# Patient Record
Sex: Female | Born: 1950
Health system: Southern US, Community
[De-identification: ages and names within clinical notes are randomized; demographics above are authoritative.]

## PROBLEM LIST (undated history)

## (undated) DIAGNOSIS — Z9989 Dependence on other enabling machines and devices: Secondary | ICD-10-CM

## (undated) DIAGNOSIS — Z950 Presence of cardiac pacemaker: Secondary | ICD-10-CM

## (undated) DIAGNOSIS — Z8489 Family history of other specified conditions: Secondary | ICD-10-CM

## (undated) DIAGNOSIS — G510 Bell's palsy: Secondary | ICD-10-CM

## (undated) DIAGNOSIS — G43909 Migraine, unspecified, not intractable, without status migrainosus: Secondary | ICD-10-CM

## (undated) DIAGNOSIS — M1990 Unspecified osteoarthritis, unspecified site: Secondary | ICD-10-CM

## (undated) DIAGNOSIS — R55 Syncope and collapse: Secondary | ICD-10-CM

## (undated) DIAGNOSIS — G4733 Obstructive sleep apnea (adult) (pediatric): Secondary | ICD-10-CM

## (undated) DIAGNOSIS — K219 Gastro-esophageal reflux disease without esophagitis: Secondary | ICD-10-CM

## (undated) DIAGNOSIS — I5032 Chronic diastolic (congestive) heart failure: Secondary | ICD-10-CM

## (undated) DIAGNOSIS — Z8719 Personal history of other diseases of the digestive system: Secondary | ICD-10-CM

## (undated) DIAGNOSIS — I10 Essential (primary) hypertension: Secondary | ICD-10-CM

## (undated) DIAGNOSIS — I442 Atrioventricular block, complete: Secondary | ICD-10-CM

## (undated) DIAGNOSIS — B029 Zoster without complications: Secondary | ICD-10-CM

## (undated) DIAGNOSIS — Z87442 Personal history of urinary calculi: Secondary | ICD-10-CM

## (undated) DIAGNOSIS — E039 Hypothyroidism, unspecified: Secondary | ICD-10-CM

## (undated) HISTORY — PX: DILATION AND CURETTAGE OF UTERUS: SHX78

## (undated) HISTORY — PX: BACK SURGERY: SHX140

## (undated) HISTORY — PX: LITHOTRIPSY: SUR834

## (undated) HISTORY — PX: PARATHYROIDECTOMY: SHX19

## (undated) HISTORY — PX: JOINT REPLACEMENT: SHX530

## (undated) HISTORY — PX: TUBAL LIGATION: SHX77

## (undated) HISTORY — PX: LAPAROSCOPIC CHOLECYSTECTOMY: SUR755

## (undated) HISTORY — PX: CARDIAC CATHETERIZATION: SHX172

## (undated) HISTORY — PX: LUMBAR DISC SURGERY: SHX700

## (undated) HISTORY — PX: BREAST BIOPSY: SHX20

## (undated) HISTORY — PX: INSERT / REPLACE / REMOVE PACEMAKER: SUR710

---

## 1994-08-03 HISTORY — PX: VAGINAL HYSTERECTOMY: SUR661

## 1998-09-27 ENCOUNTER — Encounter: Payer: Self-pay | Admitting: Emergency Medicine

## 1998-09-27 ENCOUNTER — Emergency Department (HOSPITAL_COMMUNITY): Admission: EM | Admit: 1998-09-27 | Discharge: 1998-09-27 | Payer: Self-pay | Admitting: Emergency Medicine

## 1999-03-11 ENCOUNTER — Encounter: Payer: Self-pay | Admitting: Internal Medicine

## 1999-03-11 ENCOUNTER — Ambulatory Visit (HOSPITAL_COMMUNITY): Admission: RE | Admit: 1999-03-11 | Discharge: 1999-03-11 | Payer: Self-pay | Admitting: Internal Medicine

## 2000-03-05 ENCOUNTER — Emergency Department (HOSPITAL_COMMUNITY): Admission: EM | Admit: 2000-03-05 | Discharge: 2000-03-05 | Payer: Self-pay | Admitting: Emergency Medicine

## 2001-10-27 ENCOUNTER — Emergency Department (HOSPITAL_COMMUNITY): Admission: EM | Admit: 2001-10-27 | Discharge: 2001-10-27 | Payer: Self-pay | Admitting: Emergency Medicine

## 2001-10-27 ENCOUNTER — Encounter: Payer: Self-pay | Admitting: Emergency Medicine

## 2002-08-04 ENCOUNTER — Other Ambulatory Visit (HOSPITAL_COMMUNITY): Admission: RE | Admit: 2002-08-04 | Discharge: 2002-08-25 | Payer: Self-pay | Admitting: *Deleted

## 2002-10-10 ENCOUNTER — Emergency Department (HOSPITAL_COMMUNITY): Admission: EM | Admit: 2002-10-10 | Discharge: 2002-10-10 | Payer: Self-pay | Admitting: Emergency Medicine

## 2002-10-10 ENCOUNTER — Encounter: Payer: Self-pay | Admitting: Emergency Medicine

## 2004-01-17 ENCOUNTER — Encounter: Admission: RE | Admit: 2004-01-17 | Discharge: 2004-01-17 | Payer: Self-pay | Admitting: Internal Medicine

## 2004-02-14 ENCOUNTER — Encounter (INDEPENDENT_AMBULATORY_CARE_PROVIDER_SITE_OTHER): Payer: Self-pay | Admitting: *Deleted

## 2004-02-14 ENCOUNTER — Encounter: Admission: RE | Admit: 2004-02-14 | Discharge: 2004-02-14 | Payer: Self-pay | Admitting: Internal Medicine

## 2004-03-07 ENCOUNTER — Encounter: Admission: RE | Admit: 2004-03-07 | Discharge: 2004-03-07 | Payer: Self-pay | Admitting: Internal Medicine

## 2004-03-07 ENCOUNTER — Encounter (INDEPENDENT_AMBULATORY_CARE_PROVIDER_SITE_OTHER): Payer: Self-pay | Admitting: *Deleted

## 2004-11-23 ENCOUNTER — Ambulatory Visit (HOSPITAL_COMMUNITY): Admission: RE | Admit: 2004-11-23 | Discharge: 2004-11-23 | Payer: Self-pay | Admitting: Family Medicine

## 2004-11-23 ENCOUNTER — Emergency Department (HOSPITAL_COMMUNITY): Admission: EM | Admit: 2004-11-23 | Discharge: 2004-11-23 | Payer: Self-pay | Admitting: Family Medicine

## 2004-11-27 ENCOUNTER — Ambulatory Visit (HOSPITAL_COMMUNITY): Admission: RE | Admit: 2004-11-27 | Discharge: 2004-11-27 | Payer: Self-pay | Admitting: Urology

## 2005-03-20 ENCOUNTER — Encounter: Admission: RE | Admit: 2005-03-20 | Discharge: 2005-03-20 | Payer: Self-pay | Admitting: Internal Medicine

## 2006-01-11 ENCOUNTER — Encounter: Admission: RE | Admit: 2006-01-11 | Discharge: 2006-01-11 | Payer: Self-pay | Admitting: Internal Medicine

## 2006-03-25 ENCOUNTER — Encounter: Admission: RE | Admit: 2006-03-25 | Discharge: 2006-03-25 | Payer: Self-pay | Admitting: Internal Medicine

## 2006-03-31 ENCOUNTER — Encounter: Admission: RE | Admit: 2006-03-31 | Discharge: 2006-03-31 | Payer: Self-pay | Admitting: Internal Medicine

## 2007-04-11 ENCOUNTER — Encounter: Admission: RE | Admit: 2007-04-11 | Discharge: 2007-04-11 | Payer: Self-pay | Admitting: Sports Medicine

## 2007-04-14 ENCOUNTER — Encounter: Admission: RE | Admit: 2007-04-14 | Discharge: 2007-04-14 | Payer: Self-pay | Admitting: Sports Medicine

## 2007-04-19 ENCOUNTER — Encounter: Admission: RE | Admit: 2007-04-19 | Discharge: 2007-04-19 | Payer: Self-pay | Admitting: Internal Medicine

## 2007-04-28 ENCOUNTER — Encounter: Admission: RE | Admit: 2007-04-28 | Discharge: 2007-04-28 | Payer: Self-pay | Admitting: Sports Medicine

## 2007-05-20 ENCOUNTER — Encounter: Admission: RE | Admit: 2007-05-20 | Discharge: 2007-05-20 | Payer: Self-pay | Admitting: Sports Medicine

## 2007-09-14 ENCOUNTER — Ambulatory Visit (HOSPITAL_COMMUNITY): Admission: RE | Admit: 2007-09-14 | Discharge: 2007-09-15 | Payer: Self-pay | Admitting: Neurosurgery

## 2008-08-15 ENCOUNTER — Inpatient Hospital Stay (HOSPITAL_BASED_OUTPATIENT_CLINIC_OR_DEPARTMENT_OTHER): Admission: RE | Admit: 2008-08-15 | Discharge: 2008-08-15 | Payer: Self-pay | Admitting: Cardiovascular Disease

## 2009-01-31 ENCOUNTER — Encounter: Admission: RE | Admit: 2009-01-31 | Discharge: 2009-01-31 | Payer: Self-pay | Admitting: Internal Medicine

## 2009-10-20 ENCOUNTER — Emergency Department (HOSPITAL_COMMUNITY): Admission: EM | Admit: 2009-10-20 | Discharge: 2009-10-20 | Payer: Self-pay | Admitting: Family Medicine

## 2010-02-27 ENCOUNTER — Encounter: Admission: RE | Admit: 2010-02-27 | Discharge: 2010-02-27 | Payer: Self-pay | Admitting: Internal Medicine

## 2010-08-24 ENCOUNTER — Encounter: Payer: Self-pay | Admitting: Internal Medicine

## 2010-08-25 ENCOUNTER — Encounter: Payer: Self-pay | Admitting: Internal Medicine

## 2010-12-16 NOTE — Op Note (Signed)
[name], Kelly Castaneda NO.:  [id number]   MEDICAL RECORD NO.:  [id number]          PATIENT TYPE:  OIB   LOCATION:  3599                         FACILITY:  [facility]   PHYSICIAN:  Clydene Fake, M.D.  DATE OF BIRTH:  [birth year]   DATE OF PROCEDURE:  09/14/2007  DATE OF DISCHARGE:                               OPERATIVE REPORT   PREOPERATIVE DIAGNOSIS:  Lumbar stenosis, spondylosis, herniated nucleus  pulposus, L4-L5 with left sided radiculopathy.   POSTOPERATIVE DIAGNOSIS:  Lumbar stenosis, spondylosis, herniated  nucleus pulposus, L4-L5 with left sided radiculopathy.   PROCEDURE:  Left L4-L5 decompressive laminectomy, discectomy,  microdissection with microscope.   SURGEON:  Clydene Fake, M.D.   ASSISTANT:  Hewitt Shorts, M.D.   ANESTHESIA:  General endotracheal tube anesthesia.   ESTIMATED BLOOD LOSS:  Minimal.   BLOOD GIVEN:  None.   DRAINS:  None.   COMPLICATIONS:  None.   INDICATIONS FOR PROCEDURE:  The patient is a 60 year old woman who has  had left sided back and leg pain that has been going on since August.  She had epidural injections which helped briefly but for the last two  months, she has had severe pain radiating down the leg with some  tingling, denies weakness, straight leg raise positive, sensation was  intact.  An MRI was done showing lumbar spinal change with severe  stenosis with a large central to left sided disc herniation.  The  patient was brought for decompression.   PROCEDURE IN DETAIL:  The patient was brought to the operating room,  general anesthesia induced, the patient was placed in the prone position  on a Wilson frame with all pressure points padded.  The patient was  prepped and draped in a sterile fashion.  The site of incision was  injected with 20 mL of 1% lidocaine with epinephrine.  An incision was  then made in the midline of the lower lumbar spine.  The incision was  taken to the fascia.  Hemostasis was  obtained with Bovie cauterization.  On the left side, subperiosteal dissection was done over the L4 and L5  spinous process. A marker was placed in the interspace.  An x-ray was  obtained showing this was the L4-L5 space.  A self-retaining retractor  was placed and a high speed drill was used to start a decompressive  laminectomy on the left side at L4-L5.  This was completed with Kerrison  punches.  The ligamentum flavum was removed decompressing the central  canal and medial facetectomy was performed and foraminotomy done at the  L5 roots.  I then explored the disc space and found large pieces of  fragments medially.  The disc space was incised and discectomy was done  with pituitary rongeurs and curettes.  When we were finished, we freed  up the free fragments and removed those.  We had a good discectomy,  decompressing the central canal, relieving the stenosis.  Hemostasis was  obtained with bipolar cauterization.  We irrigated with antibiotic  solution, had good hemostasis, and the retractors were removed.  The  fascia was  closed with 0 Vicryl interrupted sutures, the subcutaneous  tissue closed with 0, 2-0, and 3-0 Vicryl interrupted sutures, and the  wound covered with Benzoin and Steri-Strips.  The discectomy and  decompression was performed with the assistance of the microscope.           ______________________________  Clydene Fake, M.D.     JRH/MEDQ  D:  09/14/2007  T:  09/15/2007  Job:  [id number]

## 2010-12-16 NOTE — Cardiovascular Report (Signed)
NAMEHALAH, Kelly Castaneda NO.:  [id number]   MEDICAL RECORD NO.:  [id number]          PATIENT TYPE:  OIB   LOCATION:  1962                         FACILITY:  [facility]   PHYSICIAN:  [name], MDDATE OF BIRTH:  03-Sep-1950   DATE OF PROCEDURE:  08/15/2008  DATE OF DISCHARGE:  08/15/2008                            CARDIAC CATHETERIZATION   REFERRING PHYSICIAN:  Candyce Churn, MD   PROCEDURES PERFORMED:  Left heart catheterization, left ventriculogram,  coronary angiogram, and abdominal aortogram.   OPERATOR:  [name], MD   INDICATIONS:  Congestive heart failure and abnormal stress test.   PROCEDURE NARRATIVE:  The risks and benefits of cardiac catheterization  were explained to the patient and informed consent was obtained.  She  was brought to the Cath Lab.  She was prepped and draped in the usual  sterile fashion.  Her right groin was infiltrated with 1% lidocaine.  A  4-French sheath was placed into the right femoral artery using the  modified Seldinger technique.  Left coronary artery angiography was  performed using a JL-4.0 catheter.  The catheter was advanced to the  vessel ostium under fluoroscopic guidance.  Digital angiography was  performed in multiple projections using hand injection of contrast.  Right coronary artery angiography was performed using a 3DRC catheter.  The catheter was advanced to the vessel ostium under fluoroscopic  guidance.  Digital angiography was performed in multiple projections  using hand injection of contrast.  A pigtail catheter was advanced to  the ascending aorta and across the aortic valve under fluoroscopic  guidance.  A power injection of contrast was performed in the RAO  projection to image the left ventricle.  The catheter was pulled back  under continuous hemodynamic pressure monitoring.  The catheter was  withdrawn to the abdominal aorta.  Power injection of contrast was  performed in the AP  projection to image the abdominal aorta.  The sheath  was removed using manual compression.   FINDINGS:  The left main was widely patent.   The left circumflex was a medium-sized vessel.  There was a large OM-1  which had mild irregularities which were most significant at the very  distal portion of the vessel.  The OM-2 was a small vessel and patent.   Left anterior descending was a large vessel.  There was a 25% lesion in  the mid vessel at the origin of the first diagonal.  There were minimal  irregularities throughout the remainder of the LAD.  The first diagonal  was a medium-sized vessel.  There was a proximal 50-60% lesion.  The  second diagonal was a medium-sized vessel and widely patent.   The right coronary artery was a large dominant vessel with mild luminal  irregularities.  The PDA was a large vessel which reached the apex.  The  posterolateral artery was a medium-sized vessel.   Left ventriculogram showed global diffuse left ventricular hypokinesis  with an LVEF of 25-30%.   Abdominal aortogram showed no abdominal aortic aneurysm.  There were  bilateral single renal arteries both of which were widely  patent.   HEMODYNAMICS:  Left ventricular pressure of 99/21 with an LVEDP of 18  mmHg.  Aortic pressure of 98/61 with a mean aortic pressure of 77 mmHg.   IMPRESSION:  1. No significant coronary artery disease to explain her decreased      left ventricular function.  2. Nonischemic cardiomyopathy with an estimated ejection fraction of      25-30%.  3. No abdominal aortic aneurysm or renal artery stenosis.   RECOMMENDATIONS:  Continue medical therapy including ACE inhibitor, beta-  blocker, and diuretic.  We will have to consider adding spironolactone  to the regimen.  Of note, the patient received 1 mg of Versed and 25 mcg  of fentanyl at the beginning of the procedure and she did drop her  pressure to the 70 systolic for a short period of time.  This resolved   spontaneously.      [name], MD  Electronically Signed     JSV/MEDQ  D:  08/15/2008  T:  08/15/2008  Job:  [id number]

## 2011-03-11 ENCOUNTER — Other Ambulatory Visit: Payer: Self-pay | Admitting: Internal Medicine

## 2011-03-11 DIAGNOSIS — Z1231 Encounter for screening mammogram for malignant neoplasm of breast: Secondary | ICD-10-CM

## 2011-03-17 ENCOUNTER — Ambulatory Visit: Payer: Self-pay

## 2011-03-26 ENCOUNTER — Ambulatory Visit
Admission: RE | Admit: 2011-03-26 | Discharge: 2011-03-26 | Disposition: A | Discharge status: Home / Self Care | Payer: Self-pay | Source: Ambulatory Visit | Attending: Internal Medicine | Admitting: Internal Medicine

## 2011-03-26 DIAGNOSIS — Z1231 Encounter for screening mammogram for malignant neoplasm of breast: Secondary | ICD-10-CM

## 2011-04-24 LAB — CBC
HCT: 38.8
Hemoglobin: 13.2
MCHC: 34
MCV: 90.5
WBC: 7.6

## 2011-04-24 LAB — BASIC METABOLIC PANEL
BUN: 13
Calcium: 9.7
Creatinine, Ser: 0.68
GFR calc Af Amer: >60
GFR calc non Af Amer: >60
Sodium: 138

## 2011-04-24 LAB — PROTIME-INR: INR: 0.8

## 2011-08-16 ENCOUNTER — Encounter (HOSPITAL_COMMUNITY): Payer: Self-pay

## 2011-08-16 ENCOUNTER — Emergency Department (HOSPITAL_COMMUNITY)
Admission: EM | Admit: 2011-08-16 | Discharge: 2011-08-16 | Disposition: A | Discharge status: Home / Self Care | Payer: Self-pay | Attending: Emergency Medicine | Admitting: Emergency Medicine

## 2011-08-16 DIAGNOSIS — J40 Bronchitis, not specified as acute or chronic: Secondary | ICD-10-CM | POA: Insufficient documentation

## 2011-08-16 DIAGNOSIS — R059 Cough, unspecified: Secondary | ICD-10-CM | POA: Insufficient documentation

## 2011-08-16 DIAGNOSIS — R05 Cough: Secondary | ICD-10-CM | POA: Insufficient documentation

## 2011-08-16 DIAGNOSIS — E669 Obesity, unspecified: Secondary | ICD-10-CM | POA: Insufficient documentation

## 2011-08-16 DIAGNOSIS — I509 Heart failure, unspecified: Secondary | ICD-10-CM | POA: Insufficient documentation

## 2011-08-16 DIAGNOSIS — R062 Wheezing: Secondary | ICD-10-CM | POA: Insufficient documentation

## 2011-08-16 DIAGNOSIS — I10 Essential (primary) hypertension: Secondary | ICD-10-CM | POA: Insufficient documentation

## 2011-08-16 DIAGNOSIS — Z79899 Other long term (current) drug therapy: Secondary | ICD-10-CM | POA: Insufficient documentation

## 2011-08-16 MED ORDER — AZITHROMYCIN 250 MG PO TABS: ORAL_TABLET | ORAL | Status: AC

## 2011-08-16 MED ORDER — ALBUTEROL SULFATE HFA 108 (90 BASE) MCG/ACT IN AERS: 1.0000 | INHALATION_SPRAY | Freq: Four times a day (QID) | RESPIRATORY_TRACT | Status: DC | PRN

## 2011-08-16 MED ORDER — HYDROCOD POLST-CHLORPHEN POLST 10-8 MG/5ML PO LQCR
5.0000 mL | Freq: Two times a day (BID) | ORAL | Status: DC | PRN
Start: 2011-08-16 — End: 2012-11-29

## 2011-08-16 NOTE — ED Provider Notes (Signed)
History     CSN: [id number]  Arrival date & time 08/16/11  1143   First MD Initiated Contact with Patient 08/16/11 1204      Chief Complaint  Patient presents with  . Cough    (Consider location/radiation/quality/duration/timing/severity/associated sxs/prior treatment) HPI... cough for 2 weeks. Patient has picked up a bad chest cold. Slight wheezing. No fever sweats or chills. Nothing makes it better or worse. Symptoms are moderate.  Past Medical History  Diagnosis Date  . Hypertension   . CHF (congestive heart failure)     Past Surgical History  Procedure Date  . Abdominal hysterectomy   . Cholecystectomy   . Tubal ligation   . Dilation and curettage of uterus     No family history on file.  History  Substance Use Topics  . Smoking status: Never Smoker   . Smokeless tobacco: Not on file  . Alcohol Use: No    OB History    Grav Para Term Preterm Abortions TAB SAB Ect Mult Living                  Review of Systems  All other systems reviewed and are negative.    Allergies  Codeine  Home Medications   Current Outpatient Rx  Name Route Sig Dispense Refill  . CARVEDILOL 25 MG PO TABS Oral Take 25 mg by mouth 2 (two) times daily with a meal.    . DM-GUAIFENESIN ER 30-600 MG PO TB12 Oral Take 1 tablet by mouth every 12 (twelve) hours.    . FUROSEMIDE 80 MG PO TABS Oral Take 80 mg by mouth daily.    . COUGH SYRUP PO Oral Take 30 mLs by mouth every 4 (four) hours as needed. For cough.    Marland Kitchen LOSARTAN POTASSIUM 100 MG PO TABS Oral Take 100 mg by mouth daily.    . VENLAFAXINE HCL 75 MG PO TABS Oral Take 75 mg by mouth 2 (two) times daily.    . ALBUTEROL SULFATE HFA 108 (90 BASE) MCG/ACT IN AERS Inhalation Inhale 1-2 puffs into the lungs every 6 (six) hours as needed for wheezing. 1 Inhaler 0  . AZITHROMYCIN 250 MG PO TABS  2 po day one, then 1 daily x 4 days 5 tablet 0  . HYDROCOD POLST-CPM POLST ER 10-8 MG/5ML PO LQCR Oral Take 5 mLs by mouth every 12 (twelve)  hours as needed. 120 mL 0    BP 143/77  Pulse 120  Temp(Src) 98.9 F (37.2 C) (Oral)  Resp 23  SpO2 94%  Physical Exam  Nursing note and vitals reviewed. Constitutional: She is oriented to person, place, and time. She appears well-developed and well-nourished.       Obese  HENT:  Head: Normocephalic and atraumatic.  Eyes: Conjunctivae and EOM are normal. Pupils are equal, round, and reactive to light.  Neck: Normal range of motion. Neck supple.  Cardiovascular: Normal rate and regular rhythm.   Pulmonary/Chest: Effort normal and breath sounds normal.  Abdominal: Soft. Bowel sounds are normal.  Musculoskeletal: Normal range of motion.  Neurological: She is alert and oriented to person, place, and time.  Skin: Skin is warm and dry.  Psychiatric: She has a normal mood and affect.    ED Course  Procedures (including critical care time)  Labs Reviewed - No data to display No results found.   1. Bronchitis       MDM  Patient is in no respiratory distress. History and physical suggestive of upper respiratory  infection. Because symptoms have been for 2 weeks, I have a concern about mycoplasma. Will Rx Zithromax. I do not think this is congestive heart failure.       Donnetta Hutching, MD 08/16/11 1246

## 2011-08-16 NOTE — ED Notes (Signed)
Pt with c/o cough for 3 weeks, nasal congestion is better but ears stopped up, DOE

## 2012-11-29 ENCOUNTER — Emergency Department (HOSPITAL_COMMUNITY)
Admission: EM | Admit: 2012-11-29 | Discharge: 2012-11-29 | Disposition: A | Discharge status: Home / Self Care | Payer: No Typology Code available for payment source | Attending: Emergency Medicine | Admitting: Emergency Medicine

## 2012-11-29 ENCOUNTER — Emergency Department (HOSPITAL_COMMUNITY): Payer: No Typology Code available for payment source

## 2012-11-29 ENCOUNTER — Encounter (HOSPITAL_COMMUNITY): Payer: Self-pay | Admitting: Emergency Medicine

## 2012-11-29 DIAGNOSIS — M25469 Effusion, unspecified knee: Secondary | ICD-10-CM | POA: Insufficient documentation

## 2012-11-29 DIAGNOSIS — Z79899 Other long term (current) drug therapy: Secondary | ICD-10-CM | POA: Insufficient documentation

## 2012-11-29 DIAGNOSIS — Y92410 Unspecified street and highway as the place of occurrence of the external cause: Secondary | ICD-10-CM | POA: Insufficient documentation

## 2012-11-29 DIAGNOSIS — IMO0002 Reserved for concepts with insufficient information to code with codable children: Secondary | ICD-10-CM | POA: Insufficient documentation

## 2012-11-29 DIAGNOSIS — I10 Essential (primary) hypertension: Secondary | ICD-10-CM | POA: Insufficient documentation

## 2012-11-29 DIAGNOSIS — Y9389 Activity, other specified: Secondary | ICD-10-CM | POA: Insufficient documentation

## 2012-11-29 DIAGNOSIS — I509 Heart failure, unspecified: Secondary | ICD-10-CM | POA: Insufficient documentation

## 2012-11-29 DIAGNOSIS — R29818 Other symptoms and signs involving the nervous system: Secondary | ICD-10-CM | POA: Insufficient documentation

## 2012-11-29 DIAGNOSIS — S8002XA Contusion of left knee, initial encounter: Secondary | ICD-10-CM

## 2012-11-29 DIAGNOSIS — S8000XA Contusion of unspecified knee, initial encounter: Secondary | ICD-10-CM | POA: Insufficient documentation

## 2012-11-29 MED ORDER — HYDROCODONE-ACETAMINOPHEN 5-325 MG PO TABS: 1.0000 | ORAL_TABLET | ORAL | Status: DC | PRN

## 2012-11-29 MED ORDER — NAPROXEN 500 MG PO TABS: 500.0000 mg | ORAL_TABLET | Freq: Two times a day (BID) | ORAL | Status: DC
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
## 2012-11-30 NOTE — ED Provider Notes (Signed)
Medical screening examination/treatment/procedure(s) were performed by non-physician practitioner and as supervising physician I was immediately available for consultation/collaboration.  Donnetta Hutching, MD 11/30/12 2019

## 2012-12-20 ENCOUNTER — Other Ambulatory Visit: Payer: Self-pay

## 2012-12-20 DIAGNOSIS — Z1231 Encounter for screening mammogram for malignant neoplasm of breast: Secondary | ICD-10-CM

## 2013-01-20 ENCOUNTER — Ambulatory Visit
Admission: RE | Admit: 2013-01-20 | Discharge: 2013-01-20 | Disposition: A | Discharge status: Home / Self Care | Payer: BC Managed Care – PPO | Source: Ambulatory Visit

## 2013-01-20 DIAGNOSIS — Z1231 Encounter for screening mammogram for malignant neoplasm of breast: Secondary | ICD-10-CM

## 2013-02-01 ENCOUNTER — Emergency Department (HOSPITAL_COMMUNITY): Payer: BC Managed Care – PPO

## 2013-02-01 ENCOUNTER — Encounter (HOSPITAL_COMMUNITY): Payer: Self-pay | Admitting: *Deleted

## 2013-02-01 ENCOUNTER — Emergency Department (HOSPITAL_COMMUNITY)
Admission: EM | Admit: 2013-02-01 | Discharge: 2013-02-01 | Disposition: A | Discharge status: Home / Self Care | Payer: BC Managed Care – PPO | Attending: Emergency Medicine | Admitting: Emergency Medicine

## 2013-02-01 DIAGNOSIS — I10 Essential (primary) hypertension: Secondary | ICD-10-CM | POA: Insufficient documentation

## 2013-02-01 DIAGNOSIS — Z79899 Other long term (current) drug therapy: Secondary | ICD-10-CM | POA: Insufficient documentation

## 2013-02-01 DIAGNOSIS — Z8639 Personal history of other endocrine, nutritional and metabolic disease: Secondary | ICD-10-CM | POA: Insufficient documentation

## 2013-02-01 DIAGNOSIS — IMO0002 Reserved for concepts with insufficient information to code with codable children: Secondary | ICD-10-CM

## 2013-02-01 DIAGNOSIS — I509 Heart failure, unspecified: Secondary | ICD-10-CM | POA: Insufficient documentation

## 2013-02-01 DIAGNOSIS — Z8619 Personal history of other infectious and parasitic diseases: Secondary | ICD-10-CM | POA: Insufficient documentation

## 2013-02-01 DIAGNOSIS — R55 Syncope and collapse: Secondary | ICD-10-CM | POA: Insufficient documentation

## 2013-02-01 DIAGNOSIS — Z8669 Personal history of other diseases of the nervous system and sense organs: Secondary | ICD-10-CM | POA: Insufficient documentation

## 2013-02-01 DIAGNOSIS — Z862 Personal history of diseases of the blood and blood-forming organs and certain disorders involving the immune mechanism: Secondary | ICD-10-CM | POA: Insufficient documentation

## 2013-02-01 HISTORY — DX: Bell's palsy: G51.0

## 2013-02-01 HISTORY — DX: Zoster without complications: B02.9

## 2013-02-01 LAB — URINE MICROSCOPIC-ADD ON

## 2013-02-01 LAB — CBC WITH DIFFERENTIAL/PLATELET
Basophils Relative: 0 % (ref 0–1)
Eosinophils Absolute: 0.1 10*3/uL (ref 0.0–0.7)
Eosinophils Relative: 1 % (ref 0–5)
HCT: 39.7 % (ref 36.0–46.0)
Hemoglobin: 13.8 g/dL (ref 12.0–15.0)
Lymphs Abs: 1.9 10*3/uL (ref 0.7–4.0)
MCH: 32 pg (ref 26.0–34.0)
MCHC: 34.8 g/dL (ref 30.0–36.0)
MCV: 92.1 fL (ref 78.0–100.0)
Monocytes Absolute: 0.8 10*3/uL (ref 0.1–1.0)
Monocytes Relative: 6 % (ref 3–12)
RBC: 4.31 MIL/uL (ref 3.87–5.11)

## 2013-02-01 LAB — URINALYSIS, ROUTINE W REFLEX MICROSCOPIC
Bilirubin Urine: NEGATIVE
Glucose, UA: NEGATIVE mg/dL
Hgb urine dipstick: NEGATIVE
Protein, ur: 30 mg/dL — AB
Urobilinogen, UA: 0.2 mg/dL (ref 0.0–1.0)

## 2013-02-01 LAB — POCT I-STAT, CHEM 8
BUN: 34 mg/dL — ABNORMAL HIGH (ref 6–23)
Calcium, Ion: 1.26 mmol/L (ref 1.13–1.30)
Chloride: 104 mEq/L (ref 96–112)
HCT: 42 % (ref 36.0–46.0)
Sodium: 141 mEq/L (ref 135–145)
TCO2: 27 mmol/L (ref 0–100)

## 2013-02-01 LAB — POCT I-STAT TROPONIN I: Troponin i, poc: 0.03 ng/mL (ref 0.00–0.08)

## 2013-02-01 MED ORDER — SODIUM CHLORIDE 0.9 % IV BOLUS (SEPSIS)
500.0000 mL | INTRAVENOUS | Status: AC
  Administered 2013-02-01: 500 mL via INTRAVENOUS

## 2013-02-01 NOTE — ED Notes (Signed)
Pt ambulated from her room to Pod B around Pod A back to her room and tolerated well. Oxygen level maintained at 100% during ambulation.

## 2013-02-01 NOTE — ED Notes (Signed)
Family at bedside. 

## 2013-02-01 NOTE — ED Provider Notes (Signed)
History    CSN: [id number] Arrival date & time 02/01/13  1357  First MD Initiated Contact with Patient 02/01/13 1510     Chief Complaint  Patient presents with  . Loss of Consciousness   (Consider location/radiation/quality/duration/timing/severity/associated sxs/prior Treatment) HPI Patient presents to the emergency department following a syncopal episode that occurred earlier this afternoon.  The patient, states she had lunch and then stood up and felt weak and dizzy, states, and she went to lay down and she got her bedroom where she passed out.  Patient, states she does not have chest pain, shortness of breath, nausea, vomiting, blurred vision, headache, fever, cough, abdominal pain, back pain, numbness, or rash.  Patient, states, that she's not having any problems or symptoms at this time.  Patient, states, that she's never had syncope before.  Patient denies being out in the heat      Past Medical History  Diagnosis Date  . Hypertension   . CHF (congestive heart failure)   . Thyroid disease   . Bell's palsy   . Shingles    Past Surgical History  Procedure Laterality Date  . Abdominal hysterectomy    . Cholecystectomy    . Tubal ligation    . Dilation and curettage of uterus     History reviewed. No pertinent family history. History  Substance Use Topics  . Smoking status: Never Smoker   . Smokeless tobacco: Not on file  . Alcohol Use: No   OB History   Grav Para Term Preterm Abortions TAB SAB Ect Mult Living                 Review of Systems All other systems negative except as documented in the HPI. All pertinent positives and negatives as reviewed in the HPI. Allergies  Codeine  Home Medications   Current Outpatient Rx  Name  Route  Sig  Dispense  Refill  . carvedilol (COREG) 25 MG tablet   Oral   Take 25 mg by mouth 2 (two) times daily with a meal.         . Cholecalciferol (VITAMIN D) 2000 UNITS CAPS   Oral   Take 1 capsule by mouth daily.        . furosemide (LASIX) 80 MG tablet   Oral   Take 80 mg by mouth daily as needed.          Marland Kitchen losartan (COZAAR) 100 MG tablet   Oral   Take 100 mg by mouth daily.         . naproxen sodium (ANAPROX) 220 MG tablet   Oral   Take 220 mg by mouth daily.          Marland Kitchen omeprazole (PRILOSEC) 20 MG capsule   Oral   Take 20 mg by mouth daily as needed. For heartburn          BP 111/57  Pulse 64  Temp(Src) 97.4 F (36.3 C) (Oral)  Resp 16  Ht 5\' 5"  (1.651 m)  Wt 235 lb (106.595 kg)  BMI 39.11 kg/m2  SpO2 100% Physical Exam  Nursing note and vitals reviewed. Constitutional: She is oriented to person, place, and time. She appears well-developed and well-nourished. No distress.  HENT:  Head: Normocephalic and atraumatic.  Mouth/Throat: Oropharynx is clear and moist.  Eyes: EOM are normal. Pupils are equal, round, and reactive to light.  Neck: Normal range of motion. Neck supple.  Cardiovascular: Normal rate, regular rhythm and normal heart sounds.  Exam  reveals no gallop and no friction rub.   No murmur heard. Pulmonary/Chest: Effort normal and breath sounds normal. No respiratory distress.  Musculoskeletal: She exhibits no edema.  Neurological: She is alert and oriented to person, place, and time. She exhibits normal muscle tone. Coordination normal.  Skin: Skin is warm and dry. No rash noted.    ED Course  Procedures (including critical care time) Labs Reviewed  CBC WITH DIFFERENTIAL - Abnormal; Notable for the following:    WBC 12.3 (*)    Neutro Abs 9.5 (*)    All other components within normal limits  URINALYSIS, ROUTINE W REFLEX MICROSCOPIC - Abnormal; Notable for the following:    Protein, ur 30 (*)    All other components within normal limits  URINE MICROSCOPIC-ADD ON - Abnormal; Notable for the following:    Squamous Epithelial / LPF MANY (*)    Bacteria, UA FEW (*)    Casts GRANULAR CAST (*)    All other components within normal limits  POCT I-STAT, CHEM 8  - Abnormal; Notable for the following:    BUN 34 (*)    Glucose, Bld 130 (*)    All other components within normal limits  POCT I-STAT TROPONIN I   Dg Chest 2 View  02/01/2013   *RADIOLOGY REPORT*  Clinical Data: Syncope.  CHEST - 2 VIEW  Comparison: 09/14/2007 and 01/11/2006  Findings: Heart size and pulmonary vascularity are normal and the lungs are clear.  Chronic elevation of the anterior aspect of the right hemidiaphragm.  No acute osseous abnormality.  IMPRESSION: No acute abnormalities.   Original Report Authenticated By: Francene Boyers, M.D.   patient hasn't noted.  New, right bundle branch block, from the previous EKG that we have on file position 2009.  Patient, states she's had EKGs done since that time, but we do not have access.  I spoke with the cardiologist about the patient's EKG changes.  He felt that based on her other lab testing, and the fact that she is not feeling symptoms.  She could go home with followup.  The patient, states, that she did not want to be admitted to the hospital.  She also would like to followup with her cardiologist.  Patient is advised return here for any worsening in her condition.  Advised the patient that this could be an arrhythmia , and that's why admission to the hospital is warranted.  She does not want to be admitted to the hospital   MDM  **MDM Reviewed: vitals and nursing note Interpretation: labs, ECG and x-ray   Date: 02/03/2013  Rate:58  Rhythm: sinus bradycardia  QRS Axis: normal  Intervals: normal  ST/T Wave abnormalities: normal  Conduction Disutrbances:right bundle branch block  Narrative Interpretation:   Old EKG Reviewed: changes noted      Carlyle Dolly, PA-C 02/03/13 0122  Carlyle Dolly, PA-C 02/03/13 [phone]

## 2013-02-01 NOTE — ED Notes (Signed)
Reports having syncopal episode at home, had felt lightheaded and went to lay down and had witnessed syncopal episode. ems was called, had ekg done and they checked cbg, it was 140, then pt decided to come in pov. ekg done at triage. No acute distress noted at this time.

## 2013-02-03 NOTE — ED Provider Notes (Signed)
Medical screening examination/treatment/procedure(s) were performed by non-physician practitioner and as supervising physician I was immediately available for consultation/collaboration.  Patient seen by PA but discussed with me. Patient had a syncope at home. EKG showed RBBB and previous EKG in 2009. He discussed case with cardiology fellow, who isn't concerned about the EKG changes. She has f/u and would rather f/u with her cardiology than be admitted for further workup. She request to go home.   Richardean Canal, MD 02/03/13 [phone]

## 2013-09-06 ENCOUNTER — Telehealth: Payer: Self-pay

## 2013-09-06 MED ORDER — LOSARTAN POTASSIUM 100 MG PO TABS: 100.0000 mg | ORAL_TABLET | Freq: Every day | ORAL | Status: DC

## 2013-09-06 NOTE — Telephone Encounter (Signed)
Refilled

## 2013-09-14 ENCOUNTER — Encounter (HOSPITAL_COMMUNITY): Payer: Self-pay | Admitting: Pharmacy Technician

## 2013-09-15 ENCOUNTER — Other Ambulatory Visit: Payer: Self-pay | Admitting: Orthopedic Surgery

## 2013-09-15 NOTE — Progress Notes (Signed)
Please put ordes in Epic, surgery 2-23, pre op 09-20-13 Thank you!

## 2013-09-19 ENCOUNTER — Other Ambulatory Visit: Payer: Self-pay | Admitting: Orthopedic Surgery

## 2013-09-19 ENCOUNTER — Other Ambulatory Visit (HOSPITAL_COMMUNITY): Payer: Self-pay | Admitting: *Deleted

## 2013-09-19 NOTE — H&P (Signed)
Kelly Castaneda  DOB: [birth year] Single / Language: [place] / Race: White Female  Date of Admission: 09-25-2013  Chief Complaint:  Left Knee Pain  History of Present Illness The patient is a 63 year old female who comes in for a preoperative History and Physical. The patient is scheduled for a left total knee arthroplasty to be performed by Dr. Gus Rankin. Aluisio, MD on 09-25-2013. The patient is a 63 year old female who presents with knee complaints. The patient is seen for a second opinion (for left greater than right knee pain). The patient reports left knee and right knee symptoms including: pain, instability and difficulty with stairs which began year(s) ago without any known injury. Prior to being seen, the patient was previously evaluated by a colleague (Dr. Farris Has) year(s) ago. Previous work-up for this problem has included knee x-rays. Past treatment for this problem has included intra-articular injection of corticosteroids (the last injections were done on 01-25-13). The patient was last seen in clinic 5 month(s) ago. Her knees have continued to worsen over time. She states her knee has gotten progressively worse over time. The cortisone injections did help for a while but now they do not work any longer. The knee is hurting her at all times. The knee is limiting what she can and can not do. She is at a stage here where she would like a more permanent solution to her knee. She is ready to proceed with knee replacement and will get the left side done at this time. They have been treated conservatively in the past for the above stated problem and despite conservative measures, they continue to have progressive pain and severe functional limitations and dysfunction. They have failed non-operative management including home exercise, medications, and injections. It is felt that they would benefit from undergoing total joint replacement. Risks and benefits of the procedure have been discussed  with the patient and they elect to proceed with surgery. There are no active contraindications to surgery such as ongoing infection or rapidly progressive neurological disease.  Allergies Codeine Sulfate *ANALGESICS - OPIOID*. Nausea.  Problem List/Past Medical History Primary osteoarthritis of both knees (715.16) High blood pressure Gastroesophageal Reflux Disease Migraine Headache Kidney Stone Congestive Heart Failure Anxiety Disorder Measles Mumps Scarlet Fever   Family History Depression. Sister. Congestive Heart Failure. Maternal Grandmother. Hypertension. Father, Mother. Heart disease in female family member before age 63 First Degree Relatives. reported Diabetes Mellitus. Maternal Grandfather, Mother. Heart disease in female family member before age 35 Heart Disease. Father, Maternal Grandfather, Maternal Grandmother, Paternal Grandmother.    Social History Current work status. retired Copywriter, advertising. 0 Living situation. live alone Exercise. Exercises rarely; does running / walking Tobacco use. Never smoker. 06/22/2013 Number of flights of stairs before winded. 2-3 Never consumed alcohol. 06/22/2013: Never consumed alcohol Marital status. single Not under pain contract No history of drug/alcohol rehab    Medication History Losartan Potassium ( Oral) Specific dose unknown - Active. Carvedilol ( Oral) Specific dose unknown - Active. Vitamin D ( Oral) Specific dose unknown - Active. Omeprazole ( Oral) Specific dose unknown - Active. Aleve (220MG  Tablet, Oral) Active. (prn) Furosemide ( Oral) Specific dose unknown - Active. (2-3 X per week) Aspirin Low Strength (81MG  Tablet Chewable, Oral) Active.    Past Surgical History Tubal Ligation. Date: 61. Spinal Surgery. Date: 2009. Dilation and Curettage of Uterus - Multiple Breast Biopsy. right Hysterectomy. Date: 65. partial (cancerous) Gallbladder Surgery. Date: 37.  laporoscopic Parathyroidectomy. Date: 65.   Review of Systems General:Not  Present- Chills, Fever, Night Sweats, Fatigue, Weight Gain, Weight Loss and Memory Loss. Skin:Not Present- Hives, Itching, Rash, Eczema and Lesions. HEENT:Not Present- Tinnitus, Headache, Double Vision, Visual Loss, Hearing Loss and Dentures. Respiratory:Not Present- Shortness of breath with exertion, Shortness of breath at rest, Allergies, Coughing up blood and Chronic Cough. Cardiovascular:Not Present- Chest Pain, Racing/skipping heartbeats, Difficulty Breathing Lying Down, Murmur, Swelling and Palpitations. Gastrointestinal:Not Present- Bloody Stool, Heartburn, Abdominal Pain, Vomiting, Nausea, Constipation, Diarrhea, Difficulty Swallowing, Jaundice and Loss of appetitie. Female Genitourinary:Not Present- Blood in Urine, Urinary frequency, Weak urinary stream, Discharge, Flank Pain, Incontinence, Painful Urination, Urgency, Urinary Retention and Urinating at Night. Musculoskeletal:Present- Joint Pain. Not Present- Muscle Weakness, Muscle Pain, Joint Swelling, Back Pain, Morning Stiffness and Spasms. Neurological:Not Present- Tremor, Dizziness, Blackout spells, Paralysis, Difficulty with balance and Weakness. Psychiatric:Not Present- Insomnia.    Vitals Weight: 233 lb Height: 64 in Weight was reported by patient. Height was reported by patient. Body Surface Area: 2.18 m Body Mass Index: 39.99 kg/m Pulse: 64 (Regular) Resp.: 16 (Unlabored) BP: 118/60 (Sitting, Left Arm, Standard)     Physical Exam The physical exam findings are as follows:   General Mental Status - Alert, cooperative and good historian. General Appearance- pleasant. Not in acute distress. Orientation- Oriented X3. Build & Nutrition- Well nourished and Well developed.   Head and Neck Head- normocephalic, atraumatic . Neck Global Assessment- supple. no bruit auscultated on the right and no bruit  auscultated on the left.   Eye Pupil- Bilateral- Regular and Round. Motion- Bilateral- EOMI.   ENMT partial lower denture plate  Chest and Lung Exam Auscultation: Breath sounds:- clear at anterior chest wall and - clear at posterior chest wall. Adventitious sounds:- No Adventitious sounds.   Cardiovascular Auscultation:Rhythm- Regular rate and rhythm. Heart Sounds- S1 WNL and S2 WNL. Murmurs & Other Heart Sounds:Auscultation of the heart reveals - No Murmurs.   Abdomen Inspection:Contour- Generalized moderate distention. Palpation/Percussion:Tenderness- Abdomen is non-tender to palpation. Rigidity (guarding)- Abdomen is soft. Auscultation:Auscultation of the abdomen reveals - Bowel sounds normal.   Female Genitourinary Not done, not pertinent to present illness  Musculoskeletal She is alert and oriented. No apparent distress. Evaluation of her hips show a normal range of motion. No discomfort. The right knee shows no effusion. Range is 0-130. No tenderness or instability. The left knee shows no effusion. Range is about 5-120. Varus deformity. There is tenderness medial greater than lateral with no instability noted. Pulse, sensation and motor are intact in both lower extremities.  RADIOGRAPHS: AP of both knees and lateral of the left show bone on bone arthritis in the medial and patellofemoral compartments of the left knee. The radiographs are from Dr. Blenda BridegroomKramer's office from June.   Assessment & Plan Primary osteoarthritis of both knees (715.16) Impression: Left Knee  Note: Plan is for a Left Total Knee Replacement by Dr. Lequita HaltAluisio.  [facility]  PCP - Dr. Marden Nobleobert Gates - Patient has been seen preoperatively and felt to be stable for surgery.  The patient does not have any contraindications and will receive TXA (tranexamic acid) prior to surgery.  Signed electronically by Lauraine RinneAlexzandrew L Perkins, III PA-C

## 2013-09-19 NOTE — Patient Instructions (Signed)
Dedra SkeensLoretta H Kimberlin  09/19/2013                           YOUR PROCEDURE IS SCHEDULED ON: 09/25/13               PLEASE REPORT TO SHORT STAY CENTER AT : 10:45 AM               CALL THIS NUMBER IF ANY PROBLEMS THE DAY OF SURGERY :               [phone]                                REMEMBER:   Do not eat food  AFTER MIDNIGHT   May have clear liquids UNTIL 6 HOURS BEFORE SURGERY (7:45 AM)     Take these medicines the morning of surgery with A SIP OF WATER:   Do not wear jewelry, make-up   Do not wear lotions, powders, or perfumes.   Do not shave legs or underarms 12 hrs. before surgery (men may shave face)  Do not bring valuables to the hospital.  Contacts, dentures or bridgework may not be worn into surgery.  Leave suitcase in the car. After surgery it may be brought to your room.  For patients admitted to the hospital more than one night, checkout time is 11:00 AM                                                       The day of discharge.   Patients discharged the day of surgery will not be allowed to drive home.              If going home same day of surgery, must have someone stay with you first              24 hrs at home and arrange for some one to drive you home from hospital.    Special Instructions:   Please read over the following fact sheets that you were given:               1. [facility] PREPARING FOR SURGERY SHEET                2. INCENTIVE SPIROMETER                3. MRSA INFO SHEET                                                X_____________________________________________________________________        Failure to follow these instructions may result in cancellation of your surgery

## 2013-09-20 ENCOUNTER — Inpatient Hospital Stay (HOSPITAL_COMMUNITY)
Admission: RE | Admit: 2013-09-20 | Discharge: 2013-09-20 | Disposition: A | Discharge status: Home / Self Care | Payer: BC Managed Care – PPO | Source: Ambulatory Visit

## 2013-09-20 NOTE — Patient Instructions (Signed)
      Your procedure is scheduled on:  09/25/13  MONDAY  Report to [facility] at 1045     AM.  Call this number if you have problems the morning of surgery: [phone]        Do not eat food :After Midnight. Sunday NIGHT--- MAY HAVE CLEAR LIQUIDS Monday MORNING UNTIL 0745am-  THEN NOTHING BY MOUTH   Take these medicines the morning of surgery with A SIP OF WATER: CARVEDILOL,  OMEPRAZOLE   .  Contacts, dentures or partial plates, or metal hairpins  can not be worn to surgery. Your family will be responsible for glasses, dentures, hearing aides while you are in surgery  Leave suitcase in the car. After surgery it may be brought to your room.  For patients admitted to the hospital, checkout time is 11:00 AM day of  discharge.                DO NOT WEAR JEWELRY, LOTIONS, POWDERS, OR PERFUMES.  WOMEN-- DO NOT SHAVE LEGS OR UNDERARMS FOR 48 HOURS BEFORE SHOWERS. MEN MAY SHAVE FACE.  Patients discharged the day of surgery will not be allowed to drive home. IF going home the day of surgery, you must have a driver and someone to stay with you for the first 24 hours  Name and phone number of your driver:  admission                                                                      Please read over the following fact sheets that you were given: MRSA Information, Incentive Spirometry Sheet, Blood Transfusion Sheet  Information                     FAILURE TO FOLLOW THESE INSTRUCTIONS MAY RESULT IN  CANCELLATION   OF YOUR SURGERY                                                  Patient Signature _____________________________

## 2013-09-20 NOTE — Progress Notes (Signed)
Chest x ray, ekg 7/14 EPIC, clearance with note Dr Kevan NyGates on chart

## 2013-09-21 ENCOUNTER — Other Ambulatory Visit: Payer: Self-pay | Admitting: Orthopedic Surgery

## 2013-09-21 ENCOUNTER — Encounter (HOSPITAL_COMMUNITY): Payer: Self-pay

## 2013-09-21 ENCOUNTER — Encounter (HOSPITAL_COMMUNITY)
Admission: RE | Admit: 2013-09-21 | Discharge: 2013-09-21 | Disposition: A | Discharge status: Home / Self Care | Payer: BC Managed Care – PPO | Source: Ambulatory Visit | Attending: Orthopedic Surgery | Admitting: Orthopedic Surgery

## 2013-09-21 DIAGNOSIS — Z01812 Encounter for preprocedural laboratory examination: Secondary | ICD-10-CM | POA: Insufficient documentation

## 2013-09-21 HISTORY — DX: Gastro-esophageal reflux disease without esophagitis: K21.9

## 2013-09-21 HISTORY — DX: Personal history of other diseases of the digestive system: Z87.19

## 2013-09-21 HISTORY — DX: Personal history of urinary calculi: Z87.442

## 2013-09-21 HISTORY — DX: Unspecified osteoarthritis, unspecified site: M19.90

## 2013-09-21 LAB — COMPREHENSIVE METABOLIC PANEL
ALT: 14 U/L (ref 0–35)
AST: 19 U/L (ref 0–37)
Albumin: 3.9 g/dL (ref 3.5–5.2)
Alkaline Phosphatase: 63 U/L (ref 39–117)
BUN: 16 mg/dL (ref 6–23)
CO2: 26 mEq/L (ref 19–32)
Calcium: 10.4 mg/dL (ref 8.4–10.5)
Chloride: 101 mEq/L (ref 96–112)
Creatinine, Ser: 0.8 mg/dL (ref 0.50–1.10)
GFR calc Af Amer: 88 mL/min — ABNORMAL LOW (ref >90)
GFR calc non Af Amer: 76 mL/min — ABNORMAL LOW (ref >90)
Glucose, Bld: 84 mg/dL (ref 70–99)
Potassium: 4.8 mEq/L (ref 3.7–5.3)
Sodium: 140 mEq/L (ref 137–147)
Total Bilirubin: <0.2 mg/dL — ABNORMAL LOW (ref 0.3–1.2)
Total Protein: 8.4 g/dL — ABNORMAL HIGH (ref 6.0–8.3)

## 2013-09-21 LAB — URINALYSIS, ROUTINE W REFLEX MICROSCOPIC
Bilirubin Urine: NEGATIVE
GLUCOSE, UA: NEGATIVE mg/dL
Ketones, ur: NEGATIVE mg/dL
LEUKOCYTES UA: NEGATIVE
Nitrite: NEGATIVE
Protein, ur: 100 mg/dL — AB
Specific Gravity, Urine: 1.017 (ref 1.005–1.030)
Urobilinogen, UA: 0.2 mg/dL (ref 0.0–1.0)
pH: 5.5 (ref 5.0–8.0)

## 2013-09-21 LAB — CBC
HEMATOCRIT: 43.8 % (ref 36.0–46.0)
HEMOGLOBIN: 14.7 g/dL (ref 12.0–15.0)
MCH: 31.5 pg (ref 26.0–34.0)
MCHC: 33.6 g/dL (ref 30.0–36.0)
MCV: 93.8 fL (ref 78.0–100.0)
Platelets: 203 10*3/uL (ref 150–400)
RBC: 4.67 MIL/uL (ref 3.87–5.11)
RDW: 12.9 % (ref 11.5–15.5)
WBC: 6.1 10*3/uL (ref 4.0–10.5)

## 2013-09-21 LAB — ABO/RH: ABO/RH(D): A POS

## 2013-09-21 LAB — SURGICAL PCR SCREEN
MRSA, PCR: NEGATIVE
STAPHYLOCOCCUS AUREUS: NEGATIVE

## 2013-09-21 LAB — APTT: aPTT: 27 seconds (ref 24–37)

## 2013-09-21 LAB — URINE MICROSCOPIC-ADD ON

## 2013-09-21 LAB — PROTIME-INR
INR: 0.97 (ref 0.00–1.49)
Prothrombin Time: 12.7 seconds (ref 11.6–15.2)

## 2013-09-21 NOTE — Progress Notes (Signed)
Faxed urine with micro to Dr Aluisio via EPIC 

## 2013-09-21 NOTE — Progress Notes (Signed)
09/21/13 1424  OBSTRUCTIVE SLEEP APNEA  Have you ever been diagnosed with sleep apnea through a sleep study? No  Do you snore loudly (loud enough to be heard through closed doors)?  1  Do you often feel tired, fatigued, or sleepy during the daytime? 0  Has anyone observed you stop breathing during your sleep? 0  Do you have, or are you being treated for high blood pressure? 1  BMI more than 35 kg/m2? 1  Age over 65 years old? 1  Neck circumference greater than 40 cm/18 inches? 0  Gender: 0  Obstructive Sleep Apnea Score 4  Score 4 or greater  Results sent to PCP

## 2013-09-25 ENCOUNTER — Encounter (HOSPITAL_COMMUNITY): Payer: Self-pay | Admitting: Orthopedic Surgery

## 2013-09-25 ENCOUNTER — Inpatient Hospital Stay (HOSPITAL_COMMUNITY): Payer: BC Managed Care – PPO | Admitting: Anesthesiology

## 2013-09-25 ENCOUNTER — Inpatient Hospital Stay (HOSPITAL_COMMUNITY)
Admission: RE | Admit: 2013-09-25 | Discharge: 2013-09-28 | DRG: 470 | Disposition: A | Discharge status: Skilled Nursing Facility | Payer: BC Managed Care – PPO | Source: Ambulatory Visit | Attending: Orthopedic Surgery | Admitting: Orthopedic Surgery

## 2013-09-25 ENCOUNTER — Encounter (HOSPITAL_COMMUNITY): Payer: BC Managed Care – PPO | Admitting: Anesthesiology

## 2013-09-25 ENCOUNTER — Encounter (HOSPITAL_COMMUNITY)
Admission: RE | Disposition: A | Discharge status: Skilled Nursing Facility | Payer: Self-pay | Source: Ambulatory Visit | Attending: Orthopedic Surgery

## 2013-09-25 DIAGNOSIS — Z6841 Body Mass Index (BMI) 40.0 and over, adult: Secondary | ICD-10-CM

## 2013-09-25 DIAGNOSIS — K219 Gastro-esophageal reflux disease without esophagitis: Secondary | ICD-10-CM | POA: Diagnosis present

## 2013-09-25 DIAGNOSIS — Z01812 Encounter for preprocedural laboratory examination: Secondary | ICD-10-CM

## 2013-09-25 DIAGNOSIS — I10 Essential (primary) hypertension: Secondary | ICD-10-CM | POA: Diagnosis present

## 2013-09-25 DIAGNOSIS — Z87442 Personal history of urinary calculi: Secondary | ICD-10-CM

## 2013-09-25 DIAGNOSIS — D62 Acute posthemorrhagic anemia: Secondary | ICD-10-CM | POA: Diagnosis not present

## 2013-09-25 DIAGNOSIS — M1710 Unilateral primary osteoarthritis, unspecified knee: Principal | ICD-10-CM | POA: Diagnosis present

## 2013-09-25 DIAGNOSIS — Z833 Family history of diabetes mellitus: Secondary | ICD-10-CM

## 2013-09-25 DIAGNOSIS — K449 Diaphragmatic hernia without obstruction or gangrene: Secondary | ICD-10-CM | POA: Diagnosis present

## 2013-09-25 DIAGNOSIS — M179 Osteoarthritis of knee, unspecified: Secondary | ICD-10-CM | POA: Diagnosis present

## 2013-09-25 DIAGNOSIS — I509 Heart failure, unspecified: Secondary | ICD-10-CM | POA: Diagnosis present

## 2013-09-25 HISTORY — PX: TOTAL KNEE ARTHROPLASTY: SHX125

## 2013-09-25 LAB — TYPE AND SCREEN
ABO/RH(D): A POS
Antibody Screen: NEGATIVE

## 2013-09-25 SURGERY — ARTHROPLASTY, KNEE, TOTAL
Anesthesia: Spinal | Site: Knee | Laterality: Left

## 2013-09-25 MED ORDER — PANTOPRAZOLE SODIUM 40 MG PO TBEC: 40.0000 mg | DELAYED_RELEASE_TABLET | Freq: Every day | ORAL | Status: DC

## 2013-09-25 MED ORDER — PROMETHAZINE HCL 25 MG/ML IJ SOLN: 6.2500 mg | INTRAMUSCULAR | Status: DC | PRN

## 2013-09-25 MED ORDER — PROPOFOL 10 MG/ML IV BOLUS
INTRAVENOUS | Status: DC | PRN
  Administered 2013-09-25: 20 mg via INTRAVENOUS

## 2013-09-25 MED ORDER — PHENYLEPHRINE HCL 10 MG/ML IJ SOLN
INTRAMUSCULAR | Status: AC
  Filled 2013-09-25: qty 1

## 2013-09-25 MED ORDER — DEXAMETHASONE SODIUM PHOSPHATE 10 MG/ML IJ SOLN
INTRAMUSCULAR | Status: DC | PRN
  Administered 2013-09-25: 10 mg via INTRAVENOUS

## 2013-09-25 MED ORDER — FUROSEMIDE 80 MG PO TABS
80.0000 mg | ORAL_TABLET | ORAL | Status: DC | PRN
  Filled 2013-09-25 (×2): qty 1

## 2013-09-25 MED ORDER — ACETAMINOPHEN 650 MG RE SUPP: 650.0000 mg | Freq: Four times a day (QID) | RECTAL | Status: DC | PRN

## 2013-09-25 MED ORDER — METHOCARBAMOL 500 MG PO TABS
500.0000 mg | ORAL_TABLET | Freq: Four times a day (QID) | ORAL | Status: DC | PRN
  Administered 2013-09-26 – 2013-09-28 (×8): 500 mg via ORAL
  Filled 2013-09-25 (×9): qty 1

## 2013-09-25 MED ORDER — PANTOPRAZOLE SODIUM 40 MG PO TBEC
40.0000 mg | DELAYED_RELEASE_TABLET | Freq: Every day | ORAL | Status: DC
  Filled 2013-09-25: qty 1

## 2013-09-25 MED ORDER — PROPOFOL 10 MG/ML IV BOLUS
INTRAVENOUS | Status: AC
  Filled 2013-09-25: qty 20

## 2013-09-25 MED ORDER — PHENOL 1.4 % MT LIQD: 1.0000 | OROMUCOSAL | Status: DC | PRN

## 2013-09-25 MED ORDER — SODIUM CHLORIDE 0.9 % IJ SOLN
INTRAMUSCULAR | Status: AC
  Filled 2013-09-25: qty 50

## 2013-09-25 MED ORDER — DEXAMETHASONE SODIUM PHOSPHATE 10 MG/ML IJ SOLN
10.0000 mg | Freq: Every day | INTRAMUSCULAR | Status: AC
  Filled 2013-09-25: qty 1

## 2013-09-25 MED ORDER — DIPHENHYDRAMINE HCL 12.5 MG/5ML PO ELIX: 12.5000 mg | ORAL_SOLUTION | ORAL | Status: DC | PRN

## 2013-09-25 MED ORDER — SODIUM CHLORIDE 0.9 % IV SOLN: INTRAVENOUS | Status: DC

## 2013-09-25 MED ORDER — BUPIVACAINE HCL 0.25 % IJ SOLN
INTRAMUSCULAR | Status: DC | PRN
  Administered 2013-09-25: 20 mL

## 2013-09-25 MED ORDER — MENTHOL 3 MG MT LOZG: 1.0000 | LOZENGE | OROMUCOSAL | Status: DC | PRN

## 2013-09-25 MED ORDER — TRAMADOL HCL 50 MG PO TABS
50.0000 mg | ORAL_TABLET | Freq: Four times a day (QID) | ORAL | Status: DC | PRN
  Administered 2013-09-26: 50 mg via ORAL
  Administered 2013-09-26 – 2013-09-28 (×7): 100 mg via ORAL
  Filled 2013-09-25 (×4): qty 2
  Filled 2013-09-25: qty 1
  Filled 2013-09-25 (×3): qty 2

## 2013-09-25 MED ORDER — DOCUSATE SODIUM 100 MG PO CAPS
100.0000 mg | ORAL_CAPSULE | Freq: Two times a day (BID) | ORAL | Status: DC
  Administered 2013-09-26 – 2013-09-28 (×6): 100 mg via ORAL

## 2013-09-25 MED ORDER — DEXTROSE-NACL 5-0.9 % IV SOLN
INTRAVENOUS | Status: DC
  Administered 2013-09-25: 19:00:00 via INTRAVENOUS

## 2013-09-25 MED ORDER — ACETAMINOPHEN 10 MG/ML IV SOLN
INTRAVENOUS | Status: DC | PRN
  Administered 2013-09-25: 1000 mg via INTRAVENOUS

## 2013-09-25 MED ORDER — ACETAMINOPHEN 500 MG PO TABS
1000.0000 mg | ORAL_TABLET | Freq: Four times a day (QID) | ORAL | Status: AC
Start: 2013-09-25 — End: 2013-09-26
  Administered 2013-09-26 (×3): 1000 mg via ORAL
  Filled 2013-09-25 (×3): qty 2

## 2013-09-25 MED ORDER — MIDAZOLAM HCL 2 MG/2ML IJ SOLN
INTRAMUSCULAR | Status: AC
  Filled 2013-09-25: qty 2

## 2013-09-25 MED ORDER — BUPIVACAINE LIPOSOME 1.3 % IJ SUSP
20.0000 mL | Freq: Once | INTRAMUSCULAR | Status: DC
  Filled 2013-09-25: qty 20

## 2013-09-25 MED ORDER — TRANEXAMIC ACID 100 MG/ML IV SOLN
1000.0000 mg | INTRAVENOUS | Status: AC
  Administered 2013-09-25: 1000 mg via INTRAVENOUS
  Filled 2013-09-25: qty 10

## 2013-09-25 MED ORDER — FENTANYL CITRATE 0.05 MG/ML IJ SOLN
INTRAMUSCULAR | Status: AC
  Filled 2013-09-25: qty 2

## 2013-09-25 MED ORDER — DEXAMETHASONE SODIUM PHOSPHATE 10 MG/ML IJ SOLN: 10.0000 mg | Freq: Once | INTRAMUSCULAR | Status: DC

## 2013-09-25 MED ORDER — ONDANSETRON HCL 4 MG/2ML IJ SOLN: 4.0000 mg | Freq: Four times a day (QID) | INTRAMUSCULAR | Status: DC | PRN

## 2013-09-25 MED ORDER — METOCLOPRAMIDE HCL 5 MG/ML IJ SOLN
5.0000 mg | Freq: Three times a day (TID) | INTRAMUSCULAR | Status: DC | PRN
  Administered 2013-09-26: 10 mg via INTRAVENOUS
  Filled 2013-09-25: qty 2

## 2013-09-25 MED ORDER — DEXAMETHASONE SODIUM PHOSPHATE 10 MG/ML IJ SOLN
INTRAMUSCULAR | Status: AC
  Filled 2013-09-25: qty 1

## 2013-09-25 MED ORDER — CARVEDILOL 25 MG PO TABS
25.0000 mg | ORAL_TABLET | Freq: Two times a day (BID) | ORAL | Status: DC
  Administered 2013-09-26 – 2013-09-28 (×6): 25 mg via ORAL
  Filled 2013-09-25 (×8): qty 1

## 2013-09-25 MED ORDER — LOSARTAN POTASSIUM 50 MG PO TABS
100.0000 mg | ORAL_TABLET | Freq: Every morning | ORAL | Status: DC
  Administered 2013-09-26 – 2013-09-28 (×3): 100 mg via ORAL
  Filled 2013-09-25 (×3): qty 2

## 2013-09-25 MED ORDER — LACTATED RINGERS IV SOLN
INTRAVENOUS | Status: DC | PRN
  Administered 2013-09-25 (×3): via INTRAVENOUS

## 2013-09-25 MED ORDER — BUPIVACAINE HCL (PF) 0.25 % IJ SOLN
INTRAMUSCULAR | Status: AC
  Filled 2013-09-25: qty 30

## 2013-09-25 MED ORDER — CEFAZOLIN SODIUM-DEXTROSE 2-3 GM-% IV SOLR
2.0000 g | Freq: Four times a day (QID) | INTRAVENOUS | Status: AC
  Administered 2013-09-26 (×2): 2 g via INTRAVENOUS
  Filled 2013-09-25 (×2): qty 50

## 2013-09-25 MED ORDER — PHENYLEPHRINE HCL 10 MG/ML IJ SOLN
10.0000 mg | INTRAVENOUS | Status: DC | PRN
  Administered 2013-09-25: 40 ug/min via INTRAVENOUS

## 2013-09-25 MED ORDER — BISACODYL 10 MG RE SUPP: 10.0000 mg | Freq: Every day | RECTAL | Status: DC | PRN

## 2013-09-25 MED ORDER — ONDANSETRON HCL 4 MG PO TABS: 4.0000 mg | ORAL_TABLET | Freq: Four times a day (QID) | ORAL | Status: DC | PRN

## 2013-09-25 MED ORDER — KETOROLAC TROMETHAMINE 15 MG/ML IJ SOLN: 7.5000 mg | Freq: Four times a day (QID) | INTRAMUSCULAR | Status: AC | PRN

## 2013-09-25 MED ORDER — DEXAMETHASONE 4 MG PO TABS
10.0000 mg | ORAL_TABLET | Freq: Every day | ORAL | Status: AC
  Administered 2013-09-26: 10 mg via ORAL
  Filled 2013-09-25: qty 1

## 2013-09-25 MED ORDER — HYDROMORPHONE HCL PF 1 MG/ML IJ SOLN: 0.2500 mg | INTRAMUSCULAR | Status: DC | PRN

## 2013-09-25 MED ORDER — SODIUM CHLORIDE 0.9 % IJ SOLN
INTRAMUSCULAR | Status: DC | PRN
  Administered 2013-09-25: 30 mL

## 2013-09-25 MED ORDER — MORPHINE SULFATE 2 MG/ML IJ SOLN: 1.0000 mg | INTRAMUSCULAR | Status: DC | PRN

## 2013-09-25 MED ORDER — FLEET ENEMA 7-19 GM/118ML RE ENEM: 1.0000 | ENEMA | Freq: Once | RECTAL | Status: AC | PRN

## 2013-09-25 MED ORDER — ACETAMINOPHEN 10 MG/ML IV SOLN
1000.0000 mg | Freq: Once | INTRAVENOUS | Status: DC
  Filled 2013-09-25: qty 100

## 2013-09-25 MED ORDER — METHOCARBAMOL 100 MG/ML IJ SOLN
500.0000 mg | Freq: Four times a day (QID) | INTRAVENOUS | Status: DC | PRN
  Administered 2013-09-25: 500 mg via INTRAVENOUS
  Filled 2013-09-25: qty 5

## 2013-09-25 MED ORDER — MEPERIDINE HCL 50 MG/ML IJ SOLN: 6.2500 mg | INTRAMUSCULAR | Status: DC | PRN

## 2013-09-25 MED ORDER — RIVAROXABAN 10 MG PO TABS
10.0000 mg | ORAL_TABLET | Freq: Every day | ORAL | Status: DC
  Administered 2013-09-26 – 2013-09-28 (×3): 10 mg via ORAL
  Filled 2013-09-25 (×4): qty 1

## 2013-09-25 MED ORDER — PROPOFOL INFUSION 10 MG/ML OPTIME
INTRAVENOUS | Status: DC | PRN
  Administered 2013-09-25: 75 ug/kg/min via INTRAVENOUS

## 2013-09-25 MED ORDER — POLYETHYLENE GLYCOL 3350 17 G PO PACK: 17.0000 g | PACK | Freq: Every day | ORAL | Status: DC | PRN

## 2013-09-25 MED ORDER — METOCLOPRAMIDE HCL 10 MG PO TABS: 5.0000 mg | ORAL_TABLET | Freq: Three times a day (TID) | ORAL | Status: DC | PRN

## 2013-09-25 MED ORDER — 0.9 % SODIUM CHLORIDE (POUR BTL) OPTIME
TOPICAL | Status: DC | PRN
  Administered 2013-09-25: 1000 mL

## 2013-09-25 MED ORDER — BUPIVACAINE LIPOSOME 1.3 % IJ SUSP
INTRAMUSCULAR | Status: DC | PRN
  Administered 2013-09-25: 20 mL

## 2013-09-25 MED ORDER — CEFAZOLIN SODIUM-DEXTROSE 2-3 GM-% IV SOLR
INTRAVENOUS | Status: AC
  Filled 2013-09-25: qty 50

## 2013-09-25 MED ORDER — OXYCODONE HCL 5 MG PO TABS
5.0000 mg | ORAL_TABLET | ORAL | Status: DC | PRN
  Administered 2013-09-25 – 2013-09-26 (×3): 10 mg via ORAL
  Filled 2013-09-25 (×3): qty 2

## 2013-09-25 MED ORDER — CEFAZOLIN SODIUM-DEXTROSE 2-3 GM-% IV SOLR
2.0000 g | INTRAVENOUS | Status: AC
  Administered 2013-09-25: 2 g via INTRAVENOUS

## 2013-09-25 MED ORDER — BUPIVACAINE IN DEXTROSE 0.75-8.25 % IT SOLN
INTRATHECAL | Status: DC | PRN
  Administered 2013-09-25: 1.8 mL via INTRATHECAL

## 2013-09-25 MED ORDER — ACETAMINOPHEN 325 MG PO TABS: 650.0000 mg | ORAL_TABLET | Freq: Four times a day (QID) | ORAL | Status: DC | PRN

## 2013-09-25 SURGICAL SUPPLY — 58 items
BAG SPEC THK2 15X12 ZIP CLS (MISCELLANEOUS) ×1
BAG ZIPLOCK 12X15 (MISCELLANEOUS) ×2 IMPLANT
BANDAGE ELASTIC 6 VELCRO ST LF (GAUZE/BANDAGES/DRESSINGS) ×2 IMPLANT
BANDAGE ESMARK 6X9 LF (GAUZE/BANDAGES/DRESSINGS) ×1 IMPLANT
BLADE SAG 18X100X1.27 (BLADE) ×2 IMPLANT
BLADE SAW SGTL 11.0X1.19X90.0M (BLADE) ×2 IMPLANT
BNDG CMPR 9X6 STRL LF SNTH (GAUZE/BANDAGES/DRESSINGS) ×1
BNDG ESMARK 6X9 LF (GAUZE/BANDAGES/DRESSINGS) ×2
BOWL SMART MIX CTS (DISPOSABLE) ×2 IMPLANT
CAPT RP KNEE ×1 IMPLANT
CEMENT HV SMART SET (Cement) ×4 IMPLANT
CUFF TOURN SGL QUICK 34 (TOURNIQUET CUFF) ×2
CUFF TRNQT CYL 34X4X40X1 (TOURNIQUET CUFF) ×1 IMPLANT
DECANTER SPIKE VIAL GLASS SM (MISCELLANEOUS) ×2 IMPLANT
DRAPE EXTREMITY T 121X128X90 (DRAPE) ×2 IMPLANT
DRAPE POUCH INSTRU U-SHP 10X18 (DRAPES) ×2 IMPLANT
DRAPE U-SHAPE 47X51 STRL (DRAPES) ×2 IMPLANT
DRSG ADAPTIC 3X8 NADH LF (GAUZE/BANDAGES/DRESSINGS) ×2 IMPLANT
DURAPREP 26ML APPLICATOR (WOUND CARE) ×2 IMPLANT
ELECT REM PT RETURN 9FT ADLT (ELECTROSURGICAL) ×2
ELECTRODE REM PT RTRN 9FT ADLT (ELECTROSURGICAL) ×1 IMPLANT
EVACUATOR 1/8 PVC DRAIN (DRAIN) ×2 IMPLANT
FACESHIELD LNG OPTICON STERILE (SAFETY) ×10 IMPLANT
GLOVE BIO SURGEON STRL SZ7.5 (GLOVE) ×1 IMPLANT
GLOVE BIO SURGEON STRL SZ8 (GLOVE) ×2 IMPLANT
GLOVE BIOGEL PI IND STRL 7.5 (GLOVE) IMPLANT
GLOVE BIOGEL PI IND STRL 8 (GLOVE) ×2 IMPLANT
GLOVE BIOGEL PI INDICATOR 7.5 (GLOVE) ×1
GLOVE BIOGEL PI INDICATOR 8 (GLOVE) ×3
GLOVE SURG SS PI 6.5 STRL IVOR (GLOVE) IMPLANT
GLOVE SURG SS PI 7.0 STRL IVOR (GLOVE) ×1 IMPLANT
GOWN STRL REUS W/TWL LRG LVL3 (GOWN DISPOSABLE) ×3 IMPLANT
GOWN STRL REUS W/TWL XL LVL3 (GOWN DISPOSABLE) ×2 IMPLANT
HANDPIECE INTERPULSE COAX TIP (DISPOSABLE) ×2
IMMOBILIZER KNEE 20 (SOFTGOODS) ×2 IMPLANT
KIT BASIN OR (CUSTOM PROCEDURE TRAY) ×2 IMPLANT
MANIFOLD NEPTUNE II (INSTRUMENTS) ×2 IMPLANT
NDL SAFETY ECLIPSE 18X1.5 (NEEDLE) ×2 IMPLANT
NEEDLE HYPO 18GX1.5 SHARP (NEEDLE) ×4
NS IRRIG 1000ML POUR BTL (IV SOLUTION) ×2 IMPLANT
PACK TOTAL JOINT (CUSTOM PROCEDURE TRAY) ×2 IMPLANT
PAD ABD 8X10 STRL (GAUZE/BANDAGES/DRESSINGS) ×2 IMPLANT
PADDING CAST COTTON 6X4 STRL (CAST SUPPLIES) ×4 IMPLANT
POSITIONER SURGICAL ARM (MISCELLANEOUS) ×2 IMPLANT
SET HNDPC FAN SPRY TIP SCT (DISPOSABLE) ×1 IMPLANT
SPONGE GAUZE 4X4 12PLY (GAUZE/BANDAGES/DRESSINGS) ×2 IMPLANT
STRIP CLOSURE SKIN 1/2X4 (GAUZE/BANDAGES/DRESSINGS) ×3 IMPLANT
SUCTION FRAZIER 12FR DISP (SUCTIONS) ×2 IMPLANT
SUT MNCRL AB 4-0 PS2 18 (SUTURE) ×2 IMPLANT
SUT VIC AB 2-0 CT1 27 (SUTURE) ×6
SUT VIC AB 2-0 CT1 TAPERPNT 27 (SUTURE) ×3 IMPLANT
SUT VLOC 180 0 24IN GS25 (SUTURE) ×2 IMPLANT
SYR 20CC LL (SYRINGE) ×2 IMPLANT
SYR 50ML LL SCALE MARK (SYRINGE) ×2 IMPLANT
TOWEL OR 17X26 10 PK STRL BLUE (TOWEL DISPOSABLE) ×4 IMPLANT
TRAY FOLEY CATH 14FRSI W/METER (CATHETERS) ×2 IMPLANT
WATER STERILE IRR 1500ML POUR (IV SOLUTION) ×2 IMPLANT
WRAP KNEE MAXI GEL POST OP (GAUZE/BANDAGES/DRESSINGS) ×2 IMPLANT

## 2013-09-25 NOTE — Anesthesia Procedure Notes (Signed)
Spinal  Patient location during procedure: OR Start time: 09/25/2013 1:27 PM End time: 09/25/2013 1:31 PM Staffing CRNA/Resident: Anne Fu Performed by: resident/CRNA  Preanesthetic Checklist Completed: patient identified, site marked, surgical consent, pre-op evaluation, timeout performed, IV checked, risks and benefits discussed and monitors and equipment checked Spinal Block Patient position: sitting Prep: Betadine Patient monitoring: heart rate, continuous pulse ox and blood pressure Approach: right paramedian Location: L2-3 Injection technique: single-shot Needle Needle type: Sprotte  Needle gauge: 24 G Needle length: 9 cm Assessment Sensory level: T4 Additional Notes Expiration date of kit checked and confirmed. Patient tolerated procedure well, without complications. X1  attempt with noted CSF return easy aspiration and administration of medication.  Noted T-4 level effect on exam.

## 2013-09-25 NOTE — Interval H&P Note (Signed)
History and Physical Interval Note:  09/25/2013 11:31 AM  Kelly Castaneda  has presented today for surgery, with the diagnosis of osteoarthritis of the left knee  The various methods of treatment have been discussed with the patient and family. After consideration of risks, benefits and other options for treatment, the patient has consented to  Procedure(s): LEFT TOTAL KNEE ARTHROPLASTY (Left) as a surgical intervention .  The patient's history has been reviewed, patient examined, no change in status, stable for surgery.  I have reviewed the patient's chart and labs.  Questions were answered to the patient's satisfaction.     Loanne DrillingALUISIO,Rosangela Fehrenbach V

## 2013-09-25 NOTE — Anesthesia Preprocedure Evaluation (Signed)
Anesthesia Evaluation  Patient identified by MRN, date of birth, ID band Patient awake    Reviewed: Allergy & Precautions, H&P , NPO status , Patient's Chart, lab work & pertinent test results, reviewed documented beta blocker date and time   Airway Mallampati: II TM Distance: >3 FB Neck ROM: Full    Dental  (+) Dental Advisory Given   Pulmonary neg pulmonary ROS,  breath sounds clear to auscultation        Cardiovascular hypertension, Pt. on medications and Pt. on home beta blockers +CHF Rhythm:Regular Rate:Normal     Neuro/Psych  Headaches, negative psych ROS   GI/Hepatic Neg liver ROS, hiatal hernia, GERD-  Medicated,  Endo/Other  Morbid obesity  Renal/GU negative Renal ROS     Musculoskeletal negative musculoskeletal ROS (+)   Abdominal   Peds  Hematology negative hematology ROS (+)   Anesthesia Other Findings   Reproductive/Obstetrics negative OB ROS                           Anesthesia Physical Anesthesia Plan  ASA: III  Anesthesia Plan:    Post-op Pain Management:    Induction:   Airway Management Planned:   Additional Equipment:   Intra-op Plan:   Post-operative Plan:   Informed Consent: I have reviewed the patients History and Physical, chart, labs and discussed the procedure including the risks, benefits and alternatives for the proposed anesthesia with the patient or authorized representative who has indicated his/her understanding and acceptance.   Dental advisory given  Plan Discussed with: CRNA  Anesthesia Plan Comments:         Anesthesia Quick Evaluation

## 2013-09-25 NOTE — Transfer of Care (Signed)
Immediate Anesthesia Transfer of Care Note  Patient: Kelly SkeensLoretta H Castaneda  Procedure(s) Performed: Procedure(s): LEFT TOTAL KNEE ARTHROPLASTY (Left)  Patient Location: PACU  Anesthesia Type:Spinal  Level of Consciousness: awake, alert , oriented and patient cooperative  Airway & Oxygen Therapy: Patient Spontanous Breathing and Patient connected to face mask oxygen  Post-op Assessment: Report given to PACU RN and Post -op Vital signs reviewed and stable  Post vital signs: stable  Complications: No apparent anesthesia complications  T11 spinal level

## 2013-09-25 NOTE — Anesthesia Postprocedure Evaluation (Signed)
Anesthesia Post Note  Patient: Kelly Castaneda  Procedure(s) Performed: Procedure(s) (LRB): LEFT TOTAL KNEE ARTHROPLASTY (Left)  Anesthesia type: Spinal  Patient location: PACU  Post pain: Pain level controlled  Post assessment: Post-op Vital signs reviewed  Last Vitals: BP 126/66  Pulse 90  Temp(Src) 36.7 C (Oral)  Resp 16  SpO2 98%  Post vital signs: Reviewed  Level of consciousness: sedated  Complications: No apparent anesthesia complications

## 2013-09-25 NOTE — H&P (View-Only) (Signed)
Kelly Castaneda  DOB: 09/07/1950 Single / Language: English / Race: White Female  Date of Admission: 09-25-2013  Chief Complaint:  Left Knee Pain  History of Present Illness The patient is a 62 year old female who comes in for a preoperative History and Physical. The patient is scheduled for a left total knee arthroplasty to be performed by Dr. Frank V. Aluisio, MD on 09-25-2013. The patient is a 62 year old female who presents with knee complaints. The patient is seen for a second opinion (for left greater than right knee pain). The patient reports left knee and right knee symptoms including: pain, instability and difficulty with stairs which began year(s) ago without any known injury. Prior to being seen, the patient was previously evaluated by a colleague (Dr. Kramer) year(s) ago. Previous work-up for this problem has included knee x-rays. Past treatment for this problem has included intra-articular injection of corticosteroids (the last injections were done on 01-25-13). The patient was last seen in clinic 5 month(s) ago. Her knees have continued to worsen over time. She states her knee has gotten progressively worse over time. The cortisone injections did help for a while but now they do not work any longer. The knee is hurting her at all times. The knee is limiting what she can and can not do. She is at a stage here where she would like a more permanent solution to her knee. She is ready to proceed with knee replacement and will get the left side done at this time. They have been treated conservatively in the past for the above stated problem and despite conservative measures, they continue to have progressive pain and severe functional limitations and dysfunction. They have failed non-operative management including home exercise, medications, and injections. It is felt that they would benefit from undergoing total joint replacement. Risks and benefits of the procedure have been discussed  with the patient and they elect to proceed with surgery. There are no active contraindications to surgery such as ongoing infection or rapidly progressive neurological disease.  Allergies Codeine Sulfate *ANALGESICS - OPIOID*. Nausea.  Problem List/Past Medical History Primary osteoarthritis of both knees (715.16) High blood pressure Gastroesophageal Reflux Disease Migraine Headache Kidney Stone Congestive Heart Failure Anxiety Disorder Measles Mumps Scarlet Fever   Family History Depression. Sister. Congestive Heart Failure. Maternal Grandmother. Hypertension. Father, Mother. Heart disease in female family member before age 55 First Degree Relatives. reported Diabetes Mellitus. Maternal Grandfather, Mother. Heart disease in female family member before age 65 Heart Disease. Father, Maternal Grandfather, Maternal Grandmother, Paternal Grandmother.    Social History Current work status. retired Children. 0 Living situation. live alone Exercise. Exercises rarely; does running / walking Tobacco use. Never smoker. 06/22/2013 Number of flights of stairs before winded. 2-3 Never consumed alcohol. 06/22/2013: Never consumed alcohol Marital status. single Not under pain contract No history of drug/alcohol rehab    Medication History Losartan Potassium ( Oral) Specific dose unknown - Active. Carvedilol ( Oral) Specific dose unknown - Active. Vitamin D ( Oral) Specific dose unknown - Active. Omeprazole ( Oral) Specific dose unknown - Active. Aleve (220MG Tablet, Oral) Active. (prn) Furosemide ( Oral) Specific dose unknown - Active. (2-3 X per week) Aspirin Low Strength (81MG Tablet Chewable, Oral) Active.    Past Surgical History Tubal Ligation. Date: 1985. Spinal Surgery. Date: 2009. Dilation and Curettage of Uterus - Multiple Breast Biopsy. right Hysterectomy. Date: 1996. partial (cancerous) Gallbladder Surgery. Date: 1993.  laporoscopic Parathyroidectomy. Date: 1998.   Review of Systems General:Not   Present- Chills, Fever, Night Sweats, Fatigue, Weight Gain, Weight Loss and Memory Loss. Skin:Not Present- Hives, Itching, Rash, Eczema and Lesions. HEENT:Not Present- Tinnitus, Headache, Double Vision, Visual Loss, Hearing Loss and Dentures. Respiratory:Not Present- Shortness of breath with exertion, Shortness of breath at rest, Allergies, Coughing up blood and Chronic Cough. Cardiovascular:Not Present- Chest Pain, Racing/skipping heartbeats, Difficulty Breathing Lying Down, Murmur, Swelling and Palpitations. Gastrointestinal:Not Present- Bloody Stool, Heartburn, Abdominal Pain, Vomiting, Nausea, Constipation, Diarrhea, Difficulty Swallowing, Jaundice and Loss of appetitie. Female Genitourinary:Not Present- Blood in Urine, Urinary frequency, Weak urinary stream, Discharge, Flank Pain, Incontinence, Painful Urination, Urgency, Urinary Retention and Urinating at Night. Musculoskeletal:Present- Joint Pain. Not Present- Muscle Weakness, Muscle Pain, Joint Swelling, Back Pain, Morning Stiffness and Spasms. Neurological:Not Present- Tremor, Dizziness, Blackout spells, Paralysis, Difficulty with balance and Weakness. Psychiatric:Not Present- Insomnia.    Vitals Weight: 233 lb Height: 64 in Weight was reported by patient. Height was reported by patient. Body Surface Area: 2.18 m Body Mass Index: 39.99 kg/m Pulse: 64 (Regular) Resp.: 16 (Unlabored) BP: 118/60 (Sitting, Left Arm, Standard)     Physical Exam The physical exam findings are as follows:   General Mental Status - Alert, cooperative and good historian. General Appearance- pleasant. Not in acute distress. Orientation- Oriented X3. Build & Nutrition- Well nourished and Well developed.   Head and Neck Head- normocephalic, atraumatic . Neck Global Assessment- supple. no bruit auscultated on the right and no bruit  auscultated on the left.   Eye Pupil- Bilateral- Regular and Round. Motion- Bilateral- EOMI.   ENMT partial lower denture plate  Chest and Lung Exam Auscultation: Breath sounds:- clear at anterior chest wall and - clear at posterior chest wall. Adventitious sounds:- No Adventitious sounds.   Cardiovascular Auscultation:Rhythm- Regular rate and rhythm. Heart Sounds- S1 WNL and S2 WNL. Murmurs & Other Heart Sounds:Auscultation of the heart reveals - No Murmurs.   Abdomen Inspection:Contour- Generalized moderate distention. Palpation/Percussion:Tenderness- Abdomen is non-tender to palpation. Rigidity (guarding)- Abdomen is soft. Auscultation:Auscultation of the abdomen reveals - Bowel sounds normal.   Female Genitourinary Not done, not pertinent to present illness  Musculoskeletal She is alert and oriented. No apparent distress. Evaluation of her hips show a normal range of motion. No discomfort. The right knee shows no effusion. Range is 0-130. No tenderness or instability. The left knee shows no effusion. Range is about 5-120. Varus deformity. There is tenderness medial greater than lateral with no instability noted. Pulse, sensation and motor are intact in both lower extremities.  RADIOGRAPHS: AP of both knees and lateral of the left show bone on bone arthritis in the medial and patellofemoral compartments of the left knee. The radiographs are from Dr. Kramer's office from June.   Assessment & Plan Primary osteoarthritis of both knees (715.16) Impression: Left Knee  Note: Plan is for a Left Total Knee Replacement by Dr. Aluisio.  Plan is to go to [facility]  PCP - Dr. Robert Gates - Patient has been seen preoperatively and felt to be stable for surgery.  The patient does not have any contraindications and will receive TXA (tranexamic acid) prior to surgery.  Signed electronically by Aidian Salomon L Patt Steinhardt, III PA-C 

## 2013-09-25 NOTE — Op Note (Signed)
Pre-operative diagnosis- Osteoarthritis  Left knee(s)  Post-operative diagnosis- Osteoarthritis Left knee(s)  Procedure-  Left  Total Knee Arthroplasty  Surgeon- Gus RankinFrank V. Mardelle Pandolfi, MD  Assistant- Avel Peacerew Perkins, PA-C   Anesthesia-  Spinal EBL-* No blood loss amount entered *  Drains hemovac  Tourniquet time-  Total Tourniquet Time Documented: Thigh (Left) - 36 minutes Total: Thigh (Left) - 36 minutes    Complications- None  Condition-PACU - hemodynamically stable.   Brief Clinical Note  Kelly Castaneda is a 63 y.o. year old female with end stage OA of her left knee with progressively worsening pain and dysfunction. She has constant pain, with activity and at rest and significant functional deficits with difficulties even with ADLs. She has had extensive non-op management including analgesics, injections of cortisone and viscosupplements, and home exercise program, but remains in significant pain with significant dysfunction. Radiographs show bone on bone arthritis medial and patellofemoral. She presents now for left Total Knee Arthroplasty.    Procedure in detail---   The patient is brought into the operating room and positioned supine on the operating table. After successful administration of  Spinal,   a tourniquet is placed high on the  Left thigh(s) and the lower extremity is prepped and draped in the usual sterile fashion. Time out is performed by the operating team and then the  Left lower extremity is wrapped in Esmarch, knee flexed and the tourniquet inflated to 300 mmHg.       A midline incision is made with a ten blade through the subcutaneous tissue to the level of the extensor mechanism. A fresh blade is used to make a medial parapatellar arthrotomy. Soft tissue over the proximal medial tibia is subperiosteally elevated to the joint line with a knife and into the semimembranosus bursa with a Cobb elevator. Soft tissue over the proximal lateral tibia is elevated with attention  being paid to avoiding the patellar tendon on the tibial tubercle. The patella is everted, knee flexed 90 degrees and the ACL and PCL are removed. Findings are bone on bone medial and patellofemoral with large global osteophytes.        The drill is used to create a starting hole in the distal femur and the canal is thoroughly irrigated with sterile saline to remove the fatty contents. The 5 degree Left  valgus alignment guide is placed into the femoral canal and the distal femoral cutting block is pinned to remove 10 mm off the distal femur. Resection is made with an oscillating saw.      The tibia is subluxed forward and the menisci are removed. The extramedullary alignment guide is placed referencing proximally at the medial aspect of the tibial tubercle and distally along the second metatarsal axis and tibial crest. The block is pinned to remove 2mm off the more deficient medial  side. Resection is made with an oscillating saw. Size 3is the most appropriate size for the tibia and the proximal tibia is prepared with the modular drill and keel punch for that size.      The femoral sizing guide is placed and size 3 is most appropriate. Rotation is marked off the epicondylar axis and confirmed by creating a rectangular flexion gap at 90 degrees. The size 3 cutting block is pinned in this rotation and the anterior, posterior and chamfer cuts are made with the oscillating saw. The intercondylar block is then placed and that cut is made.      Trial size 3 tibial component, trial size 3  posterior stabilized femur and a 10  mm posterior stabilized rotating platform insert trial is placed. Full extension is achieved with excellent varus/valgus and anterior/posterior balance throughout full range of motion. The patella is everted and thickness measured to be 22  mm. Free hand resection is taken to 12 mm, a 35 template is placed, lug holes are drilled, trial patella is placed, and it tracks normally. Osteophytes are  removed off the posterior femur with the trial in place. All trials are removed and the cut bone surfaces prepared with pulsatile lavage. Cement is mixed and once ready for implantation, the size 3 tibial implant, size  3 posterior stabilized femoral component, and the size 35 patella are cemented in place and the patella is held with the clamp. The trial insert is placed and the knee held in full extension. The Exparel (20 ml mixed with 30 ml saline) and .25% Bupivicaine, are injected into the extensor mechanism, posterior capsule, medial and lateral gutters and subcutaneous tissues.  All extruded cement is removed and once the cement is hard the permanent 10 mm posterior stabilized rotating platform insert is placed into the tibial tray.      The wound is copiously irrigated with saline solution and the extensor mechanism closed over a hemovac drain with #1 PDS suture. The tourniquet is released for a total tourniquet time of 34  minutes. Flexion against gravity is 140 degrees and the patella tracks normally. Subcutaneous tissue is closed with 2.0 vicryl and subcuticular with running 4.0 Monocryl. The incision is cleaned and dried and steri-strips and a bulky sterile dressing are applied. The limb is placed into a knee immobilizer and the patient is awakened and transported to recovery in stable condition.      Please note that a surgical assistant was a medical necessity for this procedure in order to perform it in a safe and expeditious manner. Surgical assistant was necessary to retract the ligaments and vital neurovascular structures to prevent injury to them and also necessary for proper positioning of the limb to allow for anatomic placement of the prosthesis.   Gus Rankin [name], MD    09/25/2013, 2:36 PM

## 2013-09-26 DIAGNOSIS — D62 Acute posthemorrhagic anemia: Secondary | ICD-10-CM | POA: Diagnosis not present

## 2013-09-26 LAB — CBC
HCT: 34.4 % — ABNORMAL LOW (ref 36.0–46.0)
HEMOGLOBIN: 11.7 g/dL — AB (ref 12.0–15.0)
MCH: 30.9 pg (ref 26.0–34.0)
MCHC: 34 g/dL (ref 30.0–36.0)
MCV: 90.8 fL (ref 78.0–100.0)
Platelets: 173 10*3/uL (ref 150–400)
RBC: 3.79 MIL/uL — ABNORMAL LOW (ref 3.87–5.11)
RDW: 12.4 % (ref 11.5–15.5)
WBC: 8.1 10*3/uL (ref 4.0–10.5)

## 2013-09-26 LAB — BASIC METABOLIC PANEL
BUN: 13 mg/dL (ref 6–23)
CALCIUM: 9.3 mg/dL (ref 8.4–10.5)
CO2: 24 mEq/L (ref 19–32)
CREATININE: 0.57 mg/dL (ref 0.50–1.10)
Chloride: 102 mEq/L (ref 96–112)
GFR calc Af Amer: >90 mL/min (ref >90)
Glucose, Bld: 166 mg/dL — ABNORMAL HIGH (ref 70–99)
Potassium: 3.8 mEq/L (ref 3.7–5.3)
Sodium: 138 mEq/L (ref 137–147)

## 2013-09-26 MED ORDER — NON FORMULARY: 20.0000 mg | Freq: Every day | Status: DC

## 2013-09-26 MED ORDER — OMEPRAZOLE 20 MG PO CPDR
20.0000 mg | DELAYED_RELEASE_CAPSULE | Freq: Every day | ORAL | Status: DC
  Administered 2013-09-26 – 2013-09-28 (×3): 20 mg via ORAL
  Filled 2013-09-26 (×3): qty 1

## 2013-09-26 NOTE — Progress Notes (Signed)
Plan for dc is to SNF when medically stable. No NCM needs identified. Isidoro DonningAlesia Jaspreet Bodner RN CCM Case Mgmt phone [phone]

## 2013-09-26 NOTE — Evaluation (Signed)
Physical Therapy Evaluation Patient Details Name: Kelly SkeensLoretta H Varma MRN: [id number] DOB: [birth year] Today's Date: 09/26/2013 Time: [id number] PT Time Calculation (min): 24 min  PT Assessment / Plan / Recommendation History of Present Illness  LTKA  Clinical Impression  Pt tolerated short ambulation today. Pt will benefit from PT to address problems listed. Pt will benefit from SNF for return to functional independence.    PT Assessment  Patient needs continued PT services    Follow Up Recommendations  SNF    Does the patient have the potential to tolerate intense rehabilitation      Barriers to Discharge        Equipment Recommendations  Rolling walker with 5" wheels    Recommendations for Other Services     Frequency 7X/week    Precautions / Restrictions Precautions Precautions: Knee Required Braces or Orthoses: Knee Immobilizer - Left   Pertinent Vitals/Pain L knee 4      Mobility  Bed Mobility Overal bed mobility: Needs Assistance Bed Mobility: Supine to Sit Supine to sit: Mod assist;HOB elevated General bed mobility comments: Pt had difficulty getting to upright, Support of trunk , use of rail and HOB elevated. Transfers Overall transfer level: Needs assistance Equipment used: Rolling walker (2 wheeled) Transfers: Sit to/from Stand Sit to Stand: Mod assist;From elevated surface General transfer comment: bed raised due to DJD of R knee, cues for ghand placement and L leg positiion Ambulation/Gait Ambulation/Gait assistance: +2 safety/equipment;Min assist Ambulation Distance (Feet): 25 Feet Assistive device: Rolling walker (2 wheeled) General Gait Details: cues for safety, position inside of RW, not to step  LLE too far ahead.    Exercises Total Joint Exercises Ankle Circles/Pumps: AROM;Both;10 reps;Supine Quad Sets: AROM;Both;10 reps;Supine Heel Slides: AAROM;Left;10 reps;Supine Hip ABduction/ADduction: AAROM;Left;10 reps;Supine Straight Leg Raises:  AAROM;Left;10 reps;Supine   PT Diagnosis: Difficulty walking;Acute pain  PT Problem List: Decreased strength;Decreased range of motion;Decreased activity tolerance;Decreased mobility;Decreased knowledge of use of DME;Decreased safety awareness;Decreased knowledge of precautions PT Treatment Interventions: DME instruction;Gait training;Functional mobility training;Therapeutic activities;Therapeutic exercise;Patient/family education     PT Goals(Current goals can be found in the care plan section) Acute Rehab PT Goals Patient Stated Goal: I need to go to rehab to be independent PT Goal Formulation: With patient/family Time For Goal Achievement: 10/03/13 Potential to Achieve Goals: Good  Visit Information  Last PT Received On: 09/26/13 Assistance Needed: +1 History of Present Illness: LTKA       Prior Functioning  Home Living Family/patient expects to be discharged to:: Skilled nursing facility Prior Function Level of Independence: Independent Communication Communication: No difficulties    Cognition  Cognition Arousal/Alertness: Awake/alert Behavior During Therapy: WFL for tasks assessed/performed Overall Cognitive Status: Within Functional Limits for tasks assessed    Extremity/Trunk Assessment Upper Extremity Assessment Upper Extremity Assessment: Defer to OT evaluation Lower Extremity Assessment Lower Extremity Assessment: LLE deficits/detail LLE Deficits / Details: , knee flexion  45*   Balance    End of Session PT - End of Session Equipment Utilized During Treatment: Left knee immobilizer Activity Tolerance: Patient tolerated treatment well Patient left: in chair;with call bell/phone within reach;with family/visitor present Nurse Communication: Mobility status  GP     Rada HayHill, Mardene Lessig Elizabeth 09/26/2013, 9:50 AM Blanchard KelchKaren Debarah Mccumbers PT [phone]

## 2013-09-26 NOTE — Progress Notes (Signed)
   Subjective: 1 Day Post-Op Procedure(s) (LRB): LEFT TOTAL KNEE ARTHROPLASTY (Left) Patient reports pain as mild.   Patient seen in rounds with Dr. Lequita HaltAluisio. Family in room. Patient is well, and has had no acute complaints or problems We will start therapy today.  Plan is to go to [facility] after hospital stay.  Objective: Vital signs in last 24 hours: Temp:  [97.3 F (36.3 C)-98.2 F (36.8 C)] 97.3 F (36.3 C) (02/24 0656) Pulse Rate:  [68-92] 91 (02/24 0656) Resp:  [11-18] 16 (02/24 0656) BP: (85-164)/(61-87) 164/87 mmHg (02/24 0656) SpO2:  [95 %-100 %] 100 % (02/24 0656)  Intake/Output from previous day:  Intake/Output Summary (Last 24 hours) at 09/26/13 0811 Last data filed at 09/26/13 0754  Gross per 24 hour  Intake   2880 ml  Output   2760 ml  Net    120 ml    Intake/Output this shift: Total I/O In: 360 [P.O.:360] Out: -   Labs:  Recent Labs  09/26/13 0404  HGB 11.7*    Recent Labs  09/26/13 0404  WBC 8.1  RBC 3.79*  HCT 34.4*  PLT 173    Recent Labs  09/26/13 0404  NA 138  K 3.8  CL 102  CO2 24  BUN 13  CREATININE 0.57  GLUCOSE 166*  CALCIUM 9.3   No results found for this basename: LABPT, INR,  in the last 72 hours  EXAM General - Patient is Alert, Appropriate and Oriented Extremity - Neurovascular intact Sensation intact distally Dressing - dressing C/D/I Motor Function - intact, moving foot and toes well on exam.  Hemovac pulled without difficulty.  Past Medical History  Diagnosis Date  . Hypertension   . CHF (congestive heart failure)   . Thyroid disease   . Bell's palsy   . Shingles   . History of kidney stones   . GERD (gastroesophageal reflux disease)   . H/O hiatal hernia   . Headache(784.0)     migraines  . Arthritis     Assessment/Plan: 1 Day Post-Op Procedure(s) (LRB): LEFT TOTAL KNEE ARTHROPLASTY (Left) Principal Problem:   OA (osteoarthritis) of knee Active Problems:   Postoperative anemia due to  acute blood loss  Estimated body mass index is 40.32 kg/(m^2) as calculated from the following:   Height as of this encounter: 5\' 4"  (1.626 m).   Weight as of 02/01/13: 106.595 kg (235 lb). Advance diet Up with therapy Discharge to SNF  DVT Prophylaxis - Xarelto Weight-Bearing as tolerated to left leg D/C O2 and Pulse OX and try on Room Air  Kelly Castaneda 09/26/2013, 8:11 AM

## 2013-09-26 NOTE — Progress Notes (Signed)
Physical Therapy Treatment Patient Details Name: Kelly Castaneda MRN: [id number] DOB: August 01, 1951 Today's Date: 09/26/2013 Time: [id number] PT Time Calculation (min): 17 min  PT Assessment / Plan / Recommendation  History of Present Illness LTKA   PT Comments   Pt tolerated well. Will benefit from rehab to become modified independent.  Follow Up Recommendations  SNF     Does the patient have the potential to tolerate intense rehabilitation     Barriers to Discharge        Equipment Recommendations  Rolling walker with 5" wheels    Recommendations for Other Services    Frequency 7X/week   Progress towards PT Goals Progress towards PT goals: Progressing toward goals  Plan      Precautions / Restrictions Precautions Precautions: Knee Required Braces or Orthoses: Knee Immobilizer - Left Restrictions Weight Bearing Restrictions: No   Pertinent Vitals/Pain 3 l knee, ice applied.    Mobility  Bed Mobility Overal bed mobility: Needs Assistance Bed Mobility: Sit to Supine Supine to sit: Mod assist General bed mobility comments: suppot LLE onto bed. Transfers Overall transfer level: Needs assistance Equipment used: Rolling walker (2 wheeled) Transfers: Sit to/from Stand Sit to Stand: Min assist;Min guard General transfer comment: cues for hand and RLE placement Ambulation/Gait Ambulation/Gait assistance: Min assist Ambulation Distance (Feet): 60 Feet Assistive device: Rolling walker (2 wheeled) General Gait Details: cues for safety, position inside of RW, not to step  LLE too far ahead.    Exercises Total Joint Exercises Ankle Circles/Pumps: AROM;Both;10 reps;Supine Quad Sets: AROM;Both;10 reps;Supine Heel Slides: AAROM;Left;10 reps;Supine Hip ABduction/ADduction: AAROM;Left;10 reps;Supine Straight Leg Raises: AAROM;Left;10 reps;Supine   PT Diagnosis: Difficulty walking;Acute pain  PT Problem List: Decreased strength;Decreased range of motion;Decreased activity  tolerance;Decreased mobility;Decreased knowledge of use of DME;Decreased safety awareness;Decreased knowledge of precautions PT Treatment Interventions: DME instruction;Gait training;Functional mobility training;Therapeutic activities;Therapeutic exercise;Patient/family education   PT Goals (current goals can now be found in the care plan section) Acute Rehab PT Goals Patient Stated Goal: rehab and get back to being independent PT Goal Formulation: With patient/family Time For Goal Achievement: 10/03/13 Potential to Achieve Goals: Good  Visit Information  Last PT Received On: 09/26/13 Assistance Needed: +1 History of Present Illness: LTKA    Subjective Data  Patient Stated Goal: rehab and get back to being independent   Cognition  Cognition Arousal/Alertness: Awake/alert Behavior During Therapy: WFL for tasks assessed/performed Overall Cognitive Status: Within Functional Limits for tasks assessed    Balance     End of Session PT - End of Session Equipment Utilized During Treatment: Left knee immobilizer Activity Tolerance: Patient tolerated treatment well Patient left: with call bell/phone within reach;with family/visitor present;in bed Nurse Communication: Mobility status   GP     Rada HayHill, Dwan Hemmelgarn Elizabeth 09/26/2013, 1:31 PM

## 2013-09-26 NOTE — Progress Notes (Signed)
Clinical Social Work Department CLINICAL SOCIAL WORK PLACEMENT NOTE 09/26/2013  Patient:  Kelly Castaneda,Kelly Castaneda  Account Number:  [id number] Admit date:  09/25/2013  Clinical Social Worker:  Cori RazorJAMIE Alphonza Tramell, LCSW  Date/time:  09/26/2013 12:41 PM  Clinical Social Work is seeking post-discharge placement for this patient at the following level of care:   SKILLED NURSING   (*CSW will update this form in Epic as items are completed)     Patient/family provided with [facility] Department of Clinical Social Work's list of facilities offering this level of care within the geographic area requested by the patient (or if unable, by the patient's family).  09/26/2013  Patient/family informed of their freedom to choose among providers that offer the needed level of care, that participate in Medicare, Medicaid or managed care program needed by the patient, have an available bed and are willing to accept the patient.    Patient/family informed of [facility]' ownership interest in [facility], as well as of the fact that they are under no obligation to receive care at this facility.  PASARR submitted to EDS on 09/26/2013 PASARR number received from EDS on 09/26/2013  FL2 transmitted to all facilities in geographic area requested by pt/family on  09/26/2013 FL2 transmitted to all facilities within larger geographic area on   Patient informed that his/her managed care company has contracts with or will negotiate with  certain facilities, including the following:     Patient/family informed of bed offers received:  09/26/2013 [facility] Physician recommends and patient chooses bed at    [facility]   Patient to be transferred to facility by   The following physician request were entered in Epic:   Additional Comments:  Cori RazorJamie Alexes Menchaca LCSW [phone]

## 2013-09-26 NOTE — Progress Notes (Signed)
Clinical Social Work Department BRIEF PSYCHOSOCIAL ASSESSMENT 09/26/2013  Patient:  Kelly Castaneda, Kelly Castaneda     Account Number:  [id number]     Admit date:  09/25/2013  Clinical Social Worker:  Lacie Scotts  Date/Time:  09/26/2013 12:25 PM  Referred by:  Physician  Date Referred:  09/26/2013 Referred for  SNF Placement   Other Referral:   Interview type:  Patient Other interview type:    PSYCHOSOCIAL DATA Living Status:  ALONE Admitted from facility:   Level of care:   Primary support name:  Garey Ham Primary support relationship to patient:  PARENT Degree of support available:   unclear    CURRENT CONCERNS Current Concerns  Post-Acute Placement   Other Concerns:    SOCIAL WORK ASSESSMENT / PLAN pt is a 63 yr old female living at home prior to hospitalization. CSW met with pt / family to assist with d/c planning. Pt has made prior arrangements to have ST Rehab at [facility] following hospital d/c. CSW has contacted SNF and d/c plan has been confirmed pending BCBS authorization. Clinicals have been sent to SNF to begin auth process.   Assessment/plan status:  Psychosocial Support/Ongoing Assessment of Needs Other assessment/ plan:   Information/referral to community resources:   Insurance coverage for SNF and Ambulance transport has been reviewed.    PATIENT'S/FAMILY'S RESPONSE TO PLAN OF CARE: Pt's mood is bright . She had a little sleep last night. " I'm happy the surgery is over. " Pt is looking forward to having rehab at [facility].   Werner Lean LCSW [phone]

## 2013-09-26 NOTE — Evaluation (Signed)
Occupational Therapy Evaluation Patient Details Name: Kelly Castaneda MRN: [id number] DOB: [birth year] Today's Date: 09/26/2013 Time: 4098-1191 OT Time Calculation (min): 21 min  OT Assessment / Plan / Recommendation History of present illness LTKA   Clinical Impression   Pt was admitted for the above surgery.  She will benefit from skilled OT to increase safety and independence with adls.  Goals in acute are set for supervision level overall.    OT Assessment  Patient needs continued OT Services    Follow Up Recommendations  SNF    Barriers to Discharge      Equipment Recommendations  3 in 1 bedside comode    Recommendations for Other Services    Frequency  Min 2X/week    Precautions / Restrictions Precautions Precautions: Knee Required Braces or Orthoses: Knee Immobilizer - Left Restrictions Weight Bearing Restrictions: No   Pertinent Vitals/Pain L knee sore with weightbearing.  Repositioned and ice applied    ADL  Grooming: Set up;Wash/dry hands Where Assessed - Grooming: Unsupported sitting Upper Body Bathing: Set up Where Assessed - Upper Body Bathing: Unsupported sitting Lower Body Bathing: Minimal assistance Where Assessed - Lower Body Bathing: Supported sit to stand Upper Body Dressing: Set up Where Assessed - Upper Body Dressing: Unsupported sitting Lower Body Dressing: Moderate assistance Where Assessed - Lower Body Dressing: Supported sit to Pharmacist, hospital: Minimal assistance Statistician Method: Sit to Barista: Raised toilet seat with arms (or 3-in-1 over toilet) Toileting - Clothing Manipulation and Hygiene: Min guard Where Assessed - Toileting Clothing Manipulation and Hygiene: Sit to stand from 3-in-1 or toilet Equipment Used: Rolling walker Transfers/Ambulation Related to ADLs: ambulated to bathroom with min guard ADL Comments: pt has a reacher and long netted sponge on a stick.  Explained reacher uses    OT  Diagnosis: Generalized weakness  OT Problem List: Decreased strength;Decreased activity tolerance;Decreased knowledge of use of DME or AE;Pain OT Treatment Interventions: Self-care/ADL training;DME and/or AE instruction;Patient/family education   OT Goals(Current goals can be found in the care plan section) Acute Rehab OT Goals Patient Stated Goal: rehab and get back to being independent OT Goal Formulation: With patient Time For Goal Achievement: 10/03/13 Potential to Achieve Goals: Good ADL Goals Pt Will Perform Grooming: with supervision;standing Pt Will Perform Lower Body Bathing: with supervision;with adaptive equipment;sit to/from stand Pt Will Perform Lower Body Dressing: with supervision;with adaptive equipment;sit to/from stand (pants only) Pt Will Transfer to Toilet: with supervision;bedside commode;ambulating Pt Will Perform Toileting - Clothing Manipulation and hygiene: with supervision;sit to/from stand  Visit Information  Last OT Received On: 09/26/13 Assistance Needed: +1 History of Present Illness: LTKA       Prior Functioning     Home Living Family/patient expects to be discharged to:: Skilled nursing facility Prior Function Level of Independence: Independent Communication Communication: No difficulties         Vision/Perception     Cognition  Cognition Arousal/Alertness: Awake/alert Behavior During Therapy: WFL for tasks assessed/performed Overall Cognitive Status: Within Functional Limits for tasks assessed    Extremity/Trunk Assessment Upper Extremity Assessment Upper Extremity Assessment: Overall WFL for tasks assessed     Mobility  Transfers Overall transfer level: Needs assistance Equipment used: Rolling walker (2 wheeled) Transfers: Sit to/from Stand Sit to Stand: Min assist;Min guard General transfer comment: cues for hand and RLE placement     Exercise    Balance     End of Session OT - End of Session Activity Tolerance:  Patient tolerated  treatment well Patient left: in chair;with call bell/phone within reach;with family/visitor present  GO     Sebastian Dzik 09/26/2013, 11:31 AM Marica OtterMaryellen Benigno Check, OTR/L [phone] 09/26/2013

## 2013-09-26 NOTE — Progress Notes (Signed)
UR completed. Arel Tippen RN CCM Case Mgmt phone [phone] 

## 2013-09-27 LAB — CBC
HCT: 34.1 % — ABNORMAL LOW (ref 36.0–46.0)
Hemoglobin: 11.3 g/dL — ABNORMAL LOW (ref 12.0–15.0)
MCH: 30.4 pg (ref 26.0–34.0)
MCHC: 33.1 g/dL (ref 30.0–36.0)
MCV: 91.7 fL (ref 78.0–100.0)
PLATELETS: 173 10*3/uL (ref 150–400)
RBC: 3.72 MIL/uL — ABNORMAL LOW (ref 3.87–5.11)
RDW: 12.6 % (ref 11.5–15.5)
WBC: 13.7 10*3/uL — ABNORMAL HIGH (ref 4.0–10.5)

## 2013-09-27 LAB — BASIC METABOLIC PANEL
BUN: 14 mg/dL (ref 6–23)
CALCIUM: 9.8 mg/dL (ref 8.4–10.5)
CO2: 27 mEq/L (ref 19–32)
CREATININE: 0.64 mg/dL (ref 0.50–1.10)
Chloride: 102 mEq/L (ref 96–112)
GFR calc Af Amer: >90 mL/min (ref >90)
GFR calc non Af Amer: >90 mL/min (ref >90)
Glucose, Bld: 159 mg/dL — ABNORMAL HIGH (ref 70–99)
Potassium: 4.7 mEq/L (ref 3.7–5.3)
Sodium: 140 mEq/L (ref 137–147)

## 2013-09-27 NOTE — Progress Notes (Addendum)
Physical Therapy Treatment Patient Details Name: Kelly SkeensLoretta H Trevor MRN: [id number] DOB: 05-26-1951 Today's Date: 09/27/2013 Time: [id number] PT Time Calculation (min): 25 min  PT Assessment / Plan / Recommendation  History of Present Illness LTKA   PT Comments   Pt continues to progress, requires cues for safe use of RW and sequencing. Recommend short term rehab  To return to safe modified independent level.  Follow Up Recommendations  SNF     Does the patient have the potential to tolerate intense rehabilitation     Barriers to Discharge        Equipment Recommendations  Rolling walker with 5" wheels    Recommendations for Other Services    Frequency 7X/week   Progress towards PT Goals Progress towards PT goals: Progressing toward goals  Plan Current plan remains appropriate    Precautions / Restrictions Precautions Precautions: Knee Required Braces or Orthoses: Knee Immobilizer - Left Knee Immobilizer - Left: Discontinue once straight leg raise with < 10 degree lag   Pertinent Vitals/Pain Pt reports pain is 2 at rest and 5 for weight bearing.    Mobility  Bed Mobility Overal bed mobility: Needs Assistance Bed Mobility: Supine to Sit Supine to sit: Mod assist;HOB elevated General bed mobility comments: suppot LLE  to lower to floor, use of rail to get upright, HOB raised to 45 degrees. Transfers Overall transfer level: Needs assistance Equipment used: Rolling walker (2 wheeled) Transfers: Sit to/from Stand Sit to Stand: Min assist;Min guard General transfer comment: from bed and BSC, cues for hand palcement and for  LLE position prior to sitting. Ambulation/Gait Ambulation/Gait assistance: Min assist Ambulation Distance (Feet): 60 Feet Gait Pattern/deviations: Step-to pattern;Step-through pattern;Antalgic General Gait Details: cues to roll vs. pick up RW, cues to shorten L step length and saety inside of RW.    Exercises Total Joint Exercises Ankle Circles/Pumps:  AROM;Both;10 reps;Supine Quad Sets: AROM;Both;10 reps;Supine Heel Slides: AAROM;AROM;Both;10 reps;Supine Hip ABduction/ADduction: AAROM;10 reps;Supine;Both;AROM Straight Leg Raises: AAROM;10 reps;Supine;AROM;Both: Pt is able to perform 1 SLR on L but with much effort and pain. Used bed sheet to self assist for SLR and Heel slides on L  Goniometric ROM: 5-45* L knee   PT Diagnosis:    PT Problem List:   PT Treatment Interventions:     PT Goals (current goals can now be found in the care plan section)    Visit Information  Last PT Received On: 09/27/13 Assistance Needed: +1 History of Present Illness: LTKA    Subjective Data      Cognition  Cognition Arousal/Alertness: Awake/alert    Balance     End of Session PT - End of Session Equipment Utilized During Treatment: Left knee immobilizer Activity Tolerance: Patient tolerated treatment well Patient left: with call bell/phone within reach;in chair Nurse Communication: Mobility status   GP     Rada HayHill, Preeti Winegardner Elizabeth 09/27/2013, 9:01 AM Blanchard KelchKaren Britain Saber PT [phone]

## 2013-09-27 NOTE — Progress Notes (Signed)
[place] [name] [place]. CSW will assist with d/c planning to SNF.  [name] LCSW [phone]

## 2013-09-27 NOTE — Progress Notes (Signed)
   Subjective: 2 Days Post-Op Procedure(s) (LRB): LEFT TOTAL KNEE ARTHROPLASTY (Left) Patient reports pain as mild and moderate.   Patient seen in rounds for Dr. Lequita HaltAluisio. She was feeling better today. Patient is well, but has had some minor complaints of pain in the knee, requiring pain medications Plan is to go Skilled nursing facility after hospital stay.  Objective: Vital signs in last 24 hours: Temp:  [97.8 F (36.6 C)-98.3 F (36.8 C)] 98.3 F (36.8 C) (02/25 0500) Pulse Rate:  [76-79] 76 (02/25 0952) Resp:  [16] 16 (02/25 0500) BP: (150-163)/(72-106) 160/89 mmHg (02/25 0952) SpO2:  [93 %-96 %] 94 % (02/25 0751)  Intake/Output from previous day:  Intake/Output Summary (Last 24 hours) at 09/27/13 1322 Last data filed at 09/27/13 0700  Gross per 24 hour  Intake    720 ml  Output   1250 ml  Net   -530 ml    Intake/Output this shift:    Labs:  Recent Labs  09/26/13 0404 09/27/13 0418  HGB 11.7* 11.3*    Recent Labs  09/26/13 0404 09/27/13 0418  WBC 8.1 13.7*  RBC 3.79* 3.72*  HCT 34.4* 34.1*  PLT 173 173    Recent Labs  09/26/13 0404 09/27/13 0418  NA 138 140  K 3.8 4.7  CL 102 102  CO2 24 27  BUN 13 14  CREATININE 0.57 0.64  GLUCOSE 166* 159*  CALCIUM 9.3 9.8   No results found for this basename: LABPT, INR,  in the last 72 hours  EXAM General - Patient is Alert, Appropriate and Oriented Extremity - Neurovascular intact Sensation intact distally Dressing/Incision - clean, dry, no drainage Motor Function - intact, moving foot and toes well on exam.   Past Medical History  Diagnosis Date  . Hypertension   . CHF (congestive heart failure)   . Thyroid disease   . Bell's palsy   . Shingles   . History of kidney stones   . GERD (gastroesophageal reflux disease)   . H/O hiatal hernia   . Headache(784.0)     migraines  . Arthritis     Assessment/Plan: 2 Days Post-Op Procedure(s) (LRB): LEFT TOTAL KNEE ARTHROPLASTY (Left) Principal  Problem:   OA (osteoarthritis) of knee Active Problems:   Postoperative anemia due to acute blood loss  Estimated body mass index is 40.32 kg/(m^2) as calculated from the following:   Height as of this encounter: 5\' 4"  (1.626 m).   Weight as of 02/01/13: 106.595 kg (235 lb). Up with therapy Plan for discharge tomorrow Discharge to SNF  DVT Prophylaxis - Xarelto Weight-Bearing as tolerated to left leg  Donie Moulton 09/27/2013, 1:22 PM

## 2013-09-27 NOTE — Plan of Care (Signed)
Problem: Phase I Progression Outcomes Goal: Pain controlled with appropriate interventions Outcome: Progressing Pain is adequately controlled with PRN medications per patient.  Goal: OOB as tolerated unless otherwise ordered Outcome: Progressing Pt OOB to ambulate to bathroom with no difficulties.  Goal: Voiding-avoid urinary catheter unless indicated Outcome: Progressing Pt voiding.

## 2013-09-28 LAB — CBC
HCT: 31.6 % — ABNORMAL LOW (ref 36.0–46.0)
Hemoglobin: 11 g/dL — ABNORMAL LOW (ref 12.0–15.0)
MCH: 31.9 pg (ref 26.0–34.0)
MCHC: 34.8 g/dL (ref 30.0–36.0)
MCV: 91.6 fL (ref 78.0–100.0)
PLATELETS: 173 10*3/uL (ref 150–400)
RBC: 3.45 MIL/uL — ABNORMAL LOW (ref 3.87–5.11)
RDW: 12.8 % (ref 11.5–15.5)
WBC: 11.1 10*3/uL — AB (ref 4.0–10.5)

## 2013-09-28 MED ORDER — OXYCODONE HCL 5 MG PO TABS: 5.0000 mg | ORAL_TABLET | ORAL | Status: DC | PRN

## 2013-09-28 MED ORDER — RIVAROXABAN 10 MG PO TABS: 10.0000 mg | ORAL_TABLET | Freq: Every day | ORAL | Status: DC

## 2013-09-28 MED ORDER — METHOCARBAMOL 500 MG PO TABS: 500.0000 mg | ORAL_TABLET | Freq: Four times a day (QID) | ORAL | Status: DC | PRN

## 2013-09-28 MED ORDER — TRAMADOL HCL 50 MG PO TABS: 50.0000 mg | ORAL_TABLET | Freq: Four times a day (QID) | ORAL | Status: DC | PRN

## 2013-09-28 NOTE — Discharge Instructions (Signed)
° °Dr. Baxter Gonzalez °Total Joint Specialist °[facility] °[address] °[place], [place] [address] °[phone] ° °TOTAL KNEE REPLACEMENT POSTOPERATIVE DIRECTIONS ° ° ° °Knee Rehabilitation, Guidelines Following Surgery  °Results after knee surgery are often greatly improved when you follow the exercise, range of motion and muscle strengthening exercises prescribed by your doctor. Safety measures are also important to protect the knee from further injury. Any time any of these exercises cause you to have increased pain or swelling in your knee joint, decrease the amount until you are comfortable again and slowly increase them. If you have problems or questions, call your caregiver or physical therapist for advice.  ° °HOME CARE INSTRUCTIONS  °Remove items at home which could result in a fall. This includes throw rugs or furniture in walking pathways.  °Continue medications as instructed at time of discharge. °You may have some home medications which will be placed on hold until you complete the course of blood thinner medication.  °You may start showering once you are discharged home but do not submerge the incision under water. Just pat the incision dry and apply a dry gauze dressing on daily. °Walk with walker as instructed.  °You may resume a sexual relationship in one month or when given the OK by  your doctor.  °· Use walker as long as suggested by your caregivers. °· Avoid periods of inactivity such as sitting longer than an hour when not asleep. This helps prevent blood clots.  °You may put full weight on your legs and walk as much as is comfortable.  °You may return to work once you are cleared by your doctor.  °Do not drive a car for 6 weeks or until released by you surgeon.  °· Do not drive while taking narcotics.  °Wear the elastic stockings for three weeks following surgery during the day but you may remove then at night. °Make sure you keep all of your appointments after your  operation with all of your doctors and caregivers. You should call the office at the above phone number and make an appointment for approximately two weeks after the date of your surgery. °Change the dressing daily and reapply a dry dressing each time. °Please pick up a stool softener and laxative for home use as long as you are requiring pain medications. °· Continue to use ice on the knee for pain and swelling from surgery. You may notice swelling that will progress down to the foot and ankle.  This is normal after surgery.  Elevate the leg when you are not up walking on it.   °It is important for you to complete the blood thinner medication as prescribed by your doctor. °· Continue to use the breathing machine which will help keep your temperature down.  It is common for your temperature to cycle up and down following surgery, especially at night when you are not up moving around and exerting yourself.  The breathing machine keeps your lungs expanded and your temperature down. ° °RANGE OF MOTION AND STRENGTHENING EXERCISES  °Rehabilitation of the knee is important following a knee injury or an operation. After just a few days of immobilization, the muscles of the thigh which control the knee become weakened and shrink (atrophy). Knee exercises are designed to build up the tone and strength of the thigh muscles and to improve knee motion. Often times heat used for twenty to thirty minutes before working out will loosen up your tissues and help with improving the   range of motion but do not use heat for the first two weeks following surgery. These exercises can be done on a training (exercise) mat, on the floor, on a table or on a bed. Use what ever works the best and is most comfortable for you Knee exercises include:  °Leg Lifts - While your knee is still immobilized in a splint or cast, you can do straight leg raises. Lift the leg to 60 degrees, hold for 3 sec, and slowly lower the leg. Repeat 10-20 times 2-3  times daily. Perform this exercise against resistance later as your knee gets better.  °Quad and Hamstring Sets - Tighten up the muscle on the front of the thigh (Quad) and hold for 5-10 sec. Repeat this 10-20 times hourly. Hamstring sets are done by pushing the foot backward against an object and holding for 5-10 sec. Repeat as with quad sets.  °A rehabilitation program following serious knee injuries can speed recovery and prevent re-injury in the future due to weakened muscles. Contact your doctor or a physical therapist for more information on knee rehabilitation.  ° °SKILLED REHAB INSTRUCTIONS: °If the patient is transferred to a skilled rehab facility following release from the hospital, a list of the current medications will be sent to the facility for the patient to continue.  When discharged from the skilled rehab facility, please have the facility set up the patient's Home Health Physical Therapy prior to being released. Also, the skilled facility will be responsible for providing the patient with their medications at time of release from the facility to include their pain medication, the muscle relaxants, and their blood thinner medication. If the patient is still at the rehab facility at time of the two week follow up appointment, the skilled rehab facility will also need to assist the patient in arranging follow up appointment in our office and any transportation needs. ° °MAKE SURE YOU:  °Understand these instructions.  °Will watch your condition.  °Will get help right away if you are not doing well or get worse.  ° ° °Pick up stool softner and laxative for home. °Do not submerge incision under water. °May shower. °Continue to use ice for pain and swelling from surgery. ° °======================================================= ° ° °Information on my medicine - XARELTO® (Rivaroxaban) ° °This medication education was reviewed with me or my healthcare representative as part of my discharge preparation.   The pharmacist that spoke with me during my hospital stay was:  Absher, Randall K, RPH ° °Why was Xarelto® prescribed for you? °Xarelto® was prescribed for you to reduce the risk of blood clots forming after orthopedic surgery. The medical term for these abnormal blood clots is venous thromboembolism (VTE). ° °What do you need to know about xarelto® ? °Take your Xarelto® ONCE DAILY at the same time every day. °You may take it either with or without food. ° °If you have difficulty swallowing the tablet whole, you may crush it and mix in applesauce just prior to taking your dose. ° °Take Xarelto® exactly as prescribed by your doctor and DO NOT stop taking Xarelto® without talking to the doctor who prescribed the medication.  Stopping without other VTE prevention medication to take the place of Xarelto® may increase your risk of developing a clot. ° °After discharge, you should have regular check-up appointments with your healthcare provider that is prescribing your Xarelto®.   ° °What do you do if you miss a dose? °If you miss a dose, take it as soon as   you remember on the same day then continue your regularly scheduled once daily regimen the next day. Do not take two doses of Xarelto® on the same day.  ° °Important Safety Information °A possible side effect of Xarelto® is bleeding. You should call your healthcare provider right away if you experience any of the following: °  Bleeding from an injury or your nose that does not stop. °  Unusual colored urine (red or dark brown) or unusual colored stools (red or black). °  Unusual bruising for unknown reasons. °  A serious fall or if you hit your head (even if there is no bleeding). ° °Some medicines may interact with Xarelto® and might increase your risk of bleeding while on Xarelto®. To help avoid this, consult your healthcare provider or pharmacist prior to using any new prescription or non-prescription medications, including herbals, vitamins, non-steroidal  anti-inflammatory drugs (NSAIDs) and supplements. ° °This website has more information on Xarelto®: www.xarelto.com. ° ° ° ° ° ° ° ° °

## 2013-09-28 NOTE — Progress Notes (Signed)
Clinical Social Work Department CLINICAL SOCIAL WORK PLACEMENT NOTE 09/28/2013  Patient:  Kelly SkeensWOODS,Loma H  Account Number:  [id number] Admit date:  09/25/2013  Clinical Social Worker:  Cori RazorJAMIE Corene Resnick, LCSW  Date/time:  09/26/2013 12:41 PM  Clinical Social Work is seeking post-discharge placement for this patient at the following level of care:   SKILLED NURSING   (*CSW will update this form in Epic as items are completed)     Patient/family provided with [facility] Department of Clinical Social Work's list of facilities offering this level of care within the geographic area requested by the patient (or if unable, by the patient's family).  09/26/2013  Patient/family informed of their freedom to choose among providers that offer the needed level of care, that participate in Medicare, Medicaid or managed care program needed by the patient, have an available bed and are willing to accept the patient.    Patient/family informed of [facility]' ownership interest in [facility], as well as of the fact that they are under no obligation to receive care at this facility.  PASARR submitted to EDS on 09/26/2013 PASARR number received from EDS on 09/26/2013  FL2 transmitted to all facilities in geographic area requested by pt/family on  09/26/2013 FL2 transmitted to all facilities within larger geographic area on   Patient informed that his/her managed care company has contracts with or will negotiate with  certain facilities, including the following:     Patient/family informed of bed offers received:  09/26/2013 [facility] Physician recommends and patient chooses bed at    [facility]  09/28/2013 Patient to be transferred to facility by [facility]  The following physician request were entered in Epic:   Additional Comments:  Cori RazorJamie Dorsie Sethi LCSW [phone]

## 2013-09-28 NOTE — Progress Notes (Signed)
Physical Therapy Treatment Patient Details Name: Kelly Castaneda MRN: [id number] DOB: August 14, 1950 Today's Date: 09/28/2013 Time: 1100-1119 PT Time Calculation (min): 19 min  PT Assessment / Plan / Recommendation  History of Present Illness LTKA   PT Comments   Pt ambulated without KI . Plans for SNf  Follow Up Recommendations  SNF     Does the patient have the potential to tolerate intense rehabilitation     Barriers to Discharge        Equipment Recommendations  Rolling walker with 5" wheels    Recommendations for Other Services    Frequency 7X/week   Progress towards PT Goals Progress towards PT goals: Progressing toward goals  Plan Current plan remains appropriate    Precautions / Restrictions Precautions Precautions: Knee Restrictions Weight Bearing Restrictions: No   Pertinent Vitals/Pain none    Mobility  Transfers Equipment used: Rolling walker (2 wheeled) Transfers: Sit to/from Stand Sit to Stand: Supervision General transfer comment: cues for hand placement and L leg position    Exercises Total Joint Exercises Quad Sets: AROM;Left;10 reps;Supine Short Arc Quad: AAROM;Left;10 reps;Supine Knee Flexion: AAROM;Left;10 reps;Seated Goniometric ROM: 0-50   PT Diagnosis:    PT Problem List:   PT Treatment Interventions:     PT Goals (current goals can now be found in the care plan section)    Visit Information  Last PT Received On: 09/28/13 Assistance Needed: +1 History of Present Illness: LTKA    Subjective Data      Cognition  Cognition Arousal/Alertness: Awake/alert    Balance     End of Session PT - End of Session Equipment Utilized During Treatment: Gait belt Activity Tolerance: Patient tolerated treatment well Patient left: with call bell/phone within reach Nurse Communication: Mobility status CPM Left Knee CPM Left Knee: Off   GP     Rada HayHill, Halley Shepheard Elizabeth 09/28/2013, 11:24 AM

## 2013-09-28 NOTE — Discharge Summary (Signed)
Physician Discharge Summary  Patient ID: Kelly Castaneda MRN: [id number] DOB/AGE: [birth year] 63 y.o.  Admit date: 09/25/2013 Discharge date: 09/28/2013  Admission Diagnoses:Osteoarthritis left knee  Discharge Diagnoses:  Principal Problem:   OA (osteoarthritis) of knee Active Problems:   Postoperative anemia due to acute blood loss   Discharged Condition: good  Hospital Course:  Kelly Castaneda is a 63 y.o. who was admitted to [facility]. They were brought to the operating room on 09/25/2013 and underwent Procedure(s): LEFT TOTAL KNEE ARTHROPLASTY.  Patient tolerated the procedure well and was later transferred to the recovery room and then to the orthopaedic floor for postoperative care.  They were given PO and IV analgesics for pain control following their surgery.  They were given 24 hours of postoperative antibiotics of  Anti-infectives   Start     Dose/Rate Route Frequency Ordered Stop   09/25/13 2000  ceFAZolin (ANCEF) IVPB 2 g/50 mL premix     2 g 100 mL/hr over 30 Minutes Intravenous Every 6 hours 09/25/13 1637 09/26/13 0703   09/25/13 1041  ceFAZolin (ANCEF) IVPB 2 g/50 mL premix     2 g 100 mL/hr over 30 Minutes Intravenous On call to O.R. 09/25/13 1041 09/25/13 1334     and started on DVT prophylaxis in the form of Xarelto.   PT and OT were ordered for total joint protocol.  Discharge planning consulted to help with postop disposition and equipment needs.  Patient had a stable night on the evening of surgery and started to get up OOB with therapy on day one.  PCA was discontinued and they were weaned over to PO meds.  Hemovac drain was pulled without difficulty.  Continued to work with therapy into day two.  Dressing was changed on day two and the incision was clean without erythema or exudate.  By day three, the patient had progressed with therapy and meeting their goals.  Incision was healing well.  Patient was seen in rounds and was ready to go home.  Consults:  None  Significant Diagnostic Studies: none  Treatments: Physical therapy  Discharge Exam: Blood pressure 126/79, pulse 67, temperature 98 F (36.7 C), temperature source Oral, resp. rate 16, height 5\' 4"  (1.626 m), SpO2 94.00%.   Disposition: Skilled nursing facility     Medication List    STOP taking these medications       aspirin 81 MG chewable tablet     cholecalciferol 1000 UNITS tablet  Commonly known as:  VITAMIN D     naproxen sodium 220 MG tablet  Commonly known as:  ANAPROX      TAKE these medications       carvedilol 25 MG tablet  Commonly known as:  COREG  Take 25 mg by mouth 2 (two) times daily with a meal.     furosemide 80 MG tablet  Commonly known as:  LASIX  Take 80 mg by mouth every other day as needed for fluid. Pt can take up to 3 times a week     losartan 100 MG tablet  Commonly known as:  COZAAR  Take 100 mg by mouth every morning.     methocarbamol 500 MG tablet  Commonly known as:  ROBAXIN  Take 1 tablet (500 mg total) by mouth every 6 (six) hours as needed for muscle spasms.     omeprazole 20 MG capsule  Commonly known as:  PRILOSEC  Take 20 mg by mouth daily. For heartburn  oxyCODONE 5 MG immediate release tablet  Commonly known as:  Oxy IR/ROXICODONE  Take 1-2 tablets (5-10 mg total) by mouth every 3 (three) hours as needed for breakthrough pain.     rivaroxaban 10 MG Tabs tablet  Commonly known as:  XARELTO  Take 1 tablet (10 mg total) by mouth daily with breakfast.     traMADol 50 MG tablet  Commonly known as:  ULTRAM  Take 1-2 tablets (50-100 mg total) by mouth every 6 (six) hours as needed for moderate pain.           Follow-up Information   Follow up with Kelly Castaneda,Kelly Mccamy V, MD. Schedule an appointment as soon as possible for a visit on 10/10/2013. (For check up. Call [phone] tomorrow to make the appointment)    Specialty:  Orthopedic Surgery   Contact information:   [address] [place]  [id number] [phone]      Discharge instructions  As directed  Comments:  Pick up stool softner and laxative for home.  Do not submerge incision under water.  May shower.  Continue to use ice for pain and swelling from surgery.  Take Xarelto for two and a half more weeks, then discontinue Xarelto.  Once the patient has completed the Xarelto, they may resume the 81 mg Aspirin.  When discharged from the skilled rehab facility, please have the facility set up the patient's Home Health Physical Therapy prior to being released. Also provide the patient with their medications at time of release from the facility to include their pain medication, the muscle relaxants, and their blood thinner medication. If the patient is still at the rehab facility at time of follow up appointment, please also assist the patient in arranging follow up appointment in our office and any transportation needs.  Do not put a pillow under the knee. Place it under the heel.  As directed  Do not sit on low chairs, stoools or toilet seats, as it may be difficult to get up from low surfaces  As directed  Driving restrictions  As directed  Comments:  No driving until released by the physician.  Increase activity slowly as tolerated  As directed  Lifting restrictions  As directed  Comments:  No lifting until released by the physician.  Patient may shower  As directed  Comments:  You may shower without a dressing once there is no drainage. Do not wash over the wound. If drainage remains, do not shower until drainage stops.  TED hose  As directed  Comments:  Use stockings (TED hose) for 3 weeks on both leg(s). You may remove them at night for sleeping.  Weight bearing as tolerated  As directed  Questions:  Laterality:  Extremity:   Signed: Loanne Castaneda,Kelly Castaneda 09/28/2013, 11:35 AM

## 2013-09-28 NOTE — Progress Notes (Signed)
   Subjective: 3 Days Post-Op Procedure(s) (LRB): LEFT TOTAL KNEE ARTHROPLASTY (Left) Patient reports pain as mild.   Plan is to go Skilled nursing facility after hospital stay.  Objective: Vital signs in last 24 hours: Temp:  [97.6 F (36.4 C)-98.7 F (37.1 C)] 98 F (36.7 C) (02/26 0645) Pulse Rate:  [67-86] 67 (02/26 1006) Resp:  [16] 16 (02/26 0800) BP: (126-152)/(69-92) 126/79 mmHg (02/26 1006) SpO2:  [91 %-96 %] 94 % (02/26 1006)  Intake/Output from previous day:  Intake/Output Summary (Last 24 hours) at 09/28/13 1122 Last data filed at 09/28/13 0645  Gross per 24 hour  Intake    960 ml  Output      0 ml  Net    960 ml    Intake/Output this shift:    Labs:  Recent Labs  09/26/13 0404 09/27/13 0418 09/28/13 0340  HGB 11.7* 11.3* 11.0*    Recent Labs  09/27/13 0418 09/28/13 0340  WBC 13.7* 11.1*  RBC 3.72* 3.45*  HCT 34.1* 31.6*  PLT 173 173    Recent Labs  09/26/13 0404 09/27/13 0418  NA 138 140  K 3.8 4.7  CL 102 102  CO2 24 27  BUN 13 14  CREATININE 0.57 0.64  GLUCOSE 166* 159*  CALCIUM 9.3 9.8   No results found for this basename: LABPT, INR,  in the last 72 hours  EXAM General - Patient is Alert, Appropriate and Oriented Extremity - Neurologically intact Neurovascular intact Incision: dressing C/D/I No cellulitis present Compartment soft Dressing/Incision - clean, dry, no drainage Motor Function - intact, moving foot and toes well on exam.   Past Medical History  Diagnosis Date  . Hypertension   . CHF (congestive heart failure)   . Thyroid disease   . Bell's palsy   . Shingles   . History of kidney stones   . GERD (gastroesophageal reflux disease)   . H/O hiatal hernia   . Headache(784.0)     migraines  . Arthritis     Assessment/Plan: 3 Days Post-Op Procedure(s) (LRB): LEFT TOTAL KNEE ARTHROPLASTY (Left) Principal Problem:   OA (osteoarthritis) of knee Active Problems:   Postoperative anemia due to acute blood  loss   Discharge to SNF  DVT Prophylaxis - Xarelto Weight-Bearing as tolerated to left leg   Tiajah Oyster V 09/28/2013, 11:22 AM

## 2013-09-29 ENCOUNTER — Non-Acute Institutional Stay (SKILLED_NURSING_FACILITY): Payer: BC Managed Care – PPO | Admitting: Adult Health

## 2013-09-29 DIAGNOSIS — IMO0002 Reserved for concepts with insufficient information to code with codable children: Secondary | ICD-10-CM

## 2013-09-29 DIAGNOSIS — M179 Osteoarthritis of knee, unspecified: Secondary | ICD-10-CM

## 2013-09-29 DIAGNOSIS — I10 Essential (primary) hypertension: Secondary | ICD-10-CM

## 2013-09-29 DIAGNOSIS — M1710 Unilateral primary osteoarthritis, unspecified knee: Secondary | ICD-10-CM

## 2013-09-29 DIAGNOSIS — K5900 Constipation, unspecified: Secondary | ICD-10-CM

## 2013-09-29 DIAGNOSIS — K219 Gastro-esophageal reflux disease without esophagitis: Secondary | ICD-10-CM

## 2013-09-29 DIAGNOSIS — I509 Heart failure, unspecified: Secondary | ICD-10-CM

## 2013-10-02 DIAGNOSIS — K219 Gastro-esophageal reflux disease without esophagitis: Secondary | ICD-10-CM | POA: Insufficient documentation

## 2013-10-02 DIAGNOSIS — I509 Heart failure, unspecified: Secondary | ICD-10-CM | POA: Insufficient documentation

## 2013-10-02 DIAGNOSIS — K5900 Constipation, unspecified: Secondary | ICD-10-CM | POA: Insufficient documentation

## 2013-10-02 DIAGNOSIS — I10 Essential (primary) hypertension: Secondary | ICD-10-CM | POA: Insufficient documentation

## 2013-10-02 NOTE — Progress Notes (Signed)
Patient ID: Kelly Castaneda, female   DOB: 1951-03-05, 63 y.o.   MRN: [id number]     [facility]  Allergies  Allergen Reactions  . Codeine Nausea And Vomiting     Chief Complaint  Patient presents with  . Hospitalization Follow-up    HPI:  She has been hospitalized for a left knee replacement. She is here for short term rehab with her goal to return back home. She is not complaining of pain at this time. She states her pain medication is effective.   Past Medical History  Diagnosis Date  . Hypertension   . CHF (congestive heart failure)   . Thyroid disease   . Bell's palsy   . Shingles   . History of kidney stones   . GERD (gastroesophageal reflux disease)   . H/O hiatal hernia   . Headache(784.0)     migraines  . Arthritis     Past Surgical History  Procedure Laterality Date  . Abdominal hysterectomy    . Cholecystectomy    . Tubal ligation    . Dilation and curettage of uterus    . Cardiac catheterization      "years ago"  . Back surgery      lumbar/ discectomy  . Breast surgery Right     biopsy with titanium clip placement  . Parathyroidectomy Right   . Lithotripsy    . Total knee arthroplasty Left 09/25/2013    Procedure: LEFT TOTAL KNEE ARTHROPLASTY;  Surgeon: [name], MD;  Location: [facility];  Service: Orthopedics;  Laterality: Left;    VITAL SIGNS BP 137/79  Pulse 80  Ht 5\' 4"  (1.626 m)  Wt 237 lb (107.502 kg)  BMI 40.66 kg/m2   Patient's Medications  New Prescriptions   No medications on file  Previous Medications   CARVEDILOL (COREG) 25 MG TABLET    Take 25 mg by mouth 2 (two) times daily with a meal.   DOCUSATE SODIUM (COLACE) 100 MG CAPSULE    Take 100 mg by mouth 2 (two) times daily.   LOSARTAN (COZAAR) 100 MG TABLET    Take 100 mg by mouth every morning.   METHOCARBAMOL (ROBAXIN) 500 MG TABLET    Take 1 tablet (500 mg total) by mouth every 6 (six) hours as needed for muscle spasms.   OMEPRAZOLE (PRILOSEC) 20 MG CAPSULE    Take 20  mg by mouth daily. For heartburn   OXYCODONE (OXY IR/ROXICODONE) 5 MG IMMEDIATE RELEASE TABLET    Take 1-2 tablets (5-10 mg total) by mouth every 3 (three) hours as needed for breakthrough pain.   RIVAROXABAN (XARELTO) 10 MG TABS TABLET    Take 1 tablet (10 mg total) by mouth daily with breakfast.   TRAMADOL (ULTRAM) 50 MG TABLET    Take 1-2 tablets (50-100 mg total) by mouth every 6 (six) hours as needed for moderate pain.  Modified Medications   No medications on file  Discontinued Medications   FUROSEMIDE (LASIX) 80 MG TABLET    Take 80 mg by mouth every other day as needed for fluid. Pt can take up to 3 times a week    SIGNIFICANT DIAGNOSTIC EXAMS   NONE RECENT   LABS REVIEWED:   09-21-13; wbc 6.1; hgb 4.7; hct 43.8; mcv 93.8; plt 203; glucose 84; bun 16; creat 0.80; k+4.8; na++ 140 t protein 8.4; albumin 3.9 09-28-13: wbc 11.1; hgb 11.0; hct 31.6; mcv 91.6 ;plt 173     Review of Systems  Constitutional: Negative  for malaise/fatigue.  Respiratory: Negative for cough and shortness of breath.   Cardiovascular: Negative for chest pain and palpitations.  Gastrointestinal: Negative for heartburn, abdominal pain and constipation.  Musculoskeletal: Negative for back pain and myalgias.       Left hip pain is managed   Skin: Negative.   Neurological: Negative for weakness and headaches.  Psychiatric/Behavioral: Negative for depression. The patient is not nervous/anxious.       Physical Exam  Constitutional: She is oriented to person, place, and time. She appears well-developed and well-nourished. No distress.  obese  Neck: Neck supple. No JVD present.  Cardiovascular: Normal rate and intact distal pulses.   Respiratory: Effort normal and breath sounds normal. No respiratory distress.  GI: Soft. Bowel sounds are normal. She exhibits no distension. There is no tenderness.  Musculoskeletal: She exhibits no edema.  Is able to move all extremities; has some limited range of motion in  left knee is status post replacement  Neurological: She is alert and oriented to person, place, and time.  Skin: Skin is warm and dry. She is not diaphoretic.  Left knee incision line without signs of infection present.   Psychiatric: She has a normal mood and affect.      ASSESSMENT/ PLAN:  1. Osteoarthritis of left knee: is status post replacement; will continue therapy as directed; will continue ultram 50 to 100 mg every 6 hours as needed for pain or oxycodone 5-10 mg every 3 hours as needed for pain. Will continue robaxin 500 mg every 6 hours as needed for spasms. Will continue xarelto 10 mg daily for 2 1/2 weeks. Will continue to monitor her status.   2. CHF: is stable will continue coreg 25 mg twice daily; will weigh her daily; to determine use of lasix as needed and will monitor her status   3. Hypertension: is stable will continue coreg 25 mg twice daily; cozaar 100 mg daily   4. Gerd: will continue prilosec 20 mg daily    5. Constipation: will continue colace twice daily   Will check cbc next week.    Time spent with patient 50 minutes.    Synthia Innocenteborah Green NP [facility]  Contact [phone] Monday through Friday 8am- 5pm  After hours call [phone]

## 2013-10-05 ENCOUNTER — Non-Acute Institutional Stay (SKILLED_NURSING_FACILITY): Payer: BC Managed Care – PPO | Admitting: Internal Medicine

## 2013-10-05 ENCOUNTER — Encounter: Payer: Self-pay | Admitting: Internal Medicine

## 2013-10-05 DIAGNOSIS — M179 Osteoarthritis of knee, unspecified: Secondary | ICD-10-CM

## 2013-10-05 DIAGNOSIS — IMO0002 Reserved for concepts with insufficient information to code with codable children: Secondary | ICD-10-CM

## 2013-10-05 DIAGNOSIS — M1710 Unilateral primary osteoarthritis, unspecified knee: Secondary | ICD-10-CM

## 2013-10-05 DIAGNOSIS — I509 Heart failure, unspecified: Secondary | ICD-10-CM

## 2013-10-05 DIAGNOSIS — K5900 Constipation, unspecified: Secondary | ICD-10-CM

## 2013-10-05 DIAGNOSIS — K219 Gastro-esophageal reflux disease without esophagitis: Secondary | ICD-10-CM

## 2013-10-05 DIAGNOSIS — D62 Acute posthemorrhagic anemia: Secondary | ICD-10-CM

## 2013-10-05 DIAGNOSIS — I10 Essential (primary) hypertension: Secondary | ICD-10-CM

## 2013-10-05 NOTE — Progress Notes (Signed)
Patient ID: Kelly Castaneda, female   DOB: Jun 27, 1951, 63 y.o.   MRN: [id number]     [facility]   PCP: Pearla Dubonnet, MD  Code Status: full code  Allergies  Allergen Reactions  . Codeine Nausea And Vomiting    Chief Complaint: new admit  HPI:  63 y/o female patient is here for STR post hospital admission from 09/25/13- 09/28/13 with severe left knee OA. She underwent left total knee arthroplasty. Her pain is currently under control. She denies any complaints. She has been working with therapy team.  Review of Systems:  Constitutional: Negative for fever, chills, weight loss, malaise/fatigue and diaphoresis.  HENT: Negative for congestion, hearing loss and sore throat.   Eyes: Negative for eye pain, blurred vision, double vision and discharge.  Respiratory: Negative for cough, sputum production, shortness of breath and wheezing.   Cardiovascular: Negative for chest pain, palpitations, orthopnea and leg swelling.  Gastrointestinal: Negative for heartburn, nausea, vomiting, abdominal pain, diarrhea and constipation.  Genitourinary: Negative for dysuria, urgency, frequency, hematuria and flank pain.  Musculoskeletal: Negative for back pain, falls, myalgias.  Skin: Negative for itching and rash.  Neurological: Negative for weakness,dizziness, tingling, focal weakness and headaches.  Psychiatric/Behavioral: Negative for depression and memory loss. The patient is not nervous/anxious.     Past Medical History  Diagnosis Date  . Hypertension   . CHF (congestive heart failure)   . Thyroid disease   . Bell's palsy   . Shingles   . History of kidney stones   . GERD (gastroesophageal reflux disease)   . H/O hiatal hernia   . Headache(784.0)     migraines  . Arthritis    Past Surgical History  Procedure Laterality Date  . Abdominal hysterectomy    . Cholecystectomy    . Tubal ligation    . Dilation and curettage of uterus    . Cardiac catheterization     "years ago"  . Back surgery      lumbar/ discectomy  . Breast surgery Right     biopsy with titanium clip placement  . Parathyroidectomy Right   . Lithotripsy    . Total knee arthroplasty Left 09/25/2013    Procedure: LEFT TOTAL KNEE ARTHROPLASTY;  Surgeon: [name], MD;  Location: [facility];  Service: Orthopedics;  Laterality: Left;   Social History:   reports that she has never smoked. She has never used smokeless tobacco. She reports that she does not drink alcohol or use illicit drugs.  No family history on file.  Medications: Patient's Medications  New Prescriptions   No medications on file  Previous Medications   CARVEDILOL (COREG) 25 MG TABLET    Take 25 mg by mouth 2 (two) times daily with a meal.   DOCUSATE SODIUM (COLACE) 100 MG CAPSULE    Take 100 mg by mouth 2 (two) times daily.   LOSARTAN (COZAAR) 100 MG TABLET    Take 100 mg by mouth every morning.   METHOCARBAMOL (ROBAXIN) 500 MG TABLET    Take 1 tablet (500 mg total) by mouth every 6 (six) hours as needed for muscle spasms.   OMEPRAZOLE (PRILOSEC) 20 MG CAPSULE    Take 20 mg by mouth daily. For heartburn   OXYCODONE (OXY IR/ROXICODONE) 5 MG IMMEDIATE RELEASE TABLET    Take 1-2 tablets (5-10 mg total) by mouth every 3 (three) hours as needed for breakthrough pain.   RIVAROXABAN (XARELTO) 10 MG TABS TABLET    Take 1 tablet (10  mg total) by mouth daily with breakfast.   TRAMADOL (ULTRAM) 50 MG TABLET    Take 1-2 tablets (50-100 mg total) by mouth every 6 (six) hours as needed for moderate pain.  Modified Medications   No medications on file  Discontinued Medications   No medications on file     Physical Exam: BP 115/60  Pulse 80  Temp(Src) 97.6 F (36.4 C)  Resp 18  SpO2 97%  General- elderly female in no acute distress Head- atraumatic, normocephalic Eyes- PERRLA, EOMI, no pallor, no icterus, no discharge Neck- no lymphadenopathy, neck supple Cardiovascular- normal s1,s2, no murmurs/ rubs/  gallops Respiratory- bilateral clear to auscultation, no wheeze, no rhonchi, no crackles, no use of accessory muscles Abdomen- bowel sounds present, soft, non tender Musculoskeletal- able to move all 4 extremities, ROM in left knee limited.  Trace left leg edema Neurological- no focal deficit Skin- warm and dry, incision site healing well, dressing clean and dry Psychiatry- alert and oriented to person, place and time, normal mood and affect   Labs reviewed: Basic Metabolic Panel:  Recent Labs  16/05/9601/19/15 1415 09/26/13 0404 09/27/13 0418  NA 140 138 140  K 4.8 3.8 4.7  CL 101 102 102  CO2 26 24 27   GLUCOSE 84 166* 159*  BUN 16 13 14   CREATININE 0.80 0.57 0.64  CALCIUM 10.4 9.3 9.8   Liver Function Tests:  Recent Labs  09/21/13 1415  AST 19  ALT 14  ALKPHOS 63  BILITOT <0.2*  PROT 8.4*  ALBUMIN 3.9   No results found for this basename: LIPASE, AMYLASE,  in the last 8760 hours No results found for this basename: AMMONIA,  in the last 8760 hours CBC:  Recent Labs  02/01/13 1610  09/26/13 0404 09/27/13 0418 09/28/13 0340  WBC 12.3*  < > 8.1 13.7* 11.1*  NEUTROABS 9.5*  --   --   --   --   HGB 13.8  < > 11.7* 11.3* 11.0*  HCT 39.7  < > 34.4* 34.1* 31.6*  MCV 92.1  < > 90.8 91.7 91.6  PLT 216  < > 173 173 173  < > = values in this interval not displayed.  09/05/13- wbc 6.6, hb 10.5, hct 32.6, plt 264  Assessment/Plan  Osteoarthritis of left knee- is status post replacement. Will have her work with PT and OT. Continue ultram and robaxin with xarelto for dvt prophylaxis. Fall precautions. Continue wearing ted hose, dressing care  Anemia-post surgery, reviewed h/h. Stable at present  Hypertension- stable on coreg 25 mg twice daily and cozaar 100 mg daily   CHF- stable. Continue b blocker and ARB, monitor weight and breathing  Genella RifeGerd- continue prilosec 20 mg daily    Constipation- continue colace twice daily    Family/ staff Communication: reviewed care plan  with patient and nursing supervisor  Goals of care: STR   Labs/tests ordered: none    Oneal GroutMAHIMA Dow Blahnik, MD  [facility] [phone] (Monday-Friday 8 am - 5 pm) [phone] (afterhours)

## 2013-10-12 ENCOUNTER — Non-Acute Institutional Stay (SKILLED_NURSING_FACILITY): Payer: BC Managed Care – PPO | Admitting: Adult Health

## 2013-10-12 DIAGNOSIS — M179 Osteoarthritis of knee, unspecified: Secondary | ICD-10-CM

## 2013-10-12 DIAGNOSIS — D62 Acute posthemorrhagic anemia: Secondary | ICD-10-CM

## 2013-10-12 DIAGNOSIS — IMO0002 Reserved for concepts with insufficient information to code with codable children: Secondary | ICD-10-CM

## 2013-10-12 DIAGNOSIS — M1710 Unilateral primary osteoarthritis, unspecified knee: Secondary | ICD-10-CM

## 2013-10-12 DIAGNOSIS — I10 Essential (primary) hypertension: Secondary | ICD-10-CM

## 2013-10-15 ENCOUNTER — Encounter: Payer: Self-pay | Admitting: Adult Health

## 2013-10-15 NOTE — Progress Notes (Signed)
Patient ID: Kelly Castaneda, female   DOB: Nov 08, 1950, 63 y.o.   MRN: [id number]     [facility]  Allergies  Allergen Reactions  . Codeine Nausea And Vomiting     Chief Complaint  Patient presents with  . Discharge Note    HPI:  She is being discharged to home with home health for pt/ot; will not need nursing services.  She had been hospitalized for a left knee replacement. She will need prescriptions to be written.     Past Medical History  Diagnosis Date  . Hypertension   . CHF (congestive heart failure)   . Thyroid disease   . Bell's palsy   . Shingles   . History of kidney stones   . GERD (gastroesophageal reflux disease)   . H/O hiatal hernia   . Headache(784.0)     migraines  . Arthritis     Past Surgical History  Procedure Laterality Date  . Abdominal hysterectomy    . Cholecystectomy    . Tubal ligation    . Dilation and curettage of uterus    . Cardiac catheterization      "years ago"  . Back surgery      lumbar/ discectomy  . Breast surgery Right     biopsy with titanium clip placement  . Parathyroidectomy Right   . Lithotripsy    . Total knee arthroplasty Left 09/25/2013    Procedure: LEFT TOTAL KNEE ARTHROPLASTY;  Surgeon: Loanne DrillingFrank V Aluisio, MD;  Location: [facility];  Service: Orthopedics;  Laterality: Left;    VITAL SIGNS BP 122/71  Pulse 72  Ht 5\' 4"  (1.626 m)  Wt 233 lb (105.688 kg)  BMI 39.97 kg/m2   Patient's Medications  New Prescriptions   No medications on file  Previous Medications   CARVEDILOL (COREG) 25 MG TABLET    Take 25 mg by mouth 2 (two) times daily with a meal.   DOCUSATE SODIUM (COLACE) 100 MG CAPSULE    Take 100 mg by mouth 2 (two) times daily.   LOSARTAN (COZAAR) 100 MG TABLET    Take 100 mg by mouth every morning.   METHOCARBAMOL (ROBAXIN) 500 MG TABLET    Take 1 tablet (500 mg total) by mouth every 6 (six) hours as needed for muscle spasms.   OMEPRAZOLE (PRILOSEC) 20 MG CAPSULE    Take 20 mg by mouth daily. For  heartburn   OXYCODONE (OXY IR/ROXICODONE) 5 MG IMMEDIATE RELEASE TABLET    Take 1-2 tablets (5-10 mg total) by mouth every 3 (three) hours as needed for breakthrough pain.   RIVAROXABAN (XARELTO) 10 MG TABS TABLET    Take 1 tablet (10 mg total) by mouth daily with breakfast.   TRAMADOL (ULTRAM) 50 MG TABLET    Take 1-2 tablets (50-100 mg total) by mouth every 6 (six) hours as needed for moderate pain.  Modified Medications   No medications on file  Discontinued Medications   No medications on file    SIGNIFICANT DIAGNOSTIC EXAMS  NONE RECENT   LABS REVIEWED:   09-21-13; wbc 6.1; hgb 4.7; hct 43.8; mcv 93.8; plt 203; glucose 84; bun 16; creat 0.80; k+4.8; na++ 140 t protein 8.4; albumin 3.9 09-28-13: wbc 11.1; hgb 11.0; hct 31.6; mcv 91.6 ;plt 173     Review of Systems  Constitutional: Negative for malaise/fatigue.  Respiratory: Negative for cough and shortness of breath.   Cardiovascular: Negative for chest pain and palpitations.  Gastrointestinal: Negative for heartburn, abdominal pain and constipation.  Musculoskeletal: Negative for back pain and myalgias.       Left hip pain is managed   Skin: Negative.   Neurological: Negative for weakness and headaches.  Psychiatric/Behavioral: Negative for depression. The patient is not nervous/anxious.       Physical Exam  Constitutional: She is oriented to person, place, and time. She appears well-developed and well-nourished. No distress.  obese  Neck: Neck supple. No JVD present.  Cardiovascular: Normal rate and intact distal pulses.   Respiratory: Effort normal and breath sounds normal. No respiratory distress.  GI: Soft. Bowel sounds are normal. She exhibits no distension. There is no tenderness.  Musculoskeletal: She exhibits no edema.  Is able to move all extremities; has some limited range of motion in left knee is status post replacement  Neurological: She is alert and oriented to person, place, and time.  Skin: Skin is warm  and dry. She is not diaphoretic.  Left knee incision line without signs of infection present.   Psychiatric: She has a normal mood and affect.        ASSESSMENT/ PLAN:  Will discharge to home with home health for pt/ot; will not need nursing services. She will not need dme. Her prescriptions have been written.   Time spent with patient 35 minutes.       Synthia Innocent NP [facility]  Contact [phone] Monday through Friday 8am- 5pm  After hours call [phone]

## 2013-12-15 ENCOUNTER — Other Ambulatory Visit: Payer: Self-pay | Admitting: Adult Health

## 2014-01-04 ENCOUNTER — Other Ambulatory Visit: Payer: Self-pay

## 2014-01-04 DIAGNOSIS — Z1231 Encounter for screening mammogram for malignant neoplasm of breast: Secondary | ICD-10-CM

## 2014-01-22 ENCOUNTER — Ambulatory Visit
Admission: RE | Admit: 2014-01-22 | Discharge: 2014-01-22 | Disposition: A | Discharge status: Home / Self Care | Payer: BC Managed Care – PPO | Source: Ambulatory Visit

## 2014-01-22 DIAGNOSIS — Z1231 Encounter for screening mammogram for malignant neoplasm of breast: Secondary | ICD-10-CM

## 2014-03-14 ENCOUNTER — Telehealth: Payer: Self-pay | Admitting: *Deleted

## 2014-03-14 NOTE — Telephone Encounter (Signed)
[facility] requests furosemide 80mg  refill. Thanks, MI

## 2014-03-15 ENCOUNTER — Other Ambulatory Visit: Payer: Self-pay | Admitting: *Deleted

## 2014-03-15 ENCOUNTER — Telehealth: Payer: Self-pay

## 2014-03-15 MED ORDER — FUROSEMIDE 80 MG PO TABS: ORAL_TABLET | ORAL | Status: DC

## 2014-03-15 NOTE — Telephone Encounter (Signed)
I did not see the note, and sometime I do not have a lot of time to read every note

## 2014-03-15 NOTE — Telephone Encounter (Signed)
See telephone note I sent to Mindy.

## 2014-03-15 NOTE — Telephone Encounter (Signed)
Ok for refill for 30 days, future refills to PCP. [facility] show pt only to follow up with Dr. Eldridge DaceVaranasi as needed. Med list shows lasix 80 mg up to 4 times a week.

## 2014-05-18 ENCOUNTER — Other Ambulatory Visit: Payer: Self-pay

## 2014-06-26 ENCOUNTER — Encounter: Payer: Self-pay | Admitting: *Deleted

## 2014-12-01 ENCOUNTER — Emergency Department (HOSPITAL_COMMUNITY)
Admission: EM | Admit: 2014-12-01 | Discharge: 2014-12-01 | Disposition: A | Discharge status: Home / Self Care | Payer: Medicare Other | Attending: Emergency Medicine | Admitting: Emergency Medicine

## 2014-12-01 ENCOUNTER — Encounter (HOSPITAL_COMMUNITY): Payer: Self-pay

## 2014-12-01 ENCOUNTER — Emergency Department (HOSPITAL_COMMUNITY): Payer: Medicare Other

## 2014-12-01 DIAGNOSIS — Z87442 Personal history of urinary calculi: Secondary | ICD-10-CM | POA: Insufficient documentation

## 2014-12-01 DIAGNOSIS — Z8669 Personal history of other diseases of the nervous system and sense organs: Secondary | ICD-10-CM | POA: Insufficient documentation

## 2014-12-01 DIAGNOSIS — R112 Nausea with vomiting, unspecified: Secondary | ICD-10-CM | POA: Insufficient documentation

## 2014-12-01 DIAGNOSIS — Z79899 Other long term (current) drug therapy: Secondary | ICD-10-CM | POA: Diagnosis not present

## 2014-12-01 DIAGNOSIS — I10 Essential (primary) hypertension: Secondary | ICD-10-CM | POA: Diagnosis not present

## 2014-12-01 DIAGNOSIS — I509 Heart failure, unspecified: Secondary | ICD-10-CM | POA: Diagnosis not present

## 2014-12-01 DIAGNOSIS — Z8619 Personal history of other infectious and parasitic diseases: Secondary | ICD-10-CM | POA: Diagnosis not present

## 2014-12-01 DIAGNOSIS — Z8639 Personal history of other endocrine, nutritional and metabolic disease: Secondary | ICD-10-CM | POA: Diagnosis not present

## 2014-12-01 DIAGNOSIS — K219 Gastro-esophageal reflux disease without esophagitis: Secondary | ICD-10-CM | POA: Diagnosis not present

## 2014-12-01 DIAGNOSIS — Z8739 Personal history of other diseases of the musculoskeletal system and connective tissue: Secondary | ICD-10-CM | POA: Diagnosis not present

## 2014-12-01 LAB — CBC WITH DIFFERENTIAL/PLATELET
Basophils Absolute: 0 10*3/uL (ref 0.0–0.1)
Basophils Relative: 0 % (ref 0–1)
EOS PCT: 0 % (ref 0–5)
Eosinophils Absolute: 0 10*3/uL (ref 0.0–0.7)
HEMATOCRIT: 38.3 % (ref 36.0–46.0)
Hemoglobin: 13 g/dL (ref 12.0–15.0)
LYMPHS ABS: 1.1 10*3/uL (ref 0.7–4.0)
LYMPHS PCT: 11 % — AB (ref 12–46)
MCH: 31.1 pg (ref 26.0–34.0)
MCHC: 33.9 g/dL (ref 30.0–36.0)
MCV: 91.6 fL (ref 78.0–100.0)
MONOS PCT: 3 % (ref 3–12)
Monocytes Absolute: 0.3 10*3/uL (ref 0.1–1.0)
Neutro Abs: 8.6 10*3/uL — ABNORMAL HIGH (ref 1.7–7.7)
Neutrophils Relative %: 86 % — ABNORMAL HIGH (ref 43–77)
PLATELETS: 214 10*3/uL (ref 150–400)
RBC: 4.18 MIL/uL (ref 3.87–5.11)
RDW: 12.6 % (ref 11.5–15.5)
WBC: 10 10*3/uL (ref 4.0–10.5)

## 2014-12-01 LAB — HEPATIC FUNCTION PANEL
ALT: 30 U/L (ref 0–35)
AST: 32 U/L (ref 0–37)
Albumin: 4.1 g/dL (ref 3.5–5.2)
Alkaline Phosphatase: 59 U/L (ref 39–117)
BILIRUBIN TOTAL: 0.6 mg/dL (ref 0.3–1.2)
Total Protein: 7.8 g/dL (ref 6.0–8.3)

## 2014-12-01 LAB — BASIC METABOLIC PANEL
Anion gap: 12 (ref 5–15)
BUN: 23 mg/dL (ref 6–23)
CALCIUM: 10.2 mg/dL (ref 8.4–10.5)
CHLORIDE: 103 mmol/L (ref 96–112)
CO2: 26 mmol/L (ref 19–32)
CREATININE: 1.01 mg/dL (ref 0.50–1.10)
GFR calc non Af Amer: 57 mL/min — ABNORMAL LOW (ref >90)
GFR, EST AFRICAN AMERICAN: 66 mL/min — AB (ref >90)
Glucose, Bld: 123 mg/dL — ABNORMAL HIGH (ref 70–99)
Potassium: 4.5 mmol/L (ref 3.5–5.1)
Sodium: 141 mmol/L (ref 135–145)

## 2014-12-01 LAB — LIPASE, BLOOD: Lipase: 29 U/L (ref 11–59)

## 2014-12-01 LAB — I-STAT TROPONIN, ED: Troponin i, poc: 0 ng/mL (ref 0.00–0.08)

## 2014-12-01 MED ORDER — ONDANSETRON 4 MG PO TBDP: 4.0000 mg | ORAL_TABLET | Freq: Three times a day (TID) | ORAL | Status: DC | PRN

## 2014-12-01 MED ORDER — ONDANSETRON HCL 4 MG/2ML IJ SOLN
4.0000 mg | Freq: Once | INTRAMUSCULAR | Status: AC
  Administered 2014-12-01: 4 mg via INTRAVENOUS
  Filled 2014-12-01: qty 2

## 2014-12-01 MED ORDER — SODIUM CHLORIDE 0.9 % IV BOLUS (SEPSIS)
500.0000 mL | Freq: Once | INTRAVENOUS | Status: AC
Start: 2014-12-01 — End: 2014-12-01
  Administered 2014-12-01: 500 mL via INTRAVENOUS

## 2014-12-01 NOTE — Discharge Instructions (Signed)
Take the prescribed medication as directed to help with nausea. Make sure to drink plenty of fluids to stay hydrated. You may wish to start with bland diet and progress back to normal as tolerated. Follow-up with your primary care physician. Return to the ED for new or worsening symptoms-- abdominal pain, uncontrollable nausea and vomiting, high fever, chills, dizziness, etc.

## 2014-12-01 NOTE — ED Provider Notes (Signed)
CSN: [id number]     Arrival date & time 12/01/14  1730 History   First MD Initiated Contact with Patient 12/01/14 1756     Chief Complaint  Patient presents with  . Emesis     (Consider location/radiation/quality/duration/timing/severity/associated sxs/prior Treatment) Patient is a 64 y.o. female presenting with vomiting. The history is provided by the patient and medical records.  Emesis Associated symptoms: no abdominal pain    This is a 64 year old female with past medical history significant for hypertension, congestive heart failure, thyroid disease, kidney stones, GERD, migraine headaches, presenting to the ED for nausea and vomiting since around noon today. Symptoms were sudden in onset after attempting to eat breakfast around 1100.  Patient denies recent changes in diet, abnormal food intake, travel, or known sick contacts. She states she threw up twice and went to go light on her couch, she fell asleep for a brief period of time and woke up in the floor. She is not entirely sure how she ended up in the floor but she was able to get up on her own and go take a shower. No further syncope.  She states after her shower she continued vomiting. She estimates approximately 12 episodes of nonbloody, nonbilious emesis throughout the day today. She now has dry heaves. She did have a loose bowel movement this morning but denies diarrhea. No melena or hematochezia. She denies any current abdominal pain.  Bowel movements have been regular, she is freely passing flatus. No chest pain or shortness of breath. She states she feels generally weak but denies any dizziness or lightheadedness. Prior abdominal surgeries include hysterectomy, cholecystectomy, tubal ligation, uterine D&C.  Past Medical History  Diagnosis Date  . Hypertension   . CHF (congestive heart failure)   . Thyroid disease   . Bell's palsy   . Shingles   . History of kidney stones   . GERD (gastroesophageal reflux disease)   . H/O  hiatal hernia   . Headache(784.0)     migraines  . Arthritis    Past Surgical History  Procedure Laterality Date  . Abdominal hysterectomy    . Cholecystectomy    . Tubal ligation    . Dilation and curettage of uterus    . Cardiac catheterization      "years ago"  . Back surgery      lumbar/ discectomy  . Breast surgery Right     biopsy with titanium clip placement  . Parathyroidectomy Right   . Lithotripsy    . Total knee arthroplasty Left 09/25/2013    Procedure: LEFT TOTAL KNEE ARTHROPLASTY;  Surgeon: [name], MD;  Location: [facility];  Service: Orthopedics;  Laterality: Left;   Family History  Problem Relation Age of Onset  . Diabetes Mother   . CAD Father    History  Substance Use Topics  . Smoking status: Never Smoker   . Smokeless tobacco: Never Used  . Alcohol Use: No   OB History    No data available     Review of Systems  Gastrointestinal: Positive for nausea and vomiting. Negative for abdominal pain.  All other systems reviewed and are negative.     Allergies  Codeine  Home Medications   Prior to Admission medications   Medication Sig Start Date End Date Taking? Authorizing Provider  carvedilol (COREG) 25 MG tablet Take 25 mg by mouth 2 (two) times daily with a meal.    Historical Provider, MD  Cholecalciferol (VITAMIN D) 2000 UNITS tablet Take  2,000 Units by mouth daily.    Historical Provider, MD  furosemide (LASIX) 80 MG tablet Take one tablet up to four times weekly. 03/15/14   Corky CraftsJayadeep S Varanasi, MD  losartan (COZAAR) 100 MG tablet Take 100 mg by mouth every morning. 09/06/13   Corky CraftsJayadeep S Varanasi, MD  omeprazole (PRILOSEC) 20 MG capsule Take 20 mg by mouth daily. For heartburn    Historical Provider, MD   BP 189/55 mmHg  Pulse 70  Temp(Src) 97.8 F (36.6 C) (Oral)  Resp 15  Ht 5\' 5"  (1.651 m)  Wt 235 lb (106.595 kg)  BMI 39.11 kg/m2  SpO2 99%   Physical Exam  Constitutional: She is oriented to person, place, and time. She appears  well-developed and well-nourished.  Dry heaving  HENT:  Head: Normocephalic and atraumatic.  Mouth/Throat: Oropharynx is clear and moist.  Eyes: Conjunctivae and EOM are normal. Pupils are equal, round, and reactive to light.  Neck: Normal range of motion.  Cardiovascular: Normal rate, regular rhythm and normal heart sounds.   Pulmonary/Chest: Effort normal and breath sounds normal.  Abdominal: Soft. Bowel sounds are normal. There is no tenderness. There is no rigidity, no guarding and no CVA tenderness.  Obese abdomen which remains soft; no noted distention; no focal tenderness or peritoneal signs  Musculoskeletal: Normal range of motion.  Neurological: She is alert and oriented to person, place, and time.  Skin: Skin is warm and dry.  Psychiatric: She has a normal mood and affect.  Nursing note and vitals reviewed.   ED Course  Procedures (including critical care time) Labs Review Labs Reviewed  CBC WITH DIFFERENTIAL/PLATELET - Abnormal; Notable for the following:    Neutrophils Relative % 86 (*)    Neutro Abs 8.6 (*)    Lymphocytes Relative 11 (*)    All other components within normal limits  BASIC METABOLIC PANEL - Abnormal; Notable for the following:    Glucose, Bld 123 (*)    GFR calc non Af Amer 57 (*)    GFR calc Af Amer 66 (*)    All other components within normal limits  LIPASE, BLOOD  HEPATIC FUNCTION PANEL  URINALYSIS, ROUTINE W REFLEX MICROSCOPIC  I-STAT TROPOININ, ED    Imaging Review Dg Abd Acute W/chest  12/01/2014   CLINICAL DATA:  Nausea and vomiting since this morning. Syncopal episode.  EXAM: DG ABDOMEN ACUTE W/ 1V CHEST  COMPARISON:  Chest radiograph on 02/01/2013  FINDINGS: There is no evidence of dilated bowel loops or free intraperitoneal air. No radiopaque calculi or other significant radiographic abnormality is seen. Right upper quadrant surgical clips seen from prior cholecystectomy. Surgical clip also noted in the left pelvis. Lower lumbar spine  degenerative changes noted.  Heart size and mediastinal contours are within normal limits. Both lungs are clear. No evidence of pneumothorax or pleural effusion. Mild elevation of right hemidiaphragm remains stable.  IMPRESSION: Normal bowel gas pattern.  No active cardiopulmonary disease.   Electronically Signed   By: Myles RosenthalJohn  Stahl M.D.   On: 12/01/2014 18:42     EKG Interpretation None      MDM   Final diagnoses:  Nausea and vomiting, vomiting of unspecified type   64 year old female here with nausea and vomiting beginning around noon today. She denies any current abdominal pain. Per triage noted there was syncope, however patient reports to me that she fell asleep on the couch but somehow ended up in the floor-- not entirely sure if she rolled off, however there was  no further syncopal symptoms.  On exam, patient is afebrile and nontoxic in appearance. She is actively dry heaving but after belching a few times is requesting ginger ale to drink.  She has an obese abdomen but is soft without focal tenderness or peritoneal signs. She admits to regular bowel movements and is freely passing flatus. She has no history of bowel obstruction in the past. Will obtain labs and acute abdominal series. Patient was given small bolus of IV fluids and IV Zofran. Will monitor closely.  Labwork is overall reassuring, no significant electrolyte imbalance. Troponin is negative. Acute abdominal series without signs of perforation or obstruction. After IV fluids and Zofran patient states she is feeling much better. She continues to deny any abdominal pain, chest pain, or SOB.  She continues to deny and current dizziness, lightheadedness, or feelings of syncope. She has tolerated 2 cups of gingerale without recurrence of symptoms.  She remains afebrile with stable VS.  Suspect her symptoms may be due to viral gastroenteritis which have fully resolved at this time.  Patient states that she felt comfortable returning home at  this time. Will discharge with some symptomatic care. Encouraged to continue drinking fluids to stay hydrated, may wish to start with bland diet and progress back to normal as tolerated. Patient to follow-up with her PCP.  Discussed plan with patient, he/she acknowledged understanding and agreed with plan of care.  Return precautions given for new or worsening symptoms.  Garlon Hatchet, PA-C 12/01/14 2218  Garlon Hatchet, PA-C 12/01/14 1610  Arby Barrette, MD 12/03/14 [phone]

## 2014-12-01 NOTE — ED Notes (Signed)
Pt reports a syncopal episode this morning and n/v since 1200 this afternoon.  Pt denies any pain at this time.  Pt reports she had diarrhea once this morning but none since then and has thrown up aprox. 12 times.

## 2015-01-23 ENCOUNTER — Other Ambulatory Visit: Payer: Self-pay

## 2015-01-23 DIAGNOSIS — Z1231 Encounter for screening mammogram for malignant neoplasm of breast: Secondary | ICD-10-CM

## 2015-01-28 ENCOUNTER — Other Ambulatory Visit: Payer: Self-pay

## 2015-03-15 ENCOUNTER — Ambulatory Visit
Admission: RE | Admit: 2015-03-15 | Discharge: 2015-03-15 | Disposition: A | Discharge status: Home / Self Care | Payer: Medicare Other | Source: Ambulatory Visit

## 2015-03-15 DIAGNOSIS — Z1231 Encounter for screening mammogram for malignant neoplasm of breast: Secondary | ICD-10-CM

## 2015-05-10 ENCOUNTER — Other Ambulatory Visit (HOSPITAL_COMMUNITY): Payer: Self-pay | Admitting: Internal Medicine

## 2015-05-10 DIAGNOSIS — I420 Dilated cardiomyopathy: Secondary | ICD-10-CM

## 2015-05-17 ENCOUNTER — Other Ambulatory Visit: Payer: Self-pay

## 2015-05-17 ENCOUNTER — Ambulatory Visit (HOSPITAL_COMMUNITY): Payer: Medicare Other | Attending: Internal Medicine

## 2015-05-17 DIAGNOSIS — I10 Essential (primary) hypertension: Secondary | ICD-10-CM | POA: Insufficient documentation

## 2015-05-17 DIAGNOSIS — I517 Cardiomegaly: Secondary | ICD-10-CM | POA: Insufficient documentation

## 2015-05-17 DIAGNOSIS — E669 Obesity, unspecified: Secondary | ICD-10-CM | POA: Diagnosis not present

## 2015-05-17 DIAGNOSIS — I420 Dilated cardiomyopathy: Secondary | ICD-10-CM | POA: Diagnosis not present

## 2015-05-17 DIAGNOSIS — Z6839 Body mass index (BMI) 39.0-39.9, adult: Secondary | ICD-10-CM | POA: Diagnosis not present

## 2015-05-17 DIAGNOSIS — I340 Nonrheumatic mitral (valve) insufficiency: Secondary | ICD-10-CM | POA: Diagnosis not present

## 2015-08-21 DIAGNOSIS — H524 Presbyopia: Secondary | ICD-10-CM | POA: Diagnosis not present

## 2015-08-21 DIAGNOSIS — H5213 Myopia, bilateral: Secondary | ICD-10-CM | POA: Diagnosis not present

## 2015-08-21 DIAGNOSIS — H52223 Regular astigmatism, bilateral: Secondary | ICD-10-CM | POA: Diagnosis not present

## 2015-08-21 DIAGNOSIS — Z0100 Encounter for examination of eyes and vision without abnormal findings: Secondary | ICD-10-CM | POA: Diagnosis not present

## 2015-11-28 ENCOUNTER — Observation Stay (HOSPITAL_COMMUNITY)
Admission: EM | Admit: 2015-11-28 | Discharge: 2015-11-29 | Disposition: A | Discharge status: Home / Self Care | Payer: Medicare HMO | Attending: Internal Medicine | Admitting: Internal Medicine

## 2015-11-28 ENCOUNTER — Encounter (HOSPITAL_COMMUNITY): Payer: Self-pay | Admitting: Emergency Medicine

## 2015-11-28 ENCOUNTER — Emergency Department (HOSPITAL_COMMUNITY): Payer: Medicare HMO

## 2015-11-28 DIAGNOSIS — Z8619 Personal history of other infectious and parasitic diseases: Secondary | ICD-10-CM | POA: Diagnosis not present

## 2015-11-28 DIAGNOSIS — E039 Hypothyroidism, unspecified: Secondary | ICD-10-CM

## 2015-11-28 DIAGNOSIS — I10 Essential (primary) hypertension: Secondary | ICD-10-CM | POA: Insufficient documentation

## 2015-11-28 DIAGNOSIS — K219 Gastro-esophageal reflux disease without esophagitis: Secondary | ICD-10-CM | POA: Diagnosis not present

## 2015-11-28 DIAGNOSIS — I509 Heart failure, unspecified: Secondary | ICD-10-CM | POA: Diagnosis not present

## 2015-11-28 DIAGNOSIS — G43909 Migraine, unspecified, not intractable, without status migrainosus: Secondary | ICD-10-CM | POA: Diagnosis not present

## 2015-11-28 DIAGNOSIS — J9811 Atelectasis: Secondary | ICD-10-CM | POA: Diagnosis not present

## 2015-11-28 DIAGNOSIS — Z79899 Other long term (current) drug therapy: Secondary | ICD-10-CM | POA: Insufficient documentation

## 2015-11-28 DIAGNOSIS — M1990 Unspecified osteoarthritis, unspecified site: Secondary | ICD-10-CM | POA: Insufficient documentation

## 2015-11-28 DIAGNOSIS — E079 Disorder of thyroid, unspecified: Secondary | ICD-10-CM | POA: Insufficient documentation

## 2015-11-28 DIAGNOSIS — Z87442 Personal history of urinary calculi: Secondary | ICD-10-CM | POA: Diagnosis not present

## 2015-11-28 DIAGNOSIS — Z7982 Long term (current) use of aspirin: Secondary | ICD-10-CM | POA: Insufficient documentation

## 2015-11-28 DIAGNOSIS — I2510 Atherosclerotic heart disease of native coronary artery without angina pectoris: Secondary | ICD-10-CM | POA: Diagnosis not present

## 2015-11-28 DIAGNOSIS — R55 Syncope and collapse: Secondary | ICD-10-CM | POA: Diagnosis not present

## 2015-11-28 LAB — CBC
HEMATOCRIT: 40.9 % (ref 36.0–46.0)
Hemoglobin: 13.6 g/dL (ref 12.0–15.0)
MCH: 31.1 pg (ref 26.0–34.0)
MCHC: 33.3 g/dL (ref 30.0–36.0)
MCV: 93.4 fL (ref 78.0–100.0)
Platelets: 205 10*3/uL (ref 150–400)
RBC: 4.38 MIL/uL (ref 3.87–5.11)
RDW: 12.8 % (ref 11.5–15.5)
WBC: 8.3 10*3/uL (ref 4.0–10.5)

## 2015-11-28 LAB — URINALYSIS, ROUTINE W REFLEX MICROSCOPIC
Bilirubin Urine: NEGATIVE
Glucose, UA: NEGATIVE mg/dL
Hgb urine dipstick: NEGATIVE
KETONES UR: NEGATIVE mg/dL
LEUKOCYTES UA: NEGATIVE
NITRITE: NEGATIVE
PH: 7.5 (ref 5.0–8.0)
Protein, ur: NEGATIVE mg/dL
Specific Gravity, Urine: 1.017 (ref 1.005–1.030)

## 2015-11-28 LAB — I-STAT TROPONIN, ED: Troponin i, poc: 0.01 ng/mL (ref 0.00–0.08)

## 2015-11-28 LAB — BASIC METABOLIC PANEL
ANION GAP: 13 (ref 5–15)
BUN: 24 mg/dL — AB (ref 6–20)
CHLORIDE: 104 mmol/L (ref 101–111)
CO2: 23 mmol/L (ref 22–32)
Calcium: 10 mg/dL (ref 8.9–10.3)
Creatinine, Ser: 0.93 mg/dL (ref 0.44–1.00)
GFR calc Af Amer: >60 mL/min (ref >60)
GFR calc non Af Amer: >60 mL/min (ref >60)
Glucose, Bld: 119 mg/dL — ABNORMAL HIGH (ref 65–99)
POTASSIUM: 4.8 mmol/L (ref 3.5–5.1)
SODIUM: 140 mmol/L (ref 135–145)

## 2015-11-28 MED ORDER — LOSARTAN POTASSIUM 50 MG PO TABS
100.0000 mg | ORAL_TABLET | Freq: Every morning | ORAL | Status: DC
  Administered 2015-11-29: 100 mg via ORAL
  Filled 2015-11-28: qty 2

## 2015-11-28 MED ORDER — PANTOPRAZOLE SODIUM 40 MG PO TBEC
40.0000 mg | DELAYED_RELEASE_TABLET | Freq: Every day | ORAL | Status: DC
  Administered 2015-11-29: 40 mg via ORAL
  Filled 2015-11-28: qty 1

## 2015-11-28 MED ORDER — ENOXAPARIN SODIUM 40 MG/0.4ML ~~LOC~~ SOLN
40.0000 mg | Freq: Every day | SUBCUTANEOUS | Status: DC
  Administered 2015-11-29: 40 mg via SUBCUTANEOUS
  Filled 2015-11-28: qty 0.4

## 2015-11-28 MED ORDER — SODIUM CHLORIDE 0.9% FLUSH: 3.0000 mL | Freq: Two times a day (BID) | INTRAVENOUS | Status: DC

## 2015-11-28 MED ORDER — DOCUSATE SODIUM 100 MG PO CAPS
100.0000 mg | ORAL_CAPSULE | Freq: Two times a day (BID) | ORAL | Status: DC
  Administered 2015-11-29: 100 mg via ORAL
  Filled 2015-11-28: qty 1

## 2015-11-28 MED ORDER — ASPIRIN EC 81 MG PO TBEC
81.0000 mg | DELAYED_RELEASE_TABLET | Freq: Every day | ORAL | Status: DC
  Administered 2015-11-29: 81 mg via ORAL
  Filled 2015-11-28: qty 1

## 2015-11-28 MED ORDER — ONDANSETRON HCL 4 MG/2ML IJ SOLN
4.0000 mg | Freq: Once | INTRAMUSCULAR | Status: AC
  Administered 2015-11-28: 4 mg via INTRAVENOUS
  Filled 2015-11-28: qty 2

## 2015-11-28 MED ORDER — ACETAMINOPHEN 650 MG RE SUPP: 650.0000 mg | Freq: Four times a day (QID) | RECTAL | Status: DC | PRN

## 2015-11-28 MED ORDER — FUROSEMIDE 40 MG PO TABS
40.0000 mg | ORAL_TABLET | Freq: Every day | ORAL | Status: DC
  Administered 2015-11-29: 40 mg via ORAL
  Filled 2015-11-28: qty 1

## 2015-11-28 MED ORDER — ONDANSETRON HCL 4 MG/2ML IJ SOLN: 4.0000 mg | Freq: Four times a day (QID) | INTRAMUSCULAR | Status: DC | PRN

## 2015-11-28 MED ORDER — ONDANSETRON HCL 4 MG PO TABS: 4.0000 mg | ORAL_TABLET | Freq: Four times a day (QID) | ORAL | Status: DC | PRN

## 2015-11-28 MED ORDER — SODIUM CHLORIDE 0.9 % IV SOLN
INTRAVENOUS | Status: DC
  Administered 2015-11-28: 1000 mL via INTRAVENOUS

## 2015-11-28 MED ORDER — ACETAMINOPHEN 325 MG PO TABS: 650.0000 mg | ORAL_TABLET | Freq: Four times a day (QID) | ORAL | Status: DC | PRN

## 2015-11-28 MED ORDER — CARVEDILOL 25 MG PO TABS
25.0000 mg | ORAL_TABLET | Freq: Two times a day (BID) | ORAL | Status: DC
  Administered 2015-11-29 (×2): 25 mg via ORAL
  Filled 2015-11-28 (×2): qty 1

## 2015-11-28 NOTE — ED Provider Notes (Signed)
CSN: [id number]     Arrival date & time 11/28/15  1714 History   First MD Initiated Contact with Patient 11/28/15 1745     Chief Complaint  Patient presents with  . Loss of Consciousness     (Consider location/radiation/quality/duration/timing/severity/associated sxs/prior Treatment) HPI Patient is a 65 year old female with history of CHF, hypertension, GERD, who presents following a syncopal episode. She was standing in her kitchen when she had a sudden loss of consciousness. She felt the floor. She does not remember what happened, simply remembers waking up on the floor. She denies any head, neck, chest or back pain.   She says she now feels "off" but cannot describe it. She specifically denies chest pain or shortness of breath at this time. She has not had a cough, fever, flulike symptoms. She denies blurry vision, anesthesias, dysarthrias.   Past Medical History  Diagnosis Date  . Hypertension   . CHF (congestive heart failure) (HCC)   . Thyroid disease   . Bell's palsy   . Shingles   . History of kidney stones   . GERD (gastroesophageal reflux disease)   . H/O hiatal hernia   . Headache(784.0)     migraines  . Arthritis    Past Surgical History  Procedure Laterality Date  . Abdominal hysterectomy    . Cholecystectomy    . Tubal ligation    . Dilation and curettage of uterus    . Cardiac catheterization      "years ago"  . Back surgery      lumbar/ discectomy  . Breast surgery Right     biopsy with titanium clip placement  . Parathyroidectomy Right   . Lithotripsy    . Total knee arthroplasty Left 09/25/2013    Procedure: LEFT TOTAL KNEE ARTHROPLASTY;  Surgeon: Loanne DrillingFrank V Aluisio, MD;  Location: [facility];  Service: Orthopedics;  Laterality: Left;   Family History  Problem Relation Age of Onset  . Diabetes Mother   . CAD Father    Social History  Substance Use Topics  . Smoking status: Never Smoker   . Smokeless tobacco: Never Used  . Alcohol Use: No   OB  History    No data available     Review of Systems  Constitutional: Negative for activity change and appetite change.  Eyes: Negative for photophobia and visual disturbance.  Respiratory: Negative for cough and shortness of breath.   Neurological: Positive for syncope. Negative for seizures.  All other systems reviewed and are negative.     Allergies  Codeine  Home Medications   Prior to Admission medications   Medication Sig Start Date End Date Taking? Authorizing Provider  aspirin EC 81 MG tablet Take 81 mg by mouth daily.   Yes Historical Provider, MD  carvedilol (COREG) 25 MG tablet Take 25 mg by mouth 2 (two) times daily with a meal.   Yes Historical Provider, MD  Cholecalciferol (VITAMIN D) 2000 UNITS tablet Take 2,000 Units by mouth daily.   Yes Historical Provider, MD  furosemide (LASIX) 80 MG tablet Take one tablet up to four times weekly. 03/15/14  Yes Corky CraftsJayadeep S Varanasi, MD  losartan (COZAAR) 100 MG tablet Take 100 mg by mouth every morning. 09/06/13  Yes Corky CraftsJayadeep S Varanasi, MD  naproxen sodium (ANAPROX) 220 MG tablet Take 440 mg by mouth daily as needed (arthritis).   Yes Historical Provider, MD  omeprazole (PRILOSEC) 20 MG capsule Take 20 mg by mouth daily. For heartburn   Yes Historical Provider, MD  BP 141/64 mmHg  Pulse 62  Temp(Src) 97.8 F (36.6 C) (Oral)  Resp 18  Ht  (1.626 m)  Wt 105.2 kg  BMI 39.79 kg/m2  SpO2 97% Physical Exam  Constitutional: She is oriented to person, place, and time. She appears well-developed and well-nourished. No distress.  HENT:  Head: Normocephalic.  Eyes: Pupils are equal, round, and reactive to light.  Neck: Normal range of motion.  Cardiovascular: Normal rate and regular rhythm.   No murmur heard. Pulmonary/Chest: Effort normal and breath sounds normal. No respiratory distress. She has no wheezes.  Abdominal: Soft. Bowel sounds are normal. She exhibits no distension and no mass. There is no guarding.   Musculoskeletal: Normal range of motion. She exhibits no tenderness.  Neurological: She is alert and oriented to person, place, and time. No cranial nerve deficit. Coordination normal.  Skin: Skin is warm and dry.    ED Course  Procedures (including critical care time) Labs Review Labs Reviewed  BASIC METABOLIC PANEL - Abnormal; Notable for the following:    Glucose, Bld 119 (*)    BUN 24 (*)    All other components within normal limits  URINALYSIS, ROUTINE W REFLEX MICROSCOPIC (NOT AT [facility]) - Abnormal; Notable for the following:    APPearance CLOUDY (*)    All other components within normal limits  BASIC METABOLIC PANEL - Abnormal; Notable for the following:    Glucose, Bld 107 (*)    BUN 21 (*)    All other components within normal limits  CBC - Abnormal; Notable for the following:    RBC 3.73 (*)    Hemoglobin 11.5 (*)    HCT 34.8 (*)    All other components within normal limits  CBC  CBC  CREATININE, SERUM  TROPONIN I  TROPONIN I  TROPONIN I  I-STAT TROPOININ, ED    Imaging Review Dg Chest 2 View  11/28/2015  CLINICAL DATA:  Acute onset of syncope.  Initial encounter. EXAM: CHEST  2 VIEW COMPARISON:  Chest radiograph performed 12/01/2014 FINDINGS: There is chronic elevation of the right hemidiaphragm, with mild right basilar atelectasis. No pleural effusion or pneumothorax is seen. The cardiomediastinal silhouette remains normal in size. No acute osseous abnormalities are identified. IMPRESSION: Chronic elevation of the right hemidiaphragm, with mild right basilar atelectasis. Electronically Signed   By: Roanna Raider M.D.   On: 11/28/2015 19:31   I have personally reviewed and evaluated these images and lab results as part of my medical decision-making.   EKG Interpretation   Date/Time:  Thursday November 28 2015 17:34:39 EDT Ventricular Rate:  61 PR Interval:  208 QRS Duration: 138 QT Interval:  454 QTC Calculation: 457 R Axis:   -43 Text Interpretation:   Normal sinus rhythm Left axis deviation Right bundle  branch block Left ventricular hypertrophy with QRS widening Nonspecific T  wave abnormality Confirmed by Denton Lank  MD, Caryn Bee ([id number]) on 11/28/2015  6:29:20 PM      MDM   Final diagnoses:  Syncope, unspecified syncope type    Patient with history of heart failure, coronary artery disease presents with syncopal episode. This was unprovoked, did not have a prodrome, she is not clinically dehydrated, and has no infectious symptoms. She is back at baseline, but does not still feel normal. Vital signs all at her baseline, she has chronic hypertension and is hypertensive on arrival, but that has resolved after observation in the ED without intervention. She has no signs of trauma, does not complain of  any head neck back or extremity pain warranting advanced imaging.  EKG shows nonspecific T-wave changes, with T-wave inversions in the inferior leads that appear new since previous EKG. Troponin negative, not actively expressing any chest pain, and a story not consistent with acute coronary syndrome. Considering her history of CHF, EKG changes, and clinical history, believe she is at high risk for arrhythmia, and we'll admit for observation to the hospitalist.  Erskine Emery, MD 11/30/15 1200  Cathren Laine, MD 12/02/15 [phone]

## 2015-11-28 NOTE — ED Notes (Signed)
Pt reports that she had a syncopal episode while standing in her kitchen. Pt reports she turned around and the next thing she knew she was waking up on the floor. Pt does not know if she shift her head. Pt reports nausea post syncope. Pt alert x4. NAD at this time.

## 2015-11-28 NOTE — H&P (Signed)
PCP:   Pearla DubonnetGATES,ROBERT NEVILL, MD   Chief Complaint:  Loss of consciousness  HPI: [name] who was at home preparing lunch for tomorrow. She felt funny, then had a brief unwitnessed episode of loss of consciousness. She is unclear how long she was out for but believes it was for just a few minutes. She had no prodromal symptoms. She was not postictal upon awakening. She denies any prior chest pain or palpitations. She denies any incontinence, burning on urination. She denies any new medication or change in medication. She denies tinnitus, hearing loss or dizziness. She denies headache or pain. She states this is the third time in 3 years that this is occurred but this is the second time this year.  Review of Systems:  The patient denies anorexia, fever, weight loss,, vision loss, decreased hearing, hoarseness, chest pain, syncope, dyspnea on exertion, peripheral edema, balance deficits, hemoptysis, abdominal pain, melena, hematochezia, severe indigestion/heartburn, hematuria, incontinence, genital sores, muscle weakness, suspicious skin lesions, transient blindness, difficulty walking, depression, unusual weight change, abnormal bleeding, enlarged lymph nodes, angioedema, and breast masses.  Past Medical History: Past Medical History  Diagnosis Date  . Hypertension   . CHF (congestive heart failure) (HCC)   . Thyroid disease   . Bell's palsy   . Shingles   . History of kidney stones   . GERD (gastroesophageal reflux disease)   . H/O hiatal hernia   . Headache(784.0)     migraines  . Arthritis    Past Surgical History  Procedure Laterality Date  . Abdominal hysterectomy    . Cholecystectomy    . Tubal ligation    . Dilation and curettage of uterus    . Cardiac catheterization      "years ago"  . Back surgery      lumbar/ discectomy  . Breast surgery Right     biopsy with titanium clip placement  . Parathyroidectomy Right   . Lithotripsy    . Total knee  arthroplasty Left 09/25/2013    Procedure: LEFT TOTAL KNEE ARTHROPLASTY;  Surgeon: Loanne DrillingFrank V Aluisio, MD;  Location: [facility];  Service: Orthopedics;  Laterality: Left;    Medications: Prior to Admission medications   Medication Sig Start Date End Date Taking? Authorizing Provider  aspirin EC 81 MG tablet Take 81 mg by mouth daily.   Yes Historical Provider, MD  carvedilol (COREG) 25 MG tablet Take 25 mg by mouth 2 (two) times daily with a meal.   Yes Historical Provider, MD  Cholecalciferol (VITAMIN D) 2000 UNITS tablet Take 2,000 Units by mouth daily.   Yes Historical Provider, MD  furosemide (LASIX) 80 MG tablet Take one tablet up to four times weekly. 03/15/14  Yes Corky CraftsJayadeep S Varanasi, MD  losartan (COZAAR) 100 MG tablet Take 100 mg by mouth every morning. 09/06/13  Yes Corky CraftsJayadeep S Varanasi, MD  naproxen sodium (ANAPROX) 220 MG tablet Take 440 mg by mouth daily as needed (arthritis).   Yes Historical Provider, MD  omeprazole (PRILOSEC) 20 MG capsule Take 20 mg by mouth daily. For heartburn   Yes Historical Provider, MD    Allergies:   Allergies  Allergen Reactions  . Codeine Nausea And Vomiting    Social History:  reports that she has never smoked. She has never used smokeless tobacco. She reports that she does not drink alcohol or use illicit drugs.  Family History: Family History  Problem Relation Age of Onset  . Diabetes Mother   . CAD Father  Physical Exam: Filed Vitals:   11/28/15 1725 11/28/15 1845 11/28/15 1948 11/28/15 1952  BP: 187/87 174/86 139/96   Pulse: 66 58  55  Temp: 97.3 F (36.3 C)     TempSrc: Oral     Resp: SpO2: 97% 98%  97%    General:  Alert and oriented times three, well developed and nourished, no acute distress Eyes: PERRLA, pink conjunctiva, no scleral icterus ENT: Moist oral mucosa, neck supple, no thyromegaly Lungs: clear to ascultation, no wheeze, no crackles, no use of accessory muscles Cardiovascular: regular rate and rhythm,  no regurgitation, no gallops, no murmurs. No carotid bruits, no JVD Abdomen: soft, positive BS, non-tender, non-distended, no organomegaly, not an acute abdomen GU: not examined Neuro: CN II - XII grossly intact, sensation intact Musculoskeletal: strength 5/5 all extremities, no clubbing, cyanosis or edema Skin: no rash, no subcutaneous crepitation, no decubitus Psych: appropriate patient   Labs on Admission:   Recent Labs  11/28/15 1638  NA 140  K 4.8  CL 104  CO2 23  GLUCOSE 119*  BUN 24*  CREATININE 0.93  CALCIUM 10.0   No results for input(s): AST, ALT, ALKPHOS, BILITOT, PROT, ALBUMIN in the last 72 hours. No results for input(s): LIPASE, AMYLASE in the last 72 hours.  Recent Labs  11/28/15 1638  WBC 8.3  HGB 13.6  HCT 40.9  MCV 93.4  PLT 205   No results for input(s): CKTOTAL, CKMB, CKMBINDEX, TROPONINI in the last 72 hours. Invalid input(s): POCBNP No results for input(s): DDIMER in the last 72 hours. No results for input(s): HGBA1C in the last 72 hours. No results for input(s): CHOL, HDL, LDLCALC, TRIG, CHOLHDL, LDLDIRECT in the last 72 hours. No results for input(s): TSH, T4TOTAL, T3FREE, THYROIDAB in the last 72 hours.  Invalid input(s): FREET3 No results for input(s): VITAMINB12, FOLATE, FERRITIN, TIBC, IRON, RETICCTPCT in the last 72 hours.  Micro Results: No results found for this or any previous visit (from the past 240 hour(s)).   Radiological Exams on Admission: Dg Chest 2 View  11/28/2015  CLINICAL DATA:  Acute onset of syncope.  Initial encounter. EXAM: CHEST  2 VIEW COMPARISON:  Chest radiograph performed 12/01/2014 FINDINGS: There is chronic elevation of the right hemidiaphragm, with mild right basilar atelectasis. No pleural effusion or pneumothorax is seen. The cardiomediastinal silhouette remains normal in size. No acute osseous abnormalities are identified. IMPRESSION: Chronic elevation of the right hemidiaphragm, with mild right basilar  atelectasis. Electronically Signed   By: Roanna Raider M.D.   On: 11/28/2015 19:31   EKG: Normal sinus rhythm (when compared to EKG done in 2016, patient has flattening of the ST segments in the lateral leads)   Assessment/Plan Present on Admission:  . Syncope and collapse -Patient with EKG changes when compared to EKG done in 2016, patient has flattening of the ST segments in the lateral leads. But patient without any chest pains. -Will cycle cardiac enzymes and monitor in telemetry -Will order 2-D echo and a stress test for the a.m. -Aspirin daily -Consult cardiology in a.m.   H/o CHF diastolic -Stable, resume home medication  . Essential hypertension, benign -Stable, home medications resumed  . GERD (gastroesophageal reflux disease) -Stable, home medications resumed   Kelly Castaneda 11/28/2015, 8:24 PM

## 2015-11-28 NOTE — ED Notes (Signed)
Attempted report.  Nurse to call back when available. 

## 2015-11-29 ENCOUNTER — Observation Stay (HOSPITAL_BASED_OUTPATIENT_CLINIC_OR_DEPARTMENT_OTHER): Payer: Medicare HMO

## 2015-11-29 ENCOUNTER — Other Ambulatory Visit: Payer: Self-pay | Admitting: Student

## 2015-11-29 DIAGNOSIS — K219 Gastro-esophageal reflux disease without esophagitis: Secondary | ICD-10-CM | POA: Diagnosis not present

## 2015-11-29 DIAGNOSIS — R55 Syncope and collapse: Secondary | ICD-10-CM

## 2015-11-29 DIAGNOSIS — I10 Essential (primary) hypertension: Secondary | ICD-10-CM

## 2015-11-29 DIAGNOSIS — I5032 Chronic diastolic (congestive) heart failure: Secondary | ICD-10-CM

## 2015-11-29 LAB — CBC
HEMATOCRIT: 34.8 % — AB (ref 36.0–46.0)
HEMATOCRIT: 36.5 % (ref 36.0–46.0)
Hemoglobin: 11.5 g/dL — ABNORMAL LOW (ref 12.0–15.0)
Hemoglobin: 12.1 g/dL (ref 12.0–15.0)
MCH: 30.8 pg (ref 26.0–34.0)
MCH: 31.3 pg (ref 26.0–34.0)
MCHC: 33 g/dL (ref 30.0–36.0)
MCHC: 33.2 g/dL (ref 30.0–36.0)
MCV: 93.3 fL (ref 78.0–100.0)
MCV: 94.3 fL (ref 78.0–100.0)
PLATELETS: 189 10*3/uL (ref 150–400)
Platelets: 187 10*3/uL (ref 150–400)
RBC: 3.73 MIL/uL — ABNORMAL LOW (ref 3.87–5.11)
RBC: 3.87 MIL/uL (ref 3.87–5.11)
RDW: 13 % (ref 11.5–15.5)
RDW: 13 % (ref 11.5–15.5)
WBC: 6.6 10*3/uL (ref 4.0–10.5)
WBC: 6.8 10*3/uL (ref 4.0–10.5)

## 2015-11-29 LAB — BASIC METABOLIC PANEL
ANION GAP: 12 (ref 5–15)
BUN: 21 mg/dL — ABNORMAL HIGH (ref 6–20)
CHLORIDE: 107 mmol/L (ref 101–111)
CO2: 22 mmol/L (ref 22–32)
Calcium: 9.6 mg/dL (ref 8.9–10.3)
Creatinine, Ser: 0.8 mg/dL (ref 0.44–1.00)
Glucose, Bld: 107 mg/dL — ABNORMAL HIGH (ref 65–99)
POTASSIUM: 3.8 mmol/L (ref 3.5–5.1)
SODIUM: 141 mmol/L (ref 135–145)

## 2015-11-29 LAB — CREATININE, SERUM
Creatinine, Ser: 0.84 mg/dL (ref 0.44–1.00)
GFR calc non Af Amer: >60 mL/min (ref >60)

## 2015-11-29 LAB — TROPONIN I

## 2015-11-29 LAB — ECHOCARDIOGRAM COMPLETE
Height: 64 in
Weight: 3710.78 oz

## 2015-11-29 NOTE — Discharge Instructions (Signed)
Follow with Primary MD GATES,ROBERT NEVILL, MD in 7 days   Get CBC, CMP,  checked  by Primary MD next visit.   Do not drive, operating heavy machinery, perform activities at heights, swimming or participation in water activities or provide baby sitting services if your were admitted for syncope or siezures until you have seen by Primary MD or a Neurologist and advised to do so again. Activity: As tolerated with Full fall precautions use walker/cane & assistance as needed   Disposition Home   Diet: Heart Healthy  , with feeding assistance and aspiration precautions.  For Heart failure patients - Check your Weight same time everyday, if you gain over 2 pounds, or you develop in leg swelling, experience more shortness of breath or chest pain, call your Primary MD immediately. Follow Cardiac Low Salt Diet and 1.5 lit/day fluid restriction.   On your next visit with your primary care physician please Get Medicines reviewed and adjusted.   Please request your Prim.MD to go over all Hospital Tests and Procedure/Radiological results at the follow up, please get all Hospital records sent to your Prim MD by signing hospital release before you go home.   If you experience worsening of your admission symptoms, develop shortness of breath, life threatening emergency, suicidal or homicidal thoughts you must seek medical attention immediately by calling 911 or calling your MD immediately  if symptoms less severe.  You Must read complete instructions/literature along with all the possible adverse reactions/side effects for all the Medicines you take and that have been prescribed to you. Take any new Medicines after you have completely understood and accpet all the possible adverse reactions/side effects.   Do not drive, operating heavy machinery, perform activities at heights, swimming or participation in water activities or provide baby sitting services if your were admitted for syncope or siezures until  you have seen by Primary MD or a Neurologist and advised to do so again.  Do not drive when taking Pain medications.    Do not take more than prescribed Pain, Sleep and Anxiety Medications  Special Instructions: If you have smoked or chewed Tobacco  in the last 2 yrs please stop smoking, stop any regular Alcohol  and or any Recreational drug use.  Wear Seat belts while driving.   Please note  You were cared for by a hospitalist during your hospital stay. If you have any questions about your discharge medications or the care you received while you were in the hospital after you are discharged, you can call the unit and asked to speak with the hospitalist on call if the hospitalist that took care of you is not available. Once you are discharged, your primary care physician will handle any further medical issues. Please note that NO REFILLS for any discharge medications will be authorized once you are discharged, as it is imperative that you return to your primary care physician (or establish a relationship with a primary care physician if you do not have one) for your aftercare needs so that they can reassess your need for medications and monitor your lab values.

## 2015-11-29 NOTE — Progress Notes (Signed)
  Echocardiogram 2D Echocardiogram has been performed.  Kelly Castaneda, Kelly Castaneda 11/29/2015, 3:40 PM

## 2015-11-29 NOTE — Care Management Obs Status (Signed)
MEDICARE OBSERVATION STATUS NOTIFICATION   Patient Details  Name: Kelly SkeensLoretta H Ziemann MRN: [id number] Date of Birth: 08-14-1950   Medicare Observation Status Notification Given:  Yes    Jazzie Trampe, Annamarie MajorCheryl U, RN 11/29/2015, 3:12 PM

## 2015-11-29 NOTE — Discharge Summary (Signed)
Kelly Castaneda, is a 65 y.o. female  DOB Oct 13, 1950  MRN [id number].  Admission date:  11/28/2015  Admitting Physician  Gery Prayebby Crosley, MD  Discharge Date:  11/29/2015   Primary MD  Pearla DubonnetGATES,ROBERT NEVILL, MD  Recommendations for primary care physician for things to follow:  - Check CBC, BMP during next visit - Patient will have 30 days event monitor results by cardiology   Admission Diagnosis  Syncope, unspecified syncope type [R55]   Discharge Diagnosis  Syncope, unspecified syncope type [R55]    Principal Problem:   Syncope and collapse Active Problems:   Congestive heart failure (HCC)   Essential hypertension, benign   GERD (gastroesophageal reflux disease)   Hypothyroidism      Past Medical History  Diagnosis Date  . Hypertension   . CHF (congestive heart failure) (HCC)   . Thyroid disease   . Bell's palsy   . Shingles   . History of kidney stones   . GERD (gastroesophageal reflux disease)   . H/O hiatal hernia   . Headache(784.0)     migraines  . Arthritis     Past Surgical History  Procedure Laterality Date  . Abdominal hysterectomy    . Cholecystectomy    . Tubal ligation    . Dilation and curettage of uterus    . Cardiac catheterization      "years ago"  . Back surgery      lumbar/ discectomy  . Breast surgery Right     biopsy with titanium clip placement  . Parathyroidectomy Right   . Lithotripsy    . Total knee arthroplasty Left 09/25/2013    Procedure: LEFT TOTAL KNEE ARTHROPLASTY;  Surgeon: Loanne DrillingFrank V Aluisio, MD;  Location: [facility];  Service: Orthopedics;  Laterality: Left;       History of present illness and  Hospital Course:     Kindly see H&P for history of present illness and admission details, please review complete Labs, Consult reports and Test reports for all details in brief  HPI  from the history and physical done on the day of admission  11/28/2015 [name] who was at home preparing lunch for tomorrow. She felt funny, then had a brief unwitnessed episode of loss of consciousness. She is unclear how long she was out for but believes it was for just a few minutes. She had no prodromal symptoms. She was not postictal upon awakening. She denies any prior chest pain or palpitations. She denies any incontinence, burning on urination. She denies any new medication or change in medication. She denies tinnitus, hearing loss or dizziness. She denies headache or pain. She states this is the third time in 3 years that this is occurred but this is the second time this year.   Hospital Course  Syncope and collapse - Patient reports this is his third episode of the last 3 years, admitted to telemetry, no significant events on telemetry beside occasional sinus arrhythmias, no pauses or block, 2-D echo was obtained, EF 50-55%, no  regional wall motion abnormalities, she reports symptoms of sudden onset, for for which she will need to be further evaluated, contacted cardiology service to arrange 30 days event monitor as an outpatient, will EKG with flattening of ST segment and lateral lead, patient adamant about leaving today, did not want to have stress test done during hospital stay, was instructed to follow with her cardiologist for workup as an outpatient.  H/o CHF diastolic -Stable, resume home medication  . Essential hypertension, benign -Stable, home medications resumed  . GERD (gastroesophageal reflux disease) -Stable, home medications resumed   Discharge Condition:  stable   Follow UP  Follow-up Information    Follow up with [facility].   Specialty:  Cardiology   Why:  The office will call you to arrange a time to pick up your cardiac event monitor.   Contact information:   [address], [address] [place] [address] [phone]      Follow up with Pearla Dubonnet, MD. Schedule an appointment as soon as possible for a visit in 1 week.   Specialty:  Internal Medicine   Contact information:   [address] [place] [address] [phone]       Follow up with [name]., MD. Schedule an appointment as soon as possible for a visit in 2 weeks.   Specialties:  Cardiology, Radiology, Interventional Cardiology   Why:  Abnormal EKG   Contact information:   [address] [place] [address] [phone]         Discharge Instructions  and  Discharge Medications         Discharge Instructions    Discharge instructions    Complete by:  As directed   Follow with Primary MD GATES,ROBERT NEVILL, MD in 7 days   Get CBC, CMP,  checked  by Primary MD next visit.   Do not drive, operating heavy machinery, perform activities at heights, swimming or participation in water activities or provide baby sitting services if your were admitted for syncope or siezures until you have seen by Primary MD or a Neurologist and advised to do so again. Activity: As tolerated with Full fall precautions use walker/cane & assistance as needed   Disposition Home   Diet: Heart Healthy  , with feeding assistance and aspiration precautions.  For Heart failure patients - Check your Weight same time everyday, if you gain over 2 pounds, or you develop in leg swelling, experience more shortness of breath or chest pain, call your Primary MD immediately. Follow Cardiac Low Salt Diet and 1.5 lit/day fluid restriction.   On your next visit with your primary care physician please Get Medicines reviewed and adjusted.   Please request your Prim.MD to go over all Hospital Tests and Procedure/Radiological results at the follow up, please get all Hospital records sent to your Prim MD by signing hospital release before you go home.   If you experience worsening of your admission symptoms, develop shortness of breath, life  threatening emergency, suicidal or homicidal thoughts you must seek medical attention immediately by calling 911 or calling your MD immediately  if symptoms less severe.  You Must read complete instructions/literature along with all the possible adverse reactions/side effects for all the Medicines you take and that have been prescribed to you. Take any new Medicines after you have completely understood and accpet all the possible adverse reactions/side effects.   Do not drive, operating heavy machinery, perform  activities at heights, swimming or participation in water activities or provide baby sitting services if your were admitted for syncope or siezures until you have seen by Primary MD or a Neurologist and advised to do so again.  Do not drive when taking Pain medications.    Do not take more than prescribed Pain, Sleep and Anxiety Medications  Special Instructions: If you have smoked or chewed Tobacco  in the last 2 yrs please stop smoking, stop any regular Alcohol  and or any Recreational drug use.  Wear Seat belts while driving.   Please note  You were cared for by a hospitalist during your hospital stay. If you have any questions about your discharge medications or the care you received while you were in the hospital after you are discharged, you can call the unit and asked to speak with the hospitalist on call if the hospitalist that took care of you is not available. Once you are discharged, your primary care physician will handle any further medical issues. Please note that NO REFILLS for any discharge medications will be authorized once you are discharged, as it is imperative that you return to your primary care physician (or establish a relationship with a primary care physician if you do not have one) for your aftercare needs so that they can reassess your need for medications and monitor your lab values.     Increase activity slowly    Complete by:  As directed              Medication List    TAKE these medications        aspirin EC 81 MG tablet  Take 81 mg by mouth daily.     carvedilol 25 MG tablet  Commonly known as:  COREG  Take 25 mg by mouth 2 (two) times daily with a meal.     furosemide 80 MG tablet  Commonly known as:  LASIX  Take one tablet up to four times weekly.     losartan 100 MG tablet  Commonly known as:  COZAAR  Take 100 mg by mouth every morning.     naproxen sodium 220 MG tablet  Commonly known as:  ANAPROX  Take 440 mg by mouth daily as needed (arthritis).     omeprazole 20 MG capsule  Commonly known as:  PRILOSEC  Take 20 mg by mouth daily. For heartburn     Vitamin D 2000 units tablet  Take 2,000 Units by mouth daily.          Diet and Activity recommendation: See Discharge Instructions above   Consults obtained -  none   Major procedures and Radiology Reports - PLEASE review detailed and final reports for all details, in brief -      Dg Chest 2 View  11/28/2015  CLINICAL DATA:  Acute onset of syncope.  Initial encounter. EXAM: CHEST  2 VIEW COMPARISON:  Chest radiograph performed 12/01/2014 FINDINGS: There is chronic elevation of the right hemidiaphragm, with mild right basilar atelectasis. No pleural effusion or pneumothorax is seen. The cardiomediastinal silhouette remains normal in size. No acute osseous abnormalities are identified. IMPRESSION: Chronic elevation of the right hemidiaphragm, with mild right basilar atelectasis. Electronically Signed   By: Roanna Raider M.D.   On: 11/28/2015 19:31    Micro Results     No results found for this or any previous visit (from the past 240 hour(s)).     Today   Subjective:   Kelly Castaneda today  has no headache,no chest or abdominal pain,no new weakness tingling or numbness, feels much better wants to go home today.   Objective:   Blood pressure 141/64, pulse 62, temperature 97.8 F (36.6 C), temperature source Oral, resp. rate 18, height   (1.626 m), weight 105.2 kg (231 lb 14.8 oz), SpO2 97 %.   Intake/Output Summary (Last 24 hours) at 11/29/15 1733 Last data filed at 11/29/15 1444  Gross per 24 hour  Intake  482.5 ml  Output   1650 ml  Net -1167.5 ml    Exam Awake Alert, Oriented x 3, No new F.N deficits, Normal affect [place].AT,PERRAL Supple Neck,No JVD, No cervical lymphadenopathy appriciated.  Symmetrical Chest wall movement, Good air movement bilaterally, CTAB RRR,No Gallops,Rubs or new Murmurs, No Parasternal Heave +ve B.Sounds, Abd Soft, Non tender, No organomegaly appriciated, No rebound -guarding or rigidity. No Cyanosis, Clubbing or edema, No new Rash or bruise  Data Review   CBC w Diff:  Lab Results  Component Value Date   WBC 6.8 11/29/2015   HGB 11.5* 11/29/2015   HCT 34.8* 11/29/2015   PLT 187 11/29/2015   LYMPHOPCT 11* 12/01/2014   MONOPCT 3 12/01/2014   EOSPCT 0 12/01/2014   BASOPCT 0 12/01/2014    CMP:  Lab Results  Component Value Date   NA 141 11/29/2015   K 3.8 11/29/2015   CL 107 11/29/2015   CO2 22 11/29/2015   BUN 21* 11/29/2015   CREATININE 0.80 11/29/2015   PROT 7.8 12/01/2014   ALBUMIN 4.1 12/01/2014   BILITOT 0.6 12/01/2014   ALKPHOS 59 12/01/2014   AST 32 12/01/2014   ALT 30 12/01/2014  .   Total Time in preparing paper work, data evaluation and todays exam - 35 minutes  Manford Sprong M.D on 11/29/2015 at 5:33 PM  [facility]   Office  [phone]

## 2015-11-29 NOTE — Progress Notes (Deleted)
  Echocardiogram Echocardiogram Transesophageal has been performed.  Kelly Castaneda, Kelly Castaneda 11/29/2015, 3:39 PM

## 2015-12-04 DIAGNOSIS — I420 Dilated cardiomyopathy: Secondary | ICD-10-CM | POA: Diagnosis not present

## 2015-12-04 DIAGNOSIS — R55 Syncope and collapse: Secondary | ICD-10-CM | POA: Diagnosis not present

## 2015-12-04 DIAGNOSIS — K219 Gastro-esophageal reflux disease without esophagitis: Secondary | ICD-10-CM | POA: Diagnosis not present

## 2015-12-06 ENCOUNTER — Ambulatory Visit (INDEPENDENT_AMBULATORY_CARE_PROVIDER_SITE_OTHER): Payer: Medicare HMO

## 2015-12-06 DIAGNOSIS — R55 Syncope and collapse: Secondary | ICD-10-CM

## 2015-12-10 ENCOUNTER — Encounter: Payer: Medicare HMO | Admitting: Physician Assistant

## 2015-12-10 NOTE — Progress Notes (Signed)
    Cardiology Office Note   Date:  12/10/2015   ID:  Kelly Castaneda, DOB 1950/08/25, MRN [id number]  PCP:  Pearla DubonnetGATES,ROBERT NEVILL, MD  Cardiologist:  ?Dr Jeanette CapriceVaranasi  [place] Kintz, PA-C   No chief complaint on file.   History of Present Illness: Kelly Castaneda is a 65 y.o. female with a history of HTN, S-CHF w/ ef 25-30% but no sig CAD at cath 2010.  Recent admit for syncope, 30-day event monitor placed. EF was 50-55% by echo  Pt still wearing event monitor, visit canceled, f/u after monitor completed. This encounter was created in error - please disregard.

## 2016-01-01 DIAGNOSIS — G4733 Obstructive sleep apnea (adult) (pediatric): Secondary | ICD-10-CM | POA: Diagnosis not present

## 2016-01-03 ENCOUNTER — Encounter (HOSPITAL_COMMUNITY)
Admission: EM | Disposition: A | Discharge status: Home / Self Care | Payer: Self-pay | Source: Home / Self Care | Attending: Cardiovascular Disease

## 2016-01-03 ENCOUNTER — Inpatient Hospital Stay (HOSPITAL_COMMUNITY)
Admission: EM | Admit: 2016-01-03 | Discharge: 2016-01-05 | DRG: 310 | Disposition: A | Discharge status: Home / Self Care | Payer: Medicare HMO | Attending: Cardiovascular Disease | Admitting: Cardiovascular Disease

## 2016-01-03 ENCOUNTER — Ambulatory Visit (HOSPITAL_COMMUNITY): Admit: 2016-01-03 | Payer: Self-pay | Admitting: Cardiovascular Disease

## 2016-01-03 ENCOUNTER — Encounter (HOSPITAL_COMMUNITY): Payer: Self-pay

## 2016-01-03 ENCOUNTER — Emergency Department (HOSPITAL_COMMUNITY): Payer: Medicare HMO

## 2016-01-03 DIAGNOSIS — Z981 Arthrodesis status: Secondary | ICD-10-CM | POA: Diagnosis not present

## 2016-01-03 DIAGNOSIS — Z96652 Presence of left artificial knee joint: Secondary | ICD-10-CM | POA: Diagnosis not present

## 2016-01-03 DIAGNOSIS — Z6837 Body mass index (BMI) 37.0-37.9, adult: Secondary | ICD-10-CM | POA: Diagnosis not present

## 2016-01-03 DIAGNOSIS — Z885 Allergy status to narcotic agent status: Secondary | ICD-10-CM

## 2016-01-03 DIAGNOSIS — I442 Atrioventricular block, complete: Secondary | ICD-10-CM | POA: Diagnosis not present

## 2016-01-03 DIAGNOSIS — I462 Cardiac arrest due to underlying cardiac condition: Secondary | ICD-10-CM | POA: Diagnosis not present

## 2016-01-03 DIAGNOSIS — I459 Conduction disorder, unspecified: Secondary | ICD-10-CM

## 2016-01-03 DIAGNOSIS — Z79899 Other long term (current) drug therapy: Secondary | ICD-10-CM | POA: Diagnosis not present

## 2016-01-03 DIAGNOSIS — E039 Hypothyroidism, unspecified: Secondary | ICD-10-CM | POA: Diagnosis present

## 2016-01-03 DIAGNOSIS — R404 Transient alteration of awareness: Secondary | ICD-10-CM | POA: Diagnosis not present

## 2016-01-03 DIAGNOSIS — E669 Obesity, unspecified: Secondary | ICD-10-CM | POA: Diagnosis present

## 2016-01-03 DIAGNOSIS — K219 Gastro-esophageal reflux disease without esophagitis: Secondary | ICD-10-CM | POA: Diagnosis not present

## 2016-01-03 DIAGNOSIS — R531 Weakness: Secondary | ICD-10-CM | POA: Diagnosis not present

## 2016-01-03 DIAGNOSIS — R55 Syncope and collapse: Secondary | ICD-10-CM | POA: Diagnosis present

## 2016-01-03 DIAGNOSIS — I10 Essential (primary) hypertension: Secondary | ICD-10-CM | POA: Diagnosis not present

## 2016-01-03 DIAGNOSIS — Z7982 Long term (current) use of aspirin: Secondary | ICD-10-CM | POA: Diagnosis not present

## 2016-01-03 DIAGNOSIS — Z8249 Family history of ischemic heart disease and other diseases of the circulatory system: Secondary | ICD-10-CM | POA: Diagnosis not present

## 2016-01-03 HISTORY — DX: Essential (primary) hypertension: I10

## 2016-01-03 HISTORY — DX: Chronic diastolic (congestive) heart failure: I50.32

## 2016-01-03 HISTORY — DX: Syncope and collapse: R55

## 2016-01-03 HISTORY — PX: CARDIAC CATHETERIZATION: SHX172

## 2016-01-03 HISTORY — DX: Hypothyroidism, unspecified: E03.9

## 2016-01-03 HISTORY — DX: Atrioventricular block, complete: I44.2

## 2016-01-03 LAB — COMPREHENSIVE METABOLIC PANEL
ALBUMIN: 3.6 g/dL (ref 3.5–5.0)
ALK PHOS: 55 U/L (ref 38–126)
ALT: 21 U/L (ref 14–54)
AST: 25 U/L (ref 15–41)
Anion gap: 6 (ref 5–15)
BILIRUBIN TOTAL: 0.3 mg/dL (ref 0.3–1.2)
BUN: 21 mg/dL — AB (ref 6–20)
CALCIUM: 9.3 mg/dL (ref 8.9–10.3)
CO2: 27 mmol/L (ref 22–32)
CREATININE: 0.78 mg/dL (ref 0.44–1.00)
Chloride: 107 mmol/L (ref 101–111)
GFR calc Af Amer: >60 mL/min (ref >60)
GLUCOSE: 143 mg/dL — AB (ref 65–99)
POTASSIUM: 3.9 mmol/L (ref 3.5–5.1)
Sodium: 140 mmol/L (ref 135–145)
TOTAL PROTEIN: 7.3 g/dL (ref 6.5–8.1)

## 2016-01-03 LAB — CBC WITH DIFFERENTIAL/PLATELET
BASOS ABS: 0 10*3/uL (ref 0.0–0.1)
BASOS PCT: 0 %
Basophils Absolute: 0 10*3/uL (ref 0.0–0.1)
Basophils Relative: 0 %
EOS ABS: 0.1 10*3/uL (ref 0.0–0.7)
EOS ABS: 0 10*3/uL (ref 0.0–0.7)
EOS PCT: 1 %
Eosinophils Relative: 0 %
HCT: 36 % (ref 36.0–46.0)
HCT: 37.3 % (ref 36.0–46.0)
HEMOGLOBIN: 11.6 g/dL — AB (ref 12.0–15.0)
HEMOGLOBIN: 11.9 g/dL — AB (ref 12.0–15.0)
LYMPHS ABS: 2 10*3/uL (ref 0.7–4.0)
LYMPHS ABS: 0.7 10*3/uL (ref 0.7–4.0)
Lymphocytes Relative: 23 %
Lymphocytes Relative: 10 %
MCH: 30.1 pg (ref 26.0–34.0)
MCH: 30.4 pg (ref 26.0–34.0)
MCHC: 32.2 g/dL (ref 30.0–36.0)
MCHC: 31.9 g/dL (ref 30.0–36.0)
MCV: 93.3 fL (ref 78.0–100.0)
MCV: 95.2 fL (ref 78.0–100.0)
MONO ABS: 0.4 10*3/uL (ref 0.1–1.0)
MONO ABS: 0.2 10*3/uL (ref 0.1–1.0)
MONOS PCT: 5 %
MONOS PCT: 3 %
NEUTROS PCT: 87 %
Neutro Abs: 6 10*3/uL (ref 1.7–7.7)
Neutro Abs: 6 10*3/uL (ref 1.7–7.7)
Neutrophils Relative %: 71 %
Platelets: 197 10*3/uL (ref 150–400)
Platelets: 191 10*3/uL (ref 150–400)
RBC: 3.86 MIL/uL — AB (ref 3.87–5.11)
RBC: 3.92 MIL/uL (ref 3.87–5.11)
RDW: 13.3 % (ref 11.5–15.5)
RDW: 13.3 % (ref 11.5–15.5)
WBC: 8.6 10*3/uL (ref 4.0–10.5)
WBC: 6.9 10*3/uL (ref 4.0–10.5)

## 2016-01-03 LAB — I-STAT CHEM 8, ED
BUN: 24 mg/dL — ABNORMAL HIGH (ref 6–20)
CREATININE: 0.7 mg/dL (ref 0.44–1.00)
Calcium, Ion: 1.21 mmol/L (ref 1.13–1.30)
Chloride: 106 mmol/L (ref 101–111)
Glucose, Bld: 159 mg/dL — ABNORMAL HIGH (ref 65–99)
HEMATOCRIT: 37 % (ref 36.0–46.0)
HEMOGLOBIN: 12.6 g/dL (ref 12.0–15.0)
POTASSIUM: 3.8 mmol/L (ref 3.5–5.1)
SODIUM: 142 mmol/L (ref 135–145)
TCO2: 23 mmol/L (ref 0–100)

## 2016-01-03 LAB — MRSA PCR SCREENING: MRSA BY PCR: NEGATIVE

## 2016-01-03 LAB — BASIC METABOLIC PANEL
ANION GAP: 9 (ref 5–15)
BUN: 23 mg/dL — AB (ref 6–20)
CHLORIDE: 108 mmol/L (ref 101–111)
CO2: 22 mmol/L (ref 22–32)
Calcium: 9.5 mg/dL (ref 8.9–10.3)
Creatinine, Ser: 0.82 mg/dL (ref 0.44–1.00)
GFR calc Af Amer: >60 mL/min (ref >60)
GFR calc non Af Amer: >60 mL/min (ref >60)
Glucose, Bld: 161 mg/dL — ABNORMAL HIGH (ref 65–99)
POTASSIUM: 3.8 mmol/L (ref 3.5–5.1)
SODIUM: 139 mmol/L (ref 135–145)

## 2016-01-03 LAB — I-STAT TROPONIN, ED: TROPONIN I, POC: 0 ng/mL (ref 0.00–0.08)

## 2016-01-03 LAB — MAGNESIUM
MAGNESIUM: 1.7 mg/dL (ref 1.7–2.4)
MAGNESIUM: 1.6 mg/dL — AB (ref 1.7–2.4)

## 2016-01-03 LAB — TSH: TSH: 1.093 u[IU]/mL (ref 0.350–4.500)

## 2016-01-03 SURGERY — PACEMAKER IMPLANT

## 2016-01-03 SURGERY — TEMPORARY PACEMAKER

## 2016-01-03 MED ORDER — SODIUM CHLORIDE 0.9% FLUSH: 3.0000 mL | INTRAVENOUS | Status: DC | PRN

## 2016-01-03 MED ORDER — LIDOCAINE HCL (PF) 1 % IJ SOLN
INTRAMUSCULAR | Status: AC
  Filled 2016-01-03: qty 30

## 2016-01-03 MED ORDER — DEXTROSE 5 % IV SOLN
2.0000 ug/min | Freq: Once | INTRAVENOUS | Status: AC
  Administered 2016-01-03: 2 ug/min via INTRAVENOUS
  Filled 2016-01-03: qty 5

## 2016-01-03 MED ORDER — ONDANSETRON HCL 4 MG/2ML IJ SOLN
INTRAMUSCULAR | Status: AC
  Filled 2016-01-03: qty 2

## 2016-01-03 MED ORDER — ONDANSETRON HCL 4 MG/2ML IJ SOLN
4.0000 mg | Freq: Once | INTRAMUSCULAR | Status: AC
  Administered 2016-01-03: 4 mg via INTRAVENOUS

## 2016-01-03 MED ORDER — FENTANYL CITRATE (PF) 100 MCG/2ML IJ SOLN
INTRAMUSCULAR | Status: AC
  Filled 2016-01-03: qty 2

## 2016-01-03 MED ORDER — ONDANSETRON HCL 4 MG/2ML IJ SOLN
INTRAMUSCULAR | Status: DC | PRN
  Administered 2016-01-03: 4 mg via INTRAVENOUS

## 2016-01-03 MED ORDER — LIDOCAINE HCL (PF) 1 % IJ SOLN
INTRAMUSCULAR | Status: DC | PRN
  Administered 2016-01-03: 25 mL via INTRADERMAL

## 2016-01-03 MED ORDER — FENTANYL CITRATE (PF) 100 MCG/2ML IJ SOLN
150.0000 ug | Freq: Once | INTRAMUSCULAR | Status: AC
  Administered 2016-01-03: 50 ug via INTRAVENOUS
  Filled 2016-01-03: qty 4

## 2016-01-03 MED ORDER — SODIUM CHLORIDE 0.9 % IV SOLN: 250.0000 mL | INTRAVENOUS | Status: DC | PRN

## 2016-01-03 MED ORDER — VITAMIN D 1000 UNITS PO TABS
2000.0000 [IU] | ORAL_TABLET | Freq: Every day | ORAL | Status: DC
  Administered 2016-01-03 – 2016-01-05 (×3): 2000 [IU] via ORAL
  Filled 2016-01-03 (×3): qty 2

## 2016-01-03 MED ORDER — LOSARTAN POTASSIUM 50 MG PO TABS
100.0000 mg | ORAL_TABLET | Freq: Every morning | ORAL | Status: DC
  Administered 2016-01-03 – 2016-01-05 (×3): 100 mg via ORAL
  Filled 2016-01-03 (×3): qty 2

## 2016-01-03 MED ORDER — MIDAZOLAM HCL 2 MG/2ML IJ SOLN
INTRAMUSCULAR | Status: DC | PRN
  Administered 2016-01-03 (×2): 1 mg via INTRAVENOUS

## 2016-01-03 MED ORDER — SODIUM CHLORIDE 0.9% FLUSH
3.0000 mL | Freq: Two times a day (BID) | INTRAVENOUS | Status: DC
  Administered 2016-01-03: 10 mL via INTRAVENOUS
  Administered 2016-01-03 – 2016-01-04 (×3): 3 mL via INTRAVENOUS

## 2016-01-03 MED ORDER — PANTOPRAZOLE SODIUM 40 MG PO TBEC
40.0000 mg | DELAYED_RELEASE_TABLET | Freq: Every day | ORAL | Status: DC
  Administered 2016-01-03 – 2016-01-05 (×3): 40 mg via ORAL
  Filled 2016-01-03 (×3): qty 1

## 2016-01-03 MED ORDER — ACETAMINOPHEN 325 MG PO TABS: 650.0000 mg | ORAL_TABLET | Freq: Four times a day (QID) | ORAL | Status: DC | PRN

## 2016-01-03 MED ORDER — ACETAMINOPHEN 650 MG RE SUPP: 650.0000 mg | Freq: Four times a day (QID) | RECTAL | Status: DC | PRN

## 2016-01-03 MED ORDER — FENTANYL CITRATE (PF) 100 MCG/2ML IJ SOLN
INTRAMUSCULAR | Status: DC | PRN
  Administered 2016-01-03: 25 ug via INTRAVENOUS

## 2016-01-03 MED ORDER — ASPIRIN EC 81 MG PO TBEC
81.0000 mg | DELAYED_RELEASE_TABLET | Freq: Every day | ORAL | Status: DC
  Administered 2016-01-03 – 2016-01-05 (×3): 81 mg via ORAL
  Filled 2016-01-03 (×3): qty 1

## 2016-01-03 MED ORDER — HEPARIN (PORCINE) IN NACL 2-0.9 UNIT/ML-% IJ SOLN
INTRAMUSCULAR | Status: AC
  Filled 2016-01-03: qty 500

## 2016-01-03 MED ORDER — SODIUM CHLORIDE 0.9 % IV SOLN
150.0000 ug/h | INTRAVENOUS | Status: DC
  Administered 2016-01-03: 150 ug/h via INTRAVENOUS
  Filled 2016-01-03: qty 50

## 2016-01-03 MED ORDER — MIDAZOLAM HCL 2 MG/2ML IJ SOLN
INTRAMUSCULAR | Status: AC
  Filled 2016-01-03: qty 2

## 2016-01-03 SURGICAL SUPPLY — 3 items
CATH S G BIP PACING (SET/KITS/TRAYS/PACK) ×1 IMPLANT
SHEATH PINNACLE 6F 10CM (SHEATH) ×1 IMPLANT
SLEEVE REPOSITIONING LENGTH 30 (MISCELLANEOUS) ×1 IMPLANT

## 2016-01-03 NOTE — Consult Note (Signed)
ELECTROPHYSIOLOGY CONSULT NOTE    Patient ID: Kelly Castaneda MRN: [id number], DOB/AGE: 67-Jul-1952 67 y.o.  Admit date: 01/03/2016 Date of Consult: 01/03/2016  Primary Physician: Pearla DubonnetGATES,ROBERT NEVILL, MD Primary Cardiologist: Dr. Eldridge DaceVaranasi  Reason for Consultation: CHB, evaluate for PPM  HPI: Kelly Castaneda is a 65 y.o. female with PMHx of hypothyroidism is mentioned in her chart though she denies this, she had hyperparathyroidism and 1-2 parathyroid glands removed a few years back, GERD, HTN with recent history of syncope 11/29/15 was wearing a heart monitor for evaluation of this received a call from the monitoring company that she was in CHB, she was symptomatic and EMS was called, found in CHB.  She was given Isuprel in the ER without improvement requiring transcutaneous pacing and then temp wire was placed, currently V paced 70 and feeling improved with resolved nausea.  The pt was seen +/- 10 years ago by cardiology for an episode of CHF, she states they found no heart condition or weakened heart muscle and the event was unexplained, she has been on coreg since that time and has not had any recurrent CHF, states Dr. Eldridge DaceVaranasi discharged her from the practice may years ago.  She has had a few syncopal events over the last couple years, unexplained.  Last month she had some warning, but otherwise no pre or post syncope symptoms.  She has not had any kind of CP, palpitations or SOB.   Arrived 0240 this morning BUN/Creat 23/0.82 >> 24/0/74 K+ 3.8 Mag 1.7 Trop 0.00 H/H 11/26, wbc 8.6, plts 197 TSH is pending  Past Medical History  Diagnosis Date  . Hypertension   . CHF (congestive heart failure) (HCC)   . Thyroid disease   . Bell's palsy   . Shingles   . History of kidney stones   . GERD (gastroesophageal reflux disease)   . H/O hiatal hernia   . Headache(784.0)     migraines  . Arthritis      Surgical History:  Past Surgical History  Procedure Laterality Date  . Abdominal  hysterectomy    . Cholecystectomy    . Tubal ligation    . Dilation and curettage of uterus    . Cardiac catheterization      "years ago"  . Back surgery      lumbar/ discectomy  . Breast surgery Right     biopsy with titanium clip placement  . Parathyroidectomy Right   . Lithotripsy    . Total knee arthroplasty Left 09/25/2013    Procedure: LEFT TOTAL KNEE ARTHROPLASTY;  Surgeon: Loanne DrillingFrank V Aluisio, MD;  Location: [facility];  Service: Orthopedics;  Laterality: Left;  . Cardiac catheterization N/A 01/03/2016    Procedure: Temporary Pacemaker;  Surgeon: Lennette Biharihomas A Kelly, MD;  Location: [facility];  Service: Cardiovascular;  Laterality: N/A;     Prescriptions prior to admission  Medication Sig Dispense Refill Last Dose  . aspirin EC 81 MG tablet Take 81 mg by mouth daily.   11/28/2015 at Unknown time  . carvedilol (COREG) 25 MG tablet Take 25 mg by mouth 2 (two) times daily with a meal.   11/28/2015 at 0830  . Cholecalciferol (VITAMIN D) 2000 UNITS tablet Take 2,000 Units by mouth daily.   11/28/2015 at Unknown time  . furosemide (LASIX) 80 MG tablet Take one tablet up to four times weekly. 16 tablet 0 Past Week at Unknown time  . losartan (COZAAR) 100 MG tablet Take 100 mg by mouth every morning.  11/28/2015 at Unknown time  . naproxen sodium (ANAPROX) 220 MG tablet Take 440 mg by mouth daily as needed (arthritis).   11/28/2015 at Unknown time  . omeprazole (PRILOSEC) 20 MG capsule Take 20 mg by mouth daily. For heartburn   11/28/2015 at Unknown time    Inpatient Medications:  . aspirin EC  81 mg Oral Daily  . cholecalciferol  2,000 Units Oral Daily  . losartan  100 mg Oral q morning - 10a  . pantoprazole  40 mg Oral Daily  . sodium chloride flush  3 mL Intravenous Q12H  . sodium chloride flush  3 mL Intravenous Q12H    Allergies:  Allergies  Allergen Reactions  . Codeine Nausea And Vomiting    Social History   Social History  . Marital Status: Divorced    Spouse Name: N/A  .  Number of Children: N/A  . Years of Education: N/A   Occupational History  . Not on file.   Social History Main Topics  . Smoking status: Never Smoker   . Smokeless tobacco: Never Used  . Alcohol Use: No  . Drug Use: No  . Sexual Activity: Not on file   Other Topics Concern  . Not on file   Social History Narrative     Family History  Problem Relation Age of Onset  . Diabetes Mother   . CAD Father      Review of Systems: All other systems reviewed and are otherwise negative except as noted above.  Physical Exam: Filed Vitals:   01/03/16 0513 01/03/16 0517 01/03/16 0600 01/03/16 0830  BP:   123/82 137/75  Pulse: 149 0 63 69  Temp:   97.4 F (36.3 C) 97.3 F (36.3 C)  TempSrc:   Oral Oral  Resp: 11 24 14 17   Height:      Weight:      SpO2: 63% 0% 92% 100%    GEN- The patient is well appearing, alert and oriented x 3 today.   HEENT: normocephalic, atraumatic; sclera clear, conjunctiva pink; hearing intact; oropharynx clear; neck supple, no JVP Lymph- no cervical lymphadenopathy Lungs- Clear to ausculation bilaterally, normal work of breathing.  No wheezes, rales, rhonchi Heart- Regular rate and rhythm, no murmurs, rubs or gallops, PMI not laterally displaced GI- soft, non-tender, non-distended Extremities- no clubbing, cyanosis, or edema MS- no significant deformity or atrophy Skin- warm and dry, no rash or lesion Psych- euthymic mood, full affect Neuro- no gross deficits observed  Labs:   Lab Results  Component Value Date   WBC 6.9 01/03/2016   HGB 11.9* 01/03/2016   HCT 37.3 01/03/2016   MCV 95.2 01/03/2016   PLT 191 01/03/2016    Recent Labs Lab 01/03/16 0305 01/03/16 0316  NA 139 142  K 3.8 3.8  CL 108 106  CO2 22  --   BUN 23* 24*  CREATININE 0.82 0.70  CALCIUM 9.5  --   GLUCOSE 161* 159*      Radiology/Studies:  Dg Chest Port 1 View 01/03/2016  CLINICAL DATA:  Heart block. Weakness, nausea and diaphoresis tonight. EXAM: PORTABLE CHEST  1 VIEW COMPARISON:  11/28/2015 FINDINGS: Chronic elevation of the right hemidiaphragm with adjacent atelectasis or scarring. Stable cardiomegaly. Probable atelectasis at the left lung base, suboptimally assessed due to overlying monitoring device. No pulmonary edema, pleural effusion or pneumothorax. IMPRESSION: Chronic elevation of right hemidiaphragm with adjacent atelectasis or scarring. Probable left basilar atelectasis, suboptimally assessed due to overlying artifact from monitoring devices. Electronically Signed  By: Rubye Oaks M.D.   On: 01/03/2016 03:48    EKG: CBH 35bpm, RBBB, QRS 11/28/15 SR, RBBB, LAD, PR 12/01/14:  SR, RBBB, LAFB, PR TELEMETRY: V paced currently, previouly CHB 30's with V standstill of 14 seconds  11/29/15: Echocardiogram Study Conclusions - Left ventricle: The cavity size was mildly dilated. Wall  thickness was normal. Systolic function was normal. The estimated  ejection fraction was in the range of 50% to 55%. Wall motion was  normal; there were no regional wall motion abnormalities. Doppler  parameters are consistent with abnormal left ventricular  relaxation (grade 1 diastolic dysfunction). - Left atrium: The atrium was mildly dilated.   Assessment and Plan:   1. Symptomatic bradycardia, CHB      S/p temp wire early this morning      BP stable, last dose was last evening (01/02/16 PM)      Pending TSH      Coreg  BID at home  Recommend allowing the BB to wash out prior to implanting PPM  2. HTN     BP stable  Signed, Francis Dowse, PA-C 01/03/2016 9:11 AM    I have seen and examined this patient with Francis Dowse.  Agree with above, note added to reflect my findings.  On exam, regular rhythm, no murmurs, lungs clear.  Presented with complete AV block and now with temporary wire in place.  Took 25 mg coreg last night.  Due to her recent beta blocker use, Zalea Pete plan to let it was out prior to pacemaker placement to see if  she has returned conduction.      Zaion Hreha M. Olivianna Higley MD 01/03/2016 5:03 PM

## 2016-01-03 NOTE — Progress Notes (Signed)
Wasted 150ml of 2500mcg of Fentanyl down sink with Marguarite ArbourKaylee Q, RN. Milon DikesKristina D Yecheskel Kurek, RN

## 2016-01-03 NOTE — ED Provider Notes (Signed)
CSN: [id number]     Arrival date & time 01/03/16  0232 History  By signing my name below, I, Kelly Castaneda, attest that this documentation has been prepared under the direction and in the presence of [name], MD. Electronically signed, Kelly Castaneda, ED Scribe. 01/03/2016. 3:00 AM.   Chief Complaint  Patient presents with  . Irregular Heart Beat   The history is provided by the patient. No language interpreter was used.   HPI Comments: Kelly Castaneda is a 65 y.o. female with a PMHx of HTN, Thyroid disease and CHF, brought in by ambulance, who presents to the Emergency Department for an irregular heartbeat that started tonight. Pt was placed on a heart rate monitor on 12/06/15 and states she received a call from [employer] this evening. She was advised to come to the ED as they detected an irregular heartbeat on her monitor. Pt also reports associated weakness and mild nausea. Pt denies any recent sickness, vomiting, diarrhea, chest pain. Pt was seen on 4/27 in the Emergency Department after an episode of LOC. At that time, pt was admitted to the hospital for overnight observation.   Past Medical History  Diagnosis Date  . Hypertension   . CHF (congestive heart failure) (HCC)   . Thyroid disease   . Bell's palsy   . Shingles   . History of kidney stones   . GERD (gastroesophageal reflux disease)   . H/O hiatal hernia   . Headache(784.0)     migraines  . Arthritis    Past Surgical History  Procedure Laterality Date  . Abdominal hysterectomy    . Cholecystectomy    . Tubal ligation    . Dilation and curettage of uterus    . Cardiac catheterization      "years ago"  . Back surgery      lumbar/ discectomy  . Breast surgery Right     biopsy with titanium clip placement  . Parathyroidectomy Right   . Lithotripsy    . Total knee arthroplasty Left 09/25/2013    Procedure: LEFT TOTAL KNEE ARTHROPLASTY;  Surgeon: [name], MD;  Location: [facility];  Service: Orthopedics;  Laterality: Left;    Family History  Problem Relation Age of Onset  . Diabetes Mother   . CAD Father    Social History  Substance Use Topics  . Smoking status: Never Smoker   . Smokeless tobacco: Never Used  . Alcohol Use: No   OB History    No data available     Review of Systems  Cardiovascular: Negative for chest pain.  Gastrointestinal: Positive for nausea. Negative for vomiting and diarrhea.  Neurological: Positive for weakness.  All other systems reviewed and are negative.    Allergies  Codeine  Home Medications   Prior to Admission medications   Medication Sig Start Date End Date Taking? Authorizing Provider  aspirin EC 81 MG tablet Take 81 mg by mouth daily.    Historical Provider, MD  carvedilol (COREG) 25 MG tablet Take 25 mg by mouth 2 (two) times daily with a meal.    Historical Provider, MD  Cholecalciferol (VITAMIN D) 2000 UNITS tablet Take 2,000 Units by mouth daily.    Historical Provider, MD  furosemide (LASIX) 80 MG tablet Take one tablet up to four times weekly. 03/15/14   [name], MD  losartan (COZAAR) 100 MG tablet Take 100 mg by mouth every morning. 09/06/13   [name], MD  naproxen sodium (ANAPROX) 220 MG  tablet Take 440 mg by mouth daily as needed (arthritis).    Historical Provider, MD  omeprazole (PRILOSEC) 20 MG capsule Take 20 mg by mouth daily. For heartburn    Historical Provider, MD   BP 137/58 mmHg  Pulse 34  Resp 14  Ht 5\' 5"  (1.651 m)  Wt 228 lb (103.42 kg)  BMI 37.94 kg/m2  SpO2 100% Physical Exam  Constitutional: She is oriented to person, place, and time. She appears well-developed and well-nourished. No distress.  HENT:  Head: Normocephalic and atraumatic.  Nose: Nose normal.  Mouth/Throat: Oropharynx is clear and moist. No oropharyngeal exudate.  Eyes: Conjunctivae and EOM are normal. Pupils are equal, round, and reactive to light. No scleral icterus.  Neck: Normal range of motion. Neck supple. No JVD present. No tracheal  deviation present. No thyromegaly present.  Cardiovascular: Regular rhythm and normal heart sounds.  Exam reveals no gallop and no friction rub.   No murmur heard. Bradycardic   Pulmonary/Chest: Effort normal and breath sounds normal. No respiratory distress. She has no wheezes. She exhibits no tenderness.  Abdominal: Soft. Bowel sounds are normal. She exhibits no distension and no mass. There is no tenderness. There is no rebound and no guarding.  Musculoskeletal: Normal range of motion. She exhibits no edema or tenderness.  Lymphadenopathy:    She has no cervical adenopathy.  Neurological: She is alert and oriented to person, place, and time. No cranial nerve deficit. She exhibits normal muscle tone.  Skin: Skin is warm and dry. No rash noted. No erythema. There is pallor.  Pale  Nursing note and vitals reviewed.     Procedures (including critical care time) DIAGNOSTIC STUDIES: Oxygen Saturation is 100% on RA, normal by my interpretation.  COORDINATION OF CARE: 2:59 AM-Fentanyl drip, blood work and will pace patient. Discussed treatment plan with pt at bedside and pt agreed to plan.   Labs Review Labs Reviewed  CBC WITH DIFFERENTIAL/PLATELET - Abnormal; Notable for the following:    RBC 3.86 (*)    Hemoglobin 11.6 (*)    All other components within normal limits  BASIC METABOLIC PANEL - Abnormal; Notable for the following:    Glucose, Bld 161 (*)    BUN 23 (*)    All other components within normal limits  I-STAT CHEM 8, ED - Abnormal; Notable for the following:    BUN 24 (*)    Glucose, Bld 159 (*)    All other components within normal limits  MAGNESIUM  I-STAT TROPOININ, ED    Imaging Review Dg Chest Port 1 View  01/03/2016  CLINICAL DATA:  Heart block. Weakness, nausea and diaphoresis tonight. EXAM: PORTABLE CHEST 1 VIEW COMPARISON:  11/28/2015 FINDINGS: Chronic elevation of the right hemidiaphragm with adjacent atelectasis or scarring. Stable cardiomegaly. Probable  atelectasis at the left lung base, suboptimally assessed due to overlying monitoring device. No pulmonary edema, pleural effusion or pneumothorax. IMPRESSION: Chronic elevation of right hemidiaphragm with adjacent atelectasis or scarring. Probable left basilar atelectasis, suboptimally assessed due to overlying artifact from monitoring devices. Electronically Signed   By: Rubye OaksMelanie  Ehinger M.D.   On: 01/03/2016 03:48   I have personally reviewed and evaluated these images and lab results as part of my medical decision-making.   EKG Interpretation None      MDM   Final diagnoses:  None    Patient presents emergency department for lightheadedness and nausea. I was called immediately as the patient was in complete heart block and this is confirmed by  EKG. She appeared pale and stated she was lightheaded. Pads were medially placed on the patient, I spoke with Dr. Tresa Endo with cardiology who recommended to place the patient on an isoproterenol drip. This is performed. Unfortunately, patient went asystolic and she began to be paced in the emergency department at a rate of 60. After just a few seconds her color returned and she stated she felt much better. I spoke with Dr. Tresa Endo again to activate Cath Lab to place a pacemaker her tonight.  She continues to be paced and on isoproterenol drip. She continues to be in critical condition. Patient will be admitted for further care.   CRITICAL CARE Performed by: [name]   Total critical care time: 40 minutes - complete heart block  Critical care time was exclusive of separately billable procedures and treating other patients.  Critical care was necessary to treat or prevent imminent or life-threatening deterioration.  Critical care was time spent personally by me on the following activities: development of treatment plan with patient and/or surrogate as well as nursing, discussions with consultants, evaluation of patient's response to treatment,  examination of patient, obtaining history from patient or surrogate, ordering and performing treatments and interventions, ordering and review of laboratory studies, ordering and review of radiographic studies, pulse oximetry and re-evaluation of patient's condition.    I personally performed the services described in this documentation, which was scribed in my presence. The recorded information has been reviewed and is accurate.       [name], MD 01/03/16 [phone]

## 2016-01-03 NOTE — ED Notes (Signed)
Pt has been wearing heart monitor from cardiologist since 12/06/15 d/t syncopal episode. Pt received call tonight from [employer] stating she needed to go to the ER. When [facility] arrived pt had hr in the 30's in complete heart block. Pt admits to weakness, n/v, diaphoresis.

## 2016-01-03 NOTE — Progress Notes (Signed)
Chaplain was paged at 4:00am and provided spiritual care via prayer for both Pt and Pt's family

## 2016-01-03 NOTE — ED Notes (Signed)
Zoll pads placed on patient on arrival

## 2016-01-03 NOTE — ED Notes (Signed)
Dr. Tresa EndoKelly at bedside, vo to turn isoproterenol up to 609mcg/min and pacer down to 50 bpm. Pt began vomiting and became pale and diaphoretic. Pt hr dropped into 40's and started pacing again. Pacer increased to 70 bpm per vo from dr. Tresa EndoKelly.

## 2016-01-03 NOTE — ED Notes (Signed)
Pt remains diaphoretic and restless. Dr.Oni aware Awaiting cardiology

## 2016-01-03 NOTE — H&P (Addendum)
Kelly Castaneda is an 65 y.o. female.   Chief Complaint: nausea, vomiting, lightheadedness, passing out,  Primary Cardiologist: Dr. Irish Lack HPI: Kelly Castaneda is a 65 yo woman with PMH of hypothyroidism, hypertension, GERD who was admitted briefly 11/28/15 with syncope but no findings and echocardiogram with preserved EF who was called by her heart monitoring company tonight for complete heart block and when EMS arrived she was nauseated, vomiting in complete heart block in the 30s. At [facility], isoproteronol was trialled in the ER but she required transcutaneous pacing with breath-through bradycardia and asystole before transcutaneous pacing so the cath lab team was called in for emergent temporary pacemaker.   She denies recent infectious symptoms, no fever/chills/diarrhea. No sick contacts. She otherwise denies limitations or significant exercise intolerance at home.       Past Medical History  Diagnosis Date  . Hypertension   . CHF (congestive heart failure) ([place])   . Thyroid disease   . Bell's palsy   . Shingles   . History of kidney stones   . GERD (gastroesophageal reflux disease)   . H/O hiatal hernia   . Headache(784.0)     migraines  . Arthritis     Past Surgical History  Procedure Laterality Date  . Abdominal hysterectomy    . Cholecystectomy    . Tubal ligation    . Dilation and curettage of uterus    . Cardiac catheterization      "years ago"  . Back surgery      lumbar/ discectomy  . Breast surgery Right     biopsy with titanium clip placement  . Parathyroidectomy Right   . Lithotripsy    . Total knee arthroplasty Left 09/25/2013    Procedure: LEFT TOTAL KNEE ARTHROPLASTY;  Surgeon: Gearlean Alf, MD;  Location: [facility];  Service: Orthopedics;  Laterality: Left;    Family History  Problem Relation Age of Onset  . Diabetes Mother   . CAD Father    Social History:  reports that she has never smoked. She has never used smokeless tobacco. She reports that she  does not drink alcohol or use illicit drugs.  Allergies:  Allergies  Allergen Reactions  . Codeine Nausea And Vomiting     (Not in a hospital admission)  Results for orders placed or performed during the hospital encounter of 01/03/16 (from the past 48 hour(s))  CBC with Differential/Platelet     Status: Abnormal   Collection Time: 01/03/16  3:05 AM  Result Value Ref Range   WBC 8.6 4.0 - 10.5 K/uL   RBC 3.86 (L) 3.87 - 5.11 MIL/uL   Hemoglobin 11.6 (L) 12.0 - 15.0 g/dL   HCT 36.0 36.0 - 46.0 %   MCV 93.3 78.0 - 100.0 fL   MCH 30.1 26.0 - 34.0 pg   MCHC 32.2 30.0 - 36.0 g/dL   RDW 13.3 11.5 - 15.5 %   Platelets 197 150 - 400 K/uL   Neutrophils Relative % 71 %   Neutro Abs 6.0 1.7 - 7.7 K/uL   Lymphocytes Relative 23 %   Lymphs Abs 2.0 0.7 - 4.0 K/uL   Monocytes Relative 5 %   Monocytes Absolute 0.4 0.1 - 1.0 K/uL   Eosinophils Relative 1 %   Eosinophils Absolute 0.1 0.0 - 0.7 K/uL   Basophils Relative 0 %   Basophils Absolute 0.0 0.0 - 0.1 K/uL  Basic metabolic panel     Status: Abnormal   Collection Time: 01/03/16  3:05 AM  Result Value Ref Range   Sodium 139 135 - 145 mmol/L   Potassium 3.8 3.5 - 5.1 mmol/L   Chloride 108 101 - 111 mmol/L   CO2 22 22 - 32 mmol/L   Glucose, Bld 161 (H) 65 - 99 mg/dL   BUN 23 (H) 6 - 20 mg/dL   Creatinine, Ser 0.82 0.44 - 1.00 mg/dL   Calcium 9.5 8.9 - 10.3 mg/dL   GFR calc non Af Amer >60 >60 mL/min   GFR calc Af Amer >60 >60 mL/min    Comment: (NOTE) The eGFR has been calculated using the CKD EPI equation. This calculation has not been validated in all clinical situations. eGFR's persistently <60 mL/min signify possible Chronic Kidney Disease.    Anion gap 9 5 - 15  Magnesium     Status: None   Collection Time: 01/03/16  3:05 AM  Result Value Ref Range   Magnesium 1.7 1.7 - 2.4 mg/dL  I-stat troponin, ED     Status: None   Collection Time: 01/03/16  3:15 AM  Result Value Ref Range   Troponin i, poc 0.00 0.00 - 0.08 ng/mL    Comment 3            Comment: Due to the release kinetics of cTnI, a negative result within the first hours of the onset of symptoms does not rule out myocardial infarction with certainty. If myocardial infarction is still suspected, repeat the test at appropriate intervals.   I-stat chem 8, ed     Status: Abnormal   Collection Time: 01/03/16  3:16 AM  Result Value Ref Range   Sodium 142 135 - 145 mmol/L   Potassium 3.8 3.5 - 5.1 mmol/L   Chloride 106 101 - 111 mmol/L   BUN 24 (H) 6 - 20 mg/dL   Creatinine, Ser 0.70 0.44 - 1.00 mg/dL   Glucose, Bld 159 (H) 65 - 99 mg/dL   Calcium, Ion 1.21 1.13 - 1.30 mmol/L   TCO2 23 0 - 100 mmol/L   Hemoglobin 12.6 12.0 - 15.0 g/dL   HCT 37.0 36.0 - 46.0 %   Dg Chest Port 1 View  01/03/2016  CLINICAL DATA:  Heart block. Weakness, nausea and diaphoresis tonight. EXAM: PORTABLE CHEST 1 VIEW COMPARISON:  11/28/2015 FINDINGS: Chronic elevation of the right hemidiaphragm with adjacent atelectasis or scarring. Stable cardiomegaly. Probable atelectasis at the left lung base, suboptimally assessed due to overlying monitoring device. No pulmonary edema, pleural effusion or pneumothorax. IMPRESSION: Chronic elevation of right hemidiaphragm with adjacent atelectasis or scarring. Probable left basilar atelectasis, suboptimally assessed due to overlying artifact from monitoring devices. Electronically Signed   By: Jeb Levering M.D.   On: 01/03/2016 03:48    Review of Systems  Constitutional: Positive for malaise/fatigue and diaphoresis. Negative for fever and chills.  HENT: Negative for ear discharge and ear pain.   Eyes: Negative for photophobia and pain.  Respiratory: Negative for sputum production.   Cardiovascular: Negative for chest pain, orthopnea and leg swelling.  Gastrointestinal: Positive for nausea and vomiting. Negative for diarrhea and constipation.  Genitourinary: Negative for urgency and frequency.  Musculoskeletal: Positive for joint  pain. Negative for myalgias.  Skin: Negative for itching and rash.  Neurological: Positive for dizziness. Negative for sensory change, speech change and headaches.  Endo/Heme/Allergies: Negative for polydipsia.  Psychiatric/Behavioral: Negative for depression, suicidal ideas and substance abuse.    Blood pressure 156/62, pulse 40, resp. rate 22, height 5' 5" (1.651 m), weight 103.42 kg (228 lb),  SpO2 100 %. Physical Exam  Nursing note and vitals reviewed. Constitutional: She is oriented to person, place, and time. She appears well-developed and well-nourished. She appears distressed.  HENT:  Head: Normocephalic and atraumatic.  Nose: Nose normal.  Mouth/Throat: Oropharynx is clear and moist. No oropharyngeal exudate.  Eyes: Conjunctivae and EOM are normal. Pupils are equal, round, and reactive to light. No scleral icterus.  Neck: Normal range of motion. Neck supple. No JVD present. No tracheal deviation present.  Cardiovascular: Intact distal pulses.   No murmur heard. Bradycardic, irregular without pacing and paced, regular with pacing  Respiratory: Effort normal and breath sounds normal. No respiratory distress. She has no wheezes.  GI: Soft. Bowel sounds are normal. She exhibits no distension. There is no tenderness. There is no rebound.  Musculoskeletal: Normal range of motion. She exhibits no edema or tenderness.  Neurological: She is alert and oriented to person, place, and time. No cranial nerve deficit. Coordination normal.  Skin: No rash noted. She is diaphoretic. No erythema. No pallor.  Psychiatric: She has a normal mood and affect. Her behavior is normal. Thought content normal.   labs reviewed Echo 4/17: EF 50-55%, grade I DD, mildly dilated LA EKG: complete heart block LHC 12/03/2010: 50-60% proximal diagonal, 25% mid LAD, PDA large, mild irregularities in the large OM1, EF 25-30% with global hypokinesis and EF 25-30%  Assessment/Plan Ms. Nugent is a 65 yo woman with PMH  of hypothyroidism, hypertension, GERD who was admitted briefly 11/28/15 with syncope but no findings and echocardiogram with preserved EF who was called by her heart monitoring company tonight for complete heart block and developed symptoms afterwards. Given inability to overdrive pace her with isoproteronol, transcutaneous pacing was requiring and cath lab team called in for emergent pacemaker. Will consult EP for permanent pacemaker this AM.   Problem List 1. Complete Heart Block 2. Syncope and collapse 3. Hypothyroidism 4. Hypertension 5. GERD  Plan 1. Continue isoproteronol and transcutaneous pacing until temporary pacemaker can be placed (cath lab coming in) 2. Consult EP in AM 3. Continue synthroid 4. CBC, CMP, TSH, BNP 5. Hold home coreg 25 mg bid (can restart after permanent PM) 6. Continue home losartan with BP holding parameters   KELLY, JACOB, MD 01/03/2016, 4:08 AM   Patient seen and examined. Agree with assessment and plan.  Upon arrival to the catheterization laboratory, the patient was undergoing transcutaneous pacing was very uncomfortable.  The procedure was discussed with the patient and she agreed to proceed..  A temporary transvenous pacemaker was inserted via the right femoral vein and advanced to the RV apex.  There was excellent pacemaker capture.  The pacemaker was set at 70 bpm.  The patient had vomited shortly after insertion of the pacemaker when getting off the table  and her nausea resolved with Zofran 4 mg intravenously.  Her carvedilol will be on hold.  She will need permanent pacemaker.  Laboratory function studies will be assessed including thyroid function studies.   Troy Sine, MD, [facility] 01/03/2016 5:29 AM

## 2016-01-03 NOTE — ED Notes (Signed)
Cardiology Dr. Tresa EndoKelly at bedside speaking with pt.

## 2016-01-04 LAB — CBC
HCT: 36.3 % (ref 36.0–46.0)
HEMOGLOBIN: 11.6 g/dL — AB (ref 12.0–15.0)
MCH: 30.1 pg (ref 26.0–34.0)
MCHC: 32 g/dL (ref 30.0–36.0)
MCV: 94 fL (ref 78.0–100.0)
Platelets: 199 10*3/uL (ref 150–400)
RBC: 3.86 MIL/uL — AB (ref 3.87–5.11)
RDW: 13.5 % (ref 11.5–15.5)
WBC: 7.5 10*3/uL (ref 4.0–10.5)

## 2016-01-04 LAB — GLUCOSE, CAPILLARY: GLUCOSE-CAPILLARY: 114 mg/dL — AB (ref 65–99)

## 2016-01-04 LAB — BASIC METABOLIC PANEL
ANION GAP: 7 (ref 5–15)
BUN: 15 mg/dL (ref 6–20)
CALCIUM: 9.6 mg/dL (ref 8.9–10.3)
CO2: 28 mmol/L (ref 22–32)
Chloride: 106 mmol/L (ref 101–111)
Creatinine, Ser: 0.94 mg/dL (ref 0.44–1.00)
Glucose, Bld: 106 mg/dL — ABNORMAL HIGH (ref 65–99)
Potassium: 3.9 mmol/L (ref 3.5–5.1)
Sodium: 141 mmol/L (ref 135–145)

## 2016-01-04 NOTE — Progress Notes (Signed)
Pt HR dropped in the 30s. MD paged and aware. Pt HR now sustaining in the 50s. Pt asymptomatic. MD wants to be paged before d/c of temporary pacer.

## 2016-01-04 NOTE — Progress Notes (Signed)
Patient ID: Kelly SkeensLoretta H Alameda, female   DOB: [birth year], 65 y.o.   MRN: [id number]    Patient Name: Kelly Castaneda Date of Encounter: 01/04/2016     Principal Problem:   Complete heart block (HCC) Active Problems:   Essential hypertension, benign   GERD (gastroesophageal reflux disease)   Syncope and collapse   Hypothyroidism    SUBJECTIVE  No chest pain or sob.   CURRENT MEDS . aspirin EC  81 mg Oral Daily  . cholecalciferol  2,000 Units Oral Daily  . losartan  100 mg Oral q morning - 10a  . pantoprazole  40 mg Oral Daily  . sodium chloride flush  3 mL Intravenous Q12H  . sodium chloride flush  3 mL Intravenous Q12H    OBJECTIVE  Filed Vitals:   01/04/16 0800 01/04/16 0832 01/04/16 0900 01/04/16 0905  BP: 141/80 141/80 58/42 102/58  Pulse: 70 64 66 69  Temp:  97.4 F (36.3 C)    TempSrc:  Oral    Resp: 20 19 19 31   Height:      Weight:      SpO2: 92% 97% 99% 99%    Intake/Output Summary (Last 24 hours) at 01/04/16 0929 Last data filed at 01/04/16 0900  Gross per 24 hour  Intake   1265 ml  Output    750 ml  Net    515 ml   Filed Weights   01/03/16 0244  Weight: 228 lb (103.42 kg)    PHYSICAL EXAM  General: Pleasant, obese, NAD. Neuro: Alert and oriented X 3. Moves all extremities spontaneously. Psych: Normal affect. HEENT:  Normal  Neck: Supple without bruits or JVD. Lungs:  Resp regular and unlabored, CTA. Heart: RRR no s3, s4, or murmurs. Abdomen: Soft, non-tender, non-distended, BS + x 4.  Extremities: No clubbing, cyanosis or edema. DP/PT/Radials 2+ and equal bilaterally.  Accessory Clinical Findings  CBC  Recent Labs  01/03/16 0305  01/03/16 0824 01/04/16 0447  WBC 8.6  --  6.9 7.5  NEUTROABS 6.0  --  6.0  --   HGB 11.6*  < > 11.9* 11.6*  HCT 36.0  < > 37.3 36.3  MCV 93.3  --  95.2 94.0  PLT 197  --  191 199  < > = values in this interval not displayed. Basic Metabolic Panel  Recent Labs  01/03/16 0305  01/03/16 0824  01/04/16 0447  NA 139  < > 140 141  K 3.8  < > 3.9 3.9  CL 108  < > 107 106  CO2 22  --  27 28  GLUCOSE 161*  < > 143* 106*  BUN 23*  < > 21* 15  CREATININE 0.82  < > 0.78 0.94  CALCIUM 9.5  --  9.3 9.6  MG 1.7  --  1.6*  --   < > = values in this interval not displayed. Liver Function Tests  Recent Labs  01/03/16 0824  AST 25  ALT 21  ALKPHOS 55  BILITOT 0.3  PROT 7.3  ALBUMIN 3.6   No results for input(s): LIPASE, AMYLASE in the last 72 hours. Cardiac Enzymes No results for input(s): CKTOTAL, CKMB, CKMBINDEX, TROPONINI in the last 72 hours. BNP Invalid input(s): POCBNP D-Dimer No results for input(s): DDIMER in the last 72 hours. Hemoglobin A1C No results for input(s): HGBA1C in the last 72 hours. Fasting Lipid Panel No results for input(s): CHOL, HDL, LDLCALC, TRIG, CHOLHDL, LDLDIRECT in the last 72 hours. Thyroid Function Tests  Recent Labs  01/03/16 0824  TSH 1.093    TELE  nsr  Radiology/Studies  Dg Chest Port 1 View  01/03/2016  CLINICAL DATA:  Heart block. Weakness, nausea and diaphoresis tonight. EXAM: PORTABLE CHEST 1 VIEW COMPARISON:  11/28/2015 FINDINGS: Chronic elevation of the right hemidiaphragm with adjacent atelectasis or scarring. Stable cardiomegaly. Probable atelectasis at the left lung base, suboptimally assessed due to overlying monitoring device. No pulmonary edema, pleural effusion or pneumothorax. IMPRESSION: Chronic elevation of right hemidiaphragm with adjacent atelectasis or scarring. Probable left basilar atelectasis, suboptimally assessed due to overlying artifact from monitoring devices. Electronically Signed   By: Rubye Oaks M.D.   On: 01/03/2016 03:48    ASSESSMENT AND PLAN  1. CHB - her CHB has resolved with discontinuation of her coreg. Will plan to remove temp PM wire this afternoon and DC home tomorrow. 2. HTN - her blood pressure is elevated. She will need additional medications which have no effect on AV nodal  conduction which can be started as an outpatient. 3. Obesity - she will need to lose weight.  Gregg Taylor,M.D.  01/04/2016 9:29 AM

## 2016-01-04 NOTE — Progress Notes (Signed)
Pacer and venous sheath removed at this time per MD Ladona Ridgelaylor orders.  Manual pressure held for 10 minutes. Pressure dressing applied.

## 2016-01-05 ENCOUNTER — Encounter (HOSPITAL_COMMUNITY): Payer: Self-pay | Admitting: Nurse Practitioner

## 2016-01-05 DIAGNOSIS — R55 Syncope and collapse: Secondary | ICD-10-CM

## 2016-01-05 DIAGNOSIS — I10 Essential (primary) hypertension: Secondary | ICD-10-CM

## 2016-01-05 MED ORDER — FUROSEMIDE 20 MG PO TABS
20.0000 mg | ORAL_TABLET | Freq: Every day | ORAL | Status: AC
Start: 2016-01-05 — End: ?

## 2016-01-05 MED ORDER — AMLODIPINE BESYLATE 5 MG PO TABS: 5.0000 mg | ORAL_TABLET | Freq: Every day | ORAL | Status: AC

## 2016-01-05 MED ORDER — FUROSEMIDE 20 MG PO TABS
20.0000 mg | ORAL_TABLET | Freq: Every day | ORAL | Status: DC
  Administered 2016-01-05: 20 mg via ORAL
  Filled 2016-01-05: qty 1

## 2016-01-05 MED ORDER — AMLODIPINE BESYLATE 5 MG PO TABS
5.0000 mg | ORAL_TABLET | Freq: Every day | ORAL | Status: DC
  Administered 2016-01-05: 5 mg via ORAL
  Filled 2016-01-05: qty 1

## 2016-01-05 NOTE — Discharge Instructions (Signed)
***  PLEASE REMEMBER TO BRING ALL OF YOUR MEDICATIONS TO EACH OF YOUR FOLLOW-UP OFFICE VISITS.  

## 2016-01-05 NOTE — Discharge Summary (Signed)
Discharge Summary    Patient ID: Kelly Castaneda,  MRN: [id number], DOB/AGE: 1951/04/24 65 y.o.  Admit date: 01/03/2016 Discharge date: 01/05/2016  Primary Care Provider: GATES,ROBERT NEVILL Primary Cardiologist: Catalina Gravel, MD   Discharge Diagnoses    Principal Problem:   Complete heart block [facility])  **S/p temporary wire this admission.  **Resolved with discontinuation of carvedilol.  Active Problems:   Essential hypertension, benign   GERD (gastroesophageal reflux disease)   Syncope and collapse   Hypothyroidism  Allergies Allergies  Allergen Reactions  . Codeine Nausea And Vomiting  . Fentanyl Nausea And Vomiting  . Morphine And Related Nausea And Vomiting    Diagnostic Studies/Procedures    Temporary Pacing Wire Placement 6.2.2017  A temporary pacemaker was inserted and was advanced to the RV apex under fluoroscopic guidance. Capture was excellent and was set at a rate of 70 bpm. The pacemaker was sutured in place and she'll be transferred to the coronary care unit. _____________   History of Present Illness     65 y/o ? with a h/o HTN, GERD, and hypothyroidism.  She was recently admitted to [facility] in 11/2015 following a syncopal episode.  Echo showed normal LV function and there were not significant arrhythmias noted on telemetry.  She was placed on a 30 day event monitor at discharge.  In the early morning hours of 6/2, Ms. Benjamin was notified by the monitoring company that she was experiencing complete heart block and EMS was called.  She was symptomatic with nausea and vomiting.  Rates were in the 30's upon EMS arrival.  In the ED, she was initially treated with isoproterenol without improvement and then transcutaneous pacing pads were placed.  She was then taken to the cath lab for emergent temporary pacer placement.  Hospital Course     Consultants: Electrophysiology  Following placement of a temporary wire, Ms. Swager stabilized.  She was seen by  electrophysiology who recommended  blocker washout prior to considering permanent pacemaker placement.  She had been on carvedilol therapy at home prior to admission and this was held.  She remained hemodynamically stable and her temporary wire was removed on 6/3.  She has had no further evidence of high grade heart block and will be discharged home today in good condition.  As she will no longer be on carvedilol, we have added amlodipine 5 mg daily and lasix 20 mg daily to her regimen in order to more effectively manage her HTN. _____________  Discharge Vitals Blood pressure 169/96, pulse 70, temperature 97.6 F (36.4 C), temperature source Oral, resp. rate 14, height  (1.651 m), weight 228 lb (103.42 kg), SpO2 99 %.  Filed Weights   01/03/16 0244  Weight: 228 lb (103.42 kg)    Labs & Radiologic Studies    CBC  Recent Labs  01/03/16 0305  01/03/16 0824 01/04/16 0447  WBC 8.6  --  6.9 7.5  NEUTROABS 6.0  --  6.0  --   HGB 11.6*  < > 11.9* 11.6*  HCT 36.0  < > 37.3 36.3  MCV 93.3  --  95.2 94.0  PLT 197  --  191 199  < > = values in this interval not displayed. Basic Metabolic Panel  Recent Labs  01/03/16 0305  01/03/16 0824 01/04/16 0447  NA 139  < > 140 141  K 3.8  < > 3.9 3.9  CL 108  < > 107 106  CO2 22  --  27  28  GLUCOSE 161*  < > 143* 106*  BUN 23*  < > 21* 15  CREATININE 0.82  < > 0.78 0.94  CALCIUM 9.5  --  9.3 9.6  MG 1.7  --  1.6*  --   < > = values in this interval not displayed. Liver Function Tests  Recent Labs  01/03/16 0824  AST 25  ALT 21  ALKPHOS 55  BILITOT 0.3  PROT 7.3  ALBUMIN 3.6   Thyroid Function Tests  Recent Labs  01/03/16 0824  TSH 1.093   _____________  Dg Chest Port 1 View  01/03/2016  CLINICAL DATA:  Heart block. Weakness, nausea and diaphoresis tonight. EXAM: PORTABLE CHEST 1 VIEW COMPARISON:  11/28/2015 FINDINGS: Chronic elevation of the right hemidiaphragm with adjacent atelectasis or scarring. Stable cardiomegaly.  Probable atelectasis at the left lung base, suboptimally assessed due to overlying monitoring device. No pulmonary edema, pleural effusion or pneumothorax. IMPRESSION: Chronic elevation of right hemidiaphragm with adjacent atelectasis or scarring. Probable left basilar atelectasis, suboptimally assessed due to overlying artifact from monitoring devices. Electronically Signed   By: Rubye OaksMelanie  Ehinger M.D.   On: 01/03/2016 03:48   Disposition   Pt is being discharged home today in good condition.  Follow-up Plans & Appointments    Follow-up Information    Follow up with Lance MussJayadeep Varanasi, MD On 01/13/2016.   Specialties:  Cardiology, Radiology, Interventional Cardiology   Why:  9:00 AM   Contact information:   [address] [place] [address] [phone]       Discharge Medications   Current Discharge Medication List    START taking these medications   Details  amLODipine (NORVASC) 5 MG tablet Take 1 tablet (5 mg total) by mouth daily. Qty: 30 tablet, Refills: 6      CONTINUE these medications which have CHANGED   Details  furosemide (LASIX) 20 MG tablet Take 1 tablet (20 mg total) by mouth daily. Qty: 30 tablet, Refills: 6      CONTINUE these medications which have NOT CHANGED   Details  aspirin EC 81 MG tablet Take 81 mg by mouth daily.    Cholecalciferol (VITAMIN D) 2000 UNITS tablet Take 2,000 Units by mouth daily.    losartan (COZAAR) 100 MG tablet Take 100 mg by mouth every morning.    naproxen sodium (ANAPROX) 220 MG tablet Take 440 mg by mouth daily as needed (arthritis).    omeprazole (PRILOSEC) 20 MG capsule Take 20 mg by mouth daily. For heartburn      STOP taking these medications     carvedilol (COREG) 25 MG tablet          Outstanding Labs/Studies   None  Duration of Discharge Encounter   Greater than 30 minutes including physician time.  Signed, Nicolasa Duckinghristopher Berge NP 01/05/2016, 9:18 AM   Cardiology Attending  Patient  seen and examined. Agree with above. He is stable for discharge.   Leonia ReevesGregg Saadiya Wilfong,M.D.

## 2016-01-05 NOTE — Progress Notes (Signed)
Patient ID: MISSI MCMACKIN, female   DOB: May 21, 1951, 65 y.o.   MRN: [id number]    Patient Name: Kelly Castaneda Date of Encounter: 01/05/2016     Principal Problem:   Complete heart block (HCC) Active Problems:   Essential hypertension, benign   GERD (gastroesophageal reflux disease)   Syncope and collapse   Hypothyroidism    SUBJECTIVE  No chest pain or sob.  CURRENT MEDS . amLODipine  5 mg Oral Daily  . aspirin EC  81 mg Oral Daily  . cholecalciferol  2,000 Units Oral Daily  . furosemide  20 mg Oral Daily  . losartan  100 mg Oral q morning - 10a  . pantoprazole  40 mg Oral Daily  . sodium chloride flush  3 mL Intravenous Q12H  . sodium chloride flush  3 mL Intravenous Q12H    OBJECTIVE  Filed Vitals:   01/05/16 0404 01/05/16 0500 01/05/16 0600 01/05/16 0800  BP: 161/77   169/96  Pulse: 74 66 75 70  Temp:    97.6 F (36.4 C)  TempSrc:    Oral  Resp: Height:      Weight:      SpO2: 97% 98% 91% 99%    Intake/Output Summary (Last 24 hours) at 01/05/16 0912 Last data filed at 01/04/16 2000  Gross per 24 hour  Intake    230 ml  Output      0 ml  Net    230 ml   Filed Weights   01/03/16 0244  Weight: 228 lb (103.42 kg)    PHYSICAL EXAM  General: Pleasant, obese, NAD. Neuro: Alert and oriented X 3. Moves all extremities spontaneously. Psych: Normal affect. HEENT:  Normal  Neck: Supple without bruits or JVD. Lungs:  Resp regular and unlabored, CTA. Heart: RRR no s3, s4, or murmurs. Abdomen: Soft, non-tender, non-distended, BS + x 4.  Extremities: No clubbing, cyanosis or edema. DP/PT/Radials 2+ and equal bilaterally.  Accessory Clinical Findings  CBC  Recent Labs  01/03/16 0305  01/03/16 0824 01/04/16 0447  WBC 8.6  --  6.9 7.5  NEUTROABS 6.0  --  6.0  --   HGB 11.6*  < > 11.9* 11.6*  HCT 36.0  < > 37.3 36.3  MCV 93.3  --  95.2 94.0  PLT 197  --  191 199  < > = values in this interval not displayed. Basic Metabolic  Panel  Recent Labs  40/98/11 0305  01/03/16 0824 01/04/16 0447  NA 139  < > 140 141  K 3.8  < > 3.9 3.9  CL 108  < > 107 106  CO2 22  --  27 28  GLUCOSE 161*  < > 143* 106*  BUN 23*  < > 21* 15  CREATININE 0.82  < > 0.78 0.94  CALCIUM 9.5  --  9.3 9.6  MG 1.7  --  1.6*  --   < > = values in this interval not displayed. Liver Function Tests  Recent Labs  01/03/16 0824  AST 25  ALT 21  ALKPHOS 55  BILITOT 0.3  PROT 7.3  ALBUMIN 3.6   No results for input(s): LIPASE, AMYLASE in the last 72 hours. Cardiac Enzymes No results for input(s): CKTOTAL, CKMB, CKMBINDEX, TROPONINI in the last 72 hours. BNP Invalid input(s): POCBNP D-Dimer No results for input(s): DDIMER in the last 72 hours. Hemoglobin A1C No results for input(s): HGBA1C in the last 72 hours. Fasting Lipid Panel  No results for input(s): CHOL, HDL, LDLCALC, TRIG, CHOLHDL, LDLDIRECT in the last 72 hours. Thyroid Function Tests  Recent Labs  01/03/16 0824  TSH 1.093    TELE  nsr with no heart block  Radiology/Studies  Dg Chest Port 1 View  01/03/2016  CLINICAL DATA:  Heart block. Weakness, nausea and diaphoresis tonight. EXAM: PORTABLE CHEST 1 VIEW COMPARISON:  11/28/2015 FINDINGS: Chronic elevation of the right hemidiaphragm with adjacent atelectasis or scarring. Stable cardiomegaly. Probable atelectasis at the left lung base, suboptimally assessed due to overlying monitoring device. No pulmonary edema, pleural effusion or pneumothorax. IMPRESSION: Chronic elevation of right hemidiaphragm with adjacent atelectasis or scarring. Probable left basilar atelectasis, suboptimally assessed due to overlying artifact from monitoring devices. Electronically Signed   By: Rubye OaksMelanie  Ehinger M.D.   On: 01/03/2016 03:48    ASSESSMENT AND PLAN  1. Complete heart block - resolved after stopping coreg. Temporary PM has been removed. She will be discharged off of AV nodal blocking drugs. 2. HTN - her blood pressure is up  since stopping the coreg. Will dc home on low dose lasix and start amlodipine in conjunction with her ARB drugs. 3. Obesity - weight loss has been discussed.   Danyetta Gillham,M.D.  01/05/2016 9:12 AM

## 2016-01-06 SURGERY — PACEMAKER IMPLANT
Anesthesia: LOCAL

## 2016-01-06 MED FILL — Heparin Sodium (Porcine) 2 Unit/ML in Sodium Chloride 0.9%: INTRAMUSCULAR | Qty: 500 | Status: AC

## 2016-01-12 NOTE — Progress Notes (Signed)
Cardiology Office Note   Date:  01/13/2016   ID:  Kelly Castaneda, DOB 04-11-1951, MRN [id number]  PCP:  Pearla DubonnetGATES,ROBERT NEVILL, MD    No chief complaint on file. f/u bradycardia   Wt Readings from Last 3 Encounters:  01/13/16 230 lb (104.327 kg)  01/03/16 228 lb (103.42 kg)  11/28/15 231 lb 14.8 oz (105.2 kg)       History of Present Illness: Kelly Castaneda is a 65 y.o. female  Who has had systolic dysfunction in the past.  She had syncope and monitor showed symptomatic pauses.  She was admitted for bradycardia.  Her bradycardia resolved with stopping carvedilol.  She returns for f/u.  She has had some mild lightheadedness since stopping the carvedilol.  These sx are better.    She has not been as active since all of her HR issues have started.  She is tolerating the lower dose of Lasix.  She is not retaining fluid.     Past Medical History  Diagnosis Date  . Essential hypertension   . Chronic diastolic CHF (congestive heart failure) (HCC)     a. 11/2015 Echo: EF 50-55%, Gr1 DD, mildly dil LA.  Marland Kitchen. Hypothyroidism   . Bell's palsy   . Shingles   . History of kidney stones   . GERD (gastroesophageal reflux disease)   . H/O hiatal hernia   . Headache(784.0)     migraines  . Arthritis   . Complete heart block (HCC)     a. 01/2016 in setting of carevedilol-->req temp wire-->resolved with d/c of beta blocker.  . Syncope     a. 11/2015 - subsequent event monitoring revealed CHF in setting of beta blocker therapy - resolved with discontinuation of coreg.    Past Surgical History  Procedure Laterality Date  . Abdominal hysterectomy    . Cholecystectomy    . Tubal ligation    . Dilation and curettage of uterus    . Cardiac catheterization      "years ago"  . Back surgery      lumbar/ discectomy  . Breast surgery Right     biopsy with titanium clip placement  . Parathyroidectomy Right   . Lithotripsy    . Total knee arthroplasty Left 09/25/2013    Procedure: LEFT  TOTAL KNEE ARTHROPLASTY;  Surgeon: Loanne DrillingFrank V Aluisio, MD;  Location: [facility];  Service: Orthopedics;  Laterality: Left;  . Cardiac catheterization N/A 01/03/2016    Procedure: Temporary Pacemaker;  Surgeon: Lennette Biharihomas A Kelly, MD;  Location: [facility];  Service: Cardiovascular;  Laterality: N/A;     Current Outpatient Prescriptions  Medication Sig Dispense Refill  . amLODipine (NORVASC) 5 MG tablet Take 1 tablet (5 mg total) by mouth daily. 30 tablet 6  . aspirin EC 81 MG tablet Take 81 mg by mouth daily.    . Cholecalciferol (VITAMIN D) 2000 UNITS tablet Take 2,000 Units by mouth daily.    . furosemide (LASIX) 20 MG tablet Take 1 tablet (20 mg total) by mouth daily. 30 tablet 6  . losartan (COZAAR) 100 MG tablet Take 100 mg by mouth every morning.    . naproxen sodium (ANAPROX) 220 MG tablet Take 440 mg by mouth daily as needed (arthritis).    Marland Kitchen. omeprazole (PRILOSEC) 20 MG capsule Take 20 mg by mouth daily. For heartburn     No current facility-administered medications for this visit.    Allergies:   Codeine; Fentanyl; and Morphine and related  Social History:  The patient  reports that she has never smoked. She has never used smokeless tobacco. She reports that she does not drink alcohol or use illicit drugs.   Family History:  The patient's family history includes CAD in her father; Diabetes in her mother; Heart attack in her father; Hypertension in her father and mother. There is no history of Stroke.    ROS:  Please see the history of present illness.   Otherwise, review of systems are positive for Lightheadedness significantly improved.   All other systems are reviewed and negative.    PHYSICAL EXAM: VS:  BP 110/70 mmHg  Pulse 80  Ht  (1.626 m)  Wt 230 lb (104.327 kg)  BMI 39.46 kg/m2 , BMI Body mass index is 39.46 kg/(m^2). GEN: Well nourished, well developed, in no acute distress HEENT: normal Neck: no JVD, carotid bruits, or masses Cardiac: RRR; no murmurs,  rubs, or gallops,no edema  Respiratory:  clear to auscultation bilaterally, normal work of breathing GI: soft, nontender, nondistended, + BS, obese MS: no deformity or atrophy Skin: warm and dry, no rash Neuro:  Strength and sensation are intact Psych: euthymic mood, full affect    Recent Labs: 01/03/2016: ALT 21; Magnesium 1.6*; TSH 1.093 01/04/2016: BUN 15; Creatinine, Ser 0.94; Hemoglobin 11.6*; Platelets 199; Potassium 3.9; Sodium 141   Lipid Panel No results found for: CHOL, TRIG, HDL, CHOLHDL, VLDL, LDLCALC, LDLDIRECT   Other studies Reviewed: Additional studies/ records that were reviewed today with results demonstrating: 4/17 echo with normal LV and valvular function.   ASSESSMENT AND PLAN:  1. Bradycardia: Resolved off of carvedilol. Continue to avoid rate slowing drugs. No indication for pacemaker at this time.  No further syncope. 2. Hypertension: Blood pressure well controlled. Continue current antihypertensives. 3. I encouraged her to get back to her regular exercise routine as noted below.   Current medicines are reviewed at length with the patient today.  The patient concerns regarding her medicines were addressed.  The following changes have been made:  No change  Labs/ tests ordered today include:  No orders of the defined types were placed in this encounter.    Recommend 150 minutes/week of aerobic exercise Low fat, low carb, high fiber diet recommended  Disposition:   FU in one year   Signed, Lance Muss, MD  01/13/2016 9:17 AM    [facility] [address] [place], [place], [place]  [address] Phone: [phone]; Fax: [phone]

## 2016-01-13 ENCOUNTER — Encounter: Payer: Self-pay | Admitting: Interventional Cardiology

## 2016-01-13 ENCOUNTER — Ambulatory Visit (INDEPENDENT_AMBULATORY_CARE_PROVIDER_SITE_OTHER): Payer: Medicare HMO | Admitting: Interventional Cardiology

## 2016-01-13 VITALS — BP 110/70 | HR 80 | Ht 64.0 in | Wt 230.0 lb

## 2016-01-13 DIAGNOSIS — I10 Essential (primary) hypertension: Secondary | ICD-10-CM

## 2016-01-13 DIAGNOSIS — R55 Syncope and collapse: Secondary | ICD-10-CM

## 2016-01-13 DIAGNOSIS — R001 Bradycardia, unspecified: Secondary | ICD-10-CM | POA: Diagnosis not present

## 2016-01-13 NOTE — Patient Instructions (Signed)

## 2016-02-06 ENCOUNTER — Other Ambulatory Visit: Payer: Self-pay | Admitting: Internal Medicine

## 2016-02-06 DIAGNOSIS — Z1231 Encounter for screening mammogram for malignant neoplasm of breast: Secondary | ICD-10-CM

## 2016-03-16 ENCOUNTER — Ambulatory Visit
Admission: RE | Admit: 2016-03-16 | Discharge: 2016-03-16 | Disposition: A | Discharge status: Home / Self Care | Payer: Medicare HMO | Source: Ambulatory Visit | Attending: Internal Medicine | Admitting: Internal Medicine

## 2016-03-16 DIAGNOSIS — Z1231 Encounter for screening mammogram for malignant neoplasm of breast: Secondary | ICD-10-CM

## 2016-04-17 DIAGNOSIS — G4733 Obstructive sleep apnea (adult) (pediatric): Secondary | ICD-10-CM | POA: Diagnosis not present

## 2016-04-25 DIAGNOSIS — R69 Illness, unspecified: Secondary | ICD-10-CM | POA: Diagnosis not present

## 2016-04-30 ENCOUNTER — Encounter (HOSPITAL_COMMUNITY): Payer: Self-pay | Admitting: Emergency Medicine

## 2016-04-30 ENCOUNTER — Encounter (HOSPITAL_COMMUNITY)
Admission: EM | Disposition: A | Discharge status: Home / Self Care | Payer: Self-pay | Source: Home / Self Care | Attending: Emergency Medicine

## 2016-04-30 ENCOUNTER — Observation Stay (HOSPITAL_COMMUNITY)
Admission: EM | Admit: 2016-04-30 | Discharge: 2016-05-01 | Disposition: A | Discharge status: Home / Self Care | Payer: Medicare HMO | Attending: Cardiology | Admitting: Cardiology

## 2016-04-30 DIAGNOSIS — I110 Hypertensive heart disease with heart failure: Secondary | ICD-10-CM | POA: Diagnosis not present

## 2016-04-30 DIAGNOSIS — K219 Gastro-esophageal reflux disease without esophagitis: Secondary | ICD-10-CM | POA: Insufficient documentation

## 2016-04-30 DIAGNOSIS — Z87442 Personal history of urinary calculi: Secondary | ICD-10-CM | POA: Diagnosis not present

## 2016-04-30 DIAGNOSIS — Z9889 Other specified postprocedural states: Secondary | ICD-10-CM | POA: Diagnosis not present

## 2016-04-30 DIAGNOSIS — Z96652 Presence of left artificial knee joint: Secondary | ICD-10-CM | POA: Diagnosis not present

## 2016-04-30 DIAGNOSIS — Z833 Family history of diabetes mellitus: Secondary | ICD-10-CM | POA: Insufficient documentation

## 2016-04-30 DIAGNOSIS — R55 Syncope and collapse: Secondary | ICD-10-CM | POA: Diagnosis not present

## 2016-04-30 DIAGNOSIS — Z791 Long term (current) use of non-steroidal anti-inflammatories (NSAID): Secondary | ICD-10-CM | POA: Diagnosis not present

## 2016-04-30 DIAGNOSIS — M1990 Unspecified osteoarthritis, unspecified site: Secondary | ICD-10-CM | POA: Diagnosis not present

## 2016-04-30 DIAGNOSIS — Z8249 Family history of ischemic heart disease and other diseases of the circulatory system: Secondary | ICD-10-CM | POA: Insufficient documentation

## 2016-04-30 DIAGNOSIS — R001 Bradycardia, unspecified: Principal | ICD-10-CM | POA: Diagnosis present

## 2016-04-30 DIAGNOSIS — I5032 Chronic diastolic (congestive) heart failure: Secondary | ICD-10-CM | POA: Insufficient documentation

## 2016-04-30 DIAGNOSIS — I441 Atrioventricular block, second degree: Secondary | ICD-10-CM | POA: Diagnosis present

## 2016-04-30 DIAGNOSIS — Z79899 Other long term (current) drug therapy: Secondary | ICD-10-CM | POA: Insufficient documentation

## 2016-04-30 DIAGNOSIS — Z7982 Long term (current) use of aspirin: Secondary | ICD-10-CM | POA: Diagnosis not present

## 2016-04-30 DIAGNOSIS — Z885 Allergy status to narcotic agent status: Secondary | ICD-10-CM | POA: Insufficient documentation

## 2016-04-30 DIAGNOSIS — Z959 Presence of cardiac and vascular implant and graft, unspecified: Secondary | ICD-10-CM

## 2016-04-30 DIAGNOSIS — E890 Postprocedural hypothyroidism: Secondary | ICD-10-CM | POA: Insufficient documentation

## 2016-04-30 DIAGNOSIS — K449 Diaphragmatic hernia without obstruction or gangrene: Secondary | ICD-10-CM | POA: Diagnosis not present

## 2016-04-30 DIAGNOSIS — I452 Bifascicular block: Secondary | ICD-10-CM | POA: Insufficient documentation

## 2016-04-30 DIAGNOSIS — Z90710 Acquired absence of both cervix and uterus: Secondary | ICD-10-CM | POA: Diagnosis not present

## 2016-04-30 DIAGNOSIS — I442 Atrioventricular block, complete: Secondary | ICD-10-CM | POA: Insufficient documentation

## 2016-04-30 DIAGNOSIS — I4510 Unspecified right bundle-branch block: Secondary | ICD-10-CM | POA: Insufficient documentation

## 2016-04-30 HISTORY — PX: EP IMPLANTABLE DEVICE: SHX172B

## 2016-04-30 LAB — BASIC METABOLIC PANEL
ANION GAP: 11 (ref 5–15)
BUN: 23 mg/dL — ABNORMAL HIGH (ref 6–20)
CALCIUM: 9.9 mg/dL (ref 8.9–10.3)
CO2: 24 mmol/L (ref 22–32)
Chloride: 103 mmol/L (ref 101–111)
Creatinine, Ser: 0.91 mg/dL (ref 0.44–1.00)
Glucose, Bld: 142 mg/dL — ABNORMAL HIGH (ref 65–99)
Potassium: 3.5 mmol/L (ref 3.5–5.1)
Sodium: 138 mmol/L (ref 135–145)

## 2016-04-30 LAB — I-STAT TROPONIN, ED: TROPONIN I, POC: 0.01 ng/mL (ref 0.00–0.08)

## 2016-04-30 LAB — CBC WITH DIFFERENTIAL/PLATELET
BASOS ABS: 0 10*3/uL (ref 0.0–0.1)
BASOS PCT: 1 %
Eosinophils Absolute: 0.2 10*3/uL (ref 0.0–0.7)
Eosinophils Relative: 2 %
HEMATOCRIT: 39.9 % (ref 36.0–46.0)
Hemoglobin: 13.2 g/dL (ref 12.0–15.0)
Lymphocytes Relative: 47 %
Lymphs Abs: 3.3 10*3/uL (ref 0.7–4.0)
MCH: 31.1 pg (ref 26.0–34.0)
MCHC: 33.1 g/dL (ref 30.0–36.0)
MCV: 94.1 fL (ref 78.0–100.0)
MONO ABS: 0.5 10*3/uL (ref 0.1–1.0)
Monocytes Relative: 7 %
NEUTROS ABS: 3.1 10*3/uL (ref 1.7–7.7)
NEUTROS PCT: 43 %
Platelets: 218 10*3/uL (ref 150–400)
RBC: 4.24 MIL/uL (ref 3.87–5.11)
RDW: 12.9 % (ref 11.5–15.5)
WBC: 7.1 10*3/uL (ref 4.0–10.5)

## 2016-04-30 LAB — MAGNESIUM: Magnesium: 1.7 mg/dL (ref 1.7–2.4)

## 2016-04-30 SURGERY — PACEMAKER IMPLANT

## 2016-04-30 MED ORDER — IOPAMIDOL (ISOVUE-370) INJECTION 76%
INTRAVENOUS | Status: AC
  Filled 2016-04-30: qty 50

## 2016-04-30 MED ORDER — SODIUM CHLORIDE 0.9% FLUSH: 3.0000 mL | Freq: Two times a day (BID) | INTRAVENOUS | Status: DC

## 2016-04-30 MED ORDER — HEPARIN (PORCINE) IN NACL 2-0.9 UNIT/ML-% IJ SOLN
INTRAMUSCULAR | Status: AC
  Filled 2016-04-30: qty 1000

## 2016-04-30 MED ORDER — LIDOCAINE HCL (PF) 1 % IJ SOLN
INTRAMUSCULAR | Status: AC
  Filled 2016-04-30: qty 60

## 2016-04-30 MED ORDER — CEFAZOLIN IN D5W 1 GM/50ML IV SOLN
1.0000 g | Freq: Four times a day (QID) | INTRAVENOUS | Status: AC
  Administered 2016-04-30 – 2016-05-01 (×3): 1 g via INTRAVENOUS
  Filled 2016-04-30 (×4): qty 50

## 2016-04-30 MED ORDER — SODIUM CHLORIDE 0.9 % IR SOLN
Status: AC
  Filled 2016-04-30: qty 2

## 2016-04-30 MED ORDER — SODIUM CHLORIDE 0.9% FLUSH: 3.0000 mL | INTRAVENOUS | Status: DC | PRN

## 2016-04-30 MED ORDER — LIDOCAINE HCL (PF) 1 % IJ SOLN
INTRAMUSCULAR | Status: DC | PRN
  Administered 2016-04-30: 46 mL via INTRADERMAL

## 2016-04-30 MED ORDER — MIDAZOLAM HCL 5 MG/5ML IJ SOLN
INTRAMUSCULAR | Status: DC | PRN
  Administered 2016-04-30: 1 mg via INTRAVENOUS

## 2016-04-30 MED ORDER — LOSARTAN POTASSIUM 50 MG PO TABS
100.0000 mg | ORAL_TABLET | Freq: Every morning | ORAL | Status: DC
  Administered 2016-05-01: 100 mg via ORAL
  Filled 2016-04-30: qty 2

## 2016-04-30 MED ORDER — SODIUM CHLORIDE 0.9 % IR SOLN
80.0000 mg | Status: AC
  Administered 2016-04-30: 80 mg

## 2016-04-30 MED ORDER — SODIUM CHLORIDE 0.9 % IV SOLN: INTRAVENOUS | Status: DC

## 2016-04-30 MED ORDER — IOPAMIDOL (ISOVUE-370) INJECTION 76%
INTRAVENOUS | Status: DC | PRN
  Administered 2016-04-30: 15 mL via INTRAVENOUS

## 2016-04-30 MED ORDER — FENTANYL CITRATE (PF) 100 MCG/2ML IJ SOLN
INTRAMUSCULAR | Status: AC
  Filled 2016-04-30: qty 2

## 2016-04-30 MED ORDER — PANTOPRAZOLE SODIUM 40 MG PO TBEC
40.0000 mg | DELAYED_RELEASE_TABLET | Freq: Every day | ORAL | Status: DC
  Administered 2016-05-01: 40 mg via ORAL
  Filled 2016-04-30: qty 1

## 2016-04-30 MED ORDER — FENTANYL CITRATE (PF) 100 MCG/2ML IJ SOLN
INTRAMUSCULAR | Status: DC | PRN
  Administered 2016-04-30: 12.5 ug via INTRAVENOUS

## 2016-04-30 MED ORDER — HEPARIN (PORCINE) IN NACL 2-0.9 UNIT/ML-% IJ SOLN
INTRAMUSCULAR | Status: DC | PRN
  Administered 2016-04-30: 500 mL

## 2016-04-30 MED ORDER — ACETAMINOPHEN 325 MG PO TABS: 325.0000 mg | ORAL_TABLET | ORAL | Status: DC | PRN

## 2016-04-30 MED ORDER — CHLORHEXIDINE GLUCONATE 4 % EX LIQD
60.0000 mL | Freq: Once | CUTANEOUS | Status: DC
  Filled 2016-04-30: qty 60

## 2016-04-30 MED ORDER — AMLODIPINE BESYLATE 5 MG PO TABS
5.0000 mg | ORAL_TABLET | Freq: Every day | ORAL | Status: DC
  Administered 2016-04-30 – 2016-05-01 (×2): 5 mg via ORAL
  Filled 2016-04-30 (×2): qty 1

## 2016-04-30 MED ORDER — MIDAZOLAM HCL 5 MG/5ML IJ SOLN
INTRAMUSCULAR | Status: AC
  Filled 2016-04-30: qty 5

## 2016-04-30 MED ORDER — SODIUM CHLORIDE 0.9% FLUSH
3.0000 mL | Freq: Two times a day (BID) | INTRAVENOUS | Status: DC
  Administered 2016-04-30: 3 mL via INTRAVENOUS

## 2016-04-30 MED ORDER — FUROSEMIDE 20 MG PO TABS
20.0000 mg | ORAL_TABLET | Freq: Every day | ORAL | Status: DC
  Administered 2016-05-01: 20 mg via ORAL
  Filled 2016-04-30: qty 1

## 2016-04-30 MED ORDER — SODIUM CHLORIDE 0.9 % IV SOLN: 250.0000 mL | INTRAVENOUS | Status: DC | PRN

## 2016-04-30 MED ORDER — CEFAZOLIN SODIUM-DEXTROSE 2-4 GM/100ML-% IV SOLN
INTRAVENOUS | Status: AC
  Filled 2016-04-30: qty 100

## 2016-04-30 MED ORDER — SODIUM CHLORIDE 0.9 % IV SOLN: 250.0000 mL | INTRAVENOUS | Status: DC

## 2016-04-30 MED ORDER — ONDANSETRON HCL 4 MG/2ML IJ SOLN: 4.0000 mg | Freq: Four times a day (QID) | INTRAMUSCULAR | Status: DC | PRN

## 2016-04-30 MED ORDER — CEFAZOLIN SODIUM-DEXTROSE 2-4 GM/100ML-% IV SOLN
2.0000 g | INTRAVENOUS | Status: AC
  Administered 2016-04-30: 2 g via INTRAVENOUS

## 2016-04-30 SURGICAL SUPPLY — 7 items
CABLE SURGICAL S-101-97-12 (CABLE) ×1 IMPLANT
LEAD CAPSURE NOVUS 5076-58CM (Lead) ×1 IMPLANT
LEAD TENDRIL SDX 2088TC-46CM (Lead) ×1 IMPLANT
PACEMAKER ASSURITY DR-RF (Pacemaker) ×1 IMPLANT
PAD DEFIB LIFELINK (PAD) ×1 IMPLANT
SHEATH CLASSIC 7F (SHEATH) ×2 IMPLANT
TRAY PACEMAKER INSERTION (PACKS) ×1 IMPLANT

## 2016-04-30 NOTE — ED Provider Notes (Signed)
I assumed care of this patient from Dr. Johnn HaiPlloina at 0700.  Please see their note for further details of Hx, PE.  Briefly patient is a 65 y.o. female with a Bradycardia Patient was recently admitted in June for complete heart block which was attributed to beta blockers. The patient did not require a pacemaker at that time. Return today for funny sensation and palpitations.  Plan is to consult cardiology.  Cardiology evaluated the patient in the emergency department and  recommended admission for for pacemaker placement.  She remained hemodynamic stable while in the emergency department and was admitted without competition.   Disposition: Admit  Condition: Stable      Nira ConnPedro Eduardo Cardama, MD 04/30/16 [phone]

## 2016-04-30 NOTE — ED Provider Notes (Signed)
[facility] Provider Note   CSN: [id number] Arrival date & time: 04/30/16  0536     History   Chief Complaint Chief Complaint  Patient presents with  . Bradycardia    HPI Kelly Castaneda is a 65 y.o. female.  Patient presents to the emergency department for evaluation of low heart rate and irregular heart rate. Patient reports that she has a history of same. Patient reports that previously she had problems with low heart rate secondary to beta blockers. She has been off of these medications for some time. Patient reports that she was feeling well until she awakened this morning to go to the bathroom. She reports that it felt like her heart was straining to beat and was regular. She took her pulse and it was 49. She is not expressing any chest pain or shortness of breath.      Past Medical History:  Diagnosis Date  . Arthritis   . Bell's palsy   . Chronic diastolic CHF (congestive heart failure) (HCC)    a. 11/2015 Echo: EF 50-55%, Gr1 DD, mildly dil LA.  Marland Kitchen Complete heart block (HCC)    a. 01/2016 in setting of carevedilol-->req temp wire-->resolved with d/c of beta blocker.  . Essential hypertension   . GERD (gastroesophageal reflux disease)   . H/O hiatal hernia   . Headache(784.0)    migraines  . History of kidney stones   . Hypothyroidism   . Shingles   . Syncope    a. 11/2015 - subsequent event monitoring revealed CHF in setting of beta blocker therapy - resolved with discontinuation of coreg.    Patient Active Problem List   Diagnosis Date Noted  . Complete heart block (HCC) 01/03/2016  . Syncope and collapse 11/28/2015  . Hypothyroidism 11/28/2015  . Congestive heart failure (HCC) 10/02/2013  . Essential hypertension, benign 10/02/2013  . GERD (gastroesophageal reflux disease) 10/02/2013  . Constipation 10/02/2013  . Postoperative anemia due to acute blood loss 09/26/2013  . OA (osteoarthritis) of knee 09/25/2013    Past Surgical History:    Procedure Laterality Date  . ABDOMINAL HYSTERECTOMY    . BACK SURGERY     lumbar/ discectomy  . BREAST SURGERY Right    biopsy with titanium clip placement  . CARDIAC CATHETERIZATION     "years ago"  . CARDIAC CATHETERIZATION N/A 01/03/2016   Procedure: Temporary Pacemaker;  Surgeon: Lennette Bihari, MD;  Location: [facility];  Service: Cardiovascular;  Laterality: N/A;  . CHOLECYSTECTOMY    . DILATION AND CURETTAGE OF UTERUS    . LITHOTRIPSY    . PARATHYROIDECTOMY Right   . TOTAL KNEE ARTHROPLASTY Left 09/25/2013   Procedure: LEFT TOTAL KNEE ARTHROPLASTY;  Surgeon: [name], MD;  Location: [facility];  Service: Orthopedics;  Laterality: Left;  . TUBAL LIGATION      OB History    No data available       Home Medications    Prior to Admission medications   Medication Sig Start Date End Date Taking? Authorizing Provider  amLODipine (NORVASC) 5 MG tablet Take 1 tablet (5 mg total) by mouth daily. 01/05/16   Ok Anis, NP  aspirin EC 81 MG tablet Take 81 mg by mouth daily.    Historical Provider, MD  Cholecalciferol (VITAMIN D) 2000 UNITS tablet Take 2,000 Units by mouth daily.    Historical Provider, MD  furosemide (LASIX) 20 MG tablet Take 1 tablet (20 mg total) by mouth daily. 01/05/16  Ok Anishristopher R Berge, NP  losartan (COZAAR) 100 MG tablet Take 100 mg by mouth every morning. 09/06/13   Corky CraftsJayadeep S Varanasi, MD  naproxen sodium (ANAPROX) 220 MG tablet Take 440 mg by mouth daily as needed (arthritis).    Historical Provider, MD  omeprazole (PRILOSEC) 20 MG capsule Take 20 mg by mouth daily. For heartburn    Historical Provider, MD    Family History Family History  Problem Relation Age of Onset  . Diabetes Mother   . Hypertension Mother   . CAD Father   . Heart attack Father   . Hypertension Father   . Stroke Neg Hx     Social History Social History  Substance Use Topics  . Smoking status: Never Smoker  . Smokeless tobacco: Never Used  . Alcohol use  No     Allergies   Codeine; Fentanyl; and Morphine and related   Review of Systems Review of Systems  Cardiovascular: Positive for palpitations. Negative for chest pain.  All other systems reviewed and are negative.    Physical Exam Updated Vital Signs BP 121/76 (BP Location: Right Arm)   Pulse 76   Temp 98.1 F (36.7 C) (Oral)   Resp 18   Ht 5\' 5"  (1.651 m)   Wt 230 lb (104.3 kg)   SpO2 98%   BMI 38.27 kg/m   Physical Exam  Constitutional: She is oriented to person, place, and time. She appears well-developed and well-nourished. No distress.  HENT:  Head: Normocephalic and atraumatic.  Right Ear: Hearing normal.  Left Ear: Hearing normal.  Nose: Nose normal.  Mouth/Throat: Oropharynx is clear and moist and mucous membranes are normal.  Eyes: Conjunctivae and EOM are normal. Pupils are equal, round, and reactive to light.  Neck: Normal range of motion. Neck supple.  Cardiovascular: S1 normal and S2 normal.  A regularly irregular rhythm present. Exam reveals no gallop and no friction rub.   No murmur heard. Pulmonary/Chest: Effort normal and breath sounds normal. No respiratory distress. She exhibits no tenderness.  Abdominal: Soft. Normal appearance and bowel sounds are normal. There is no hepatosplenomegaly. There is no tenderness. There is no rebound, no guarding, no tenderness at McBurney's point and negative Murphy's sign. No hernia.  Musculoskeletal: Normal range of motion.  Neurological: She is alert and oriented to person, place, and time. She has normal strength. No cranial nerve deficit or sensory deficit. Coordination normal. GCS eye subscore is 4. GCS verbal subscore is 5. GCS motor subscore is 6.  Skin: Skin is warm, dry and intact. No rash noted. No cyanosis.  Psychiatric: She has a normal mood and affect. Her speech is normal and behavior is normal. Thought content normal.  Nursing note and vitals reviewed.    ED Treatments / Results  Labs (all labs  ordered are listed, but only abnormal results are displayed) Labs Reviewed  BASIC METABOLIC PANEL - Abnormal; Notable for the following:       Result Value   Glucose, Bld 142 (*)    BUN 23 (*)    All other components within normal limits  CBC WITH DIFFERENTIAL/PLATELET  MAGNESIUM  I-STAT TROPOININ, ED    EKG  EKG Interpretation  Date/Time:  Thursday April 30 2016 05:51:29 EDT Ventricular Rate:  82 PR Interval:    QRS Duration: 152 QT Interval:  430 QTC Calculation: 503 R Axis:   -47 Text Interpretation:  Sinus arrhythmia RBBB and LAFB Left ventricular hypertrophy Baseline wander in lead(s) V2 Confirmed by [facility]  MD, Cristal Deer [phone]) on 04/30/2016 6:41:10 AM       Radiology No results found.  Procedures Procedures (including critical care time)  Medications Ordered in ED Medications - No data to display   Initial Impression / Assessment and Plan / ED Course  I have reviewed the triage vital signs and the nursing notes.  Pertinent labs & imaging results that were available during my care of the patient were reviewed by me and considered in my medical decision making (see chart for details).  Clinical Course    Patient presents to the emergency department for evaluation of bradycardia and irregular heart rate. Reviewing patient's records reveals that she did have an episode of complete heart block in June of this past year which was blamed on beta blockers. Her beta blockers have been stopped and she has been doing well until tonight. She awakened to go to the bathroom and felt like her heart was beating irregularly and at times going slow. She did have a heartbeat in the 40s. At presentation to the ER she was intermittently in the 30s, but this spontaneously resolved. Heart rate is irregular. Patient having periodic pauses. Rhythm indeterminate - at times tachycardia, other times bradycardia with sinus pause. Concerning for possible sinus node dysfunction,  especially with her recent history. Will ask cardiology to see the patient.  Final Clinical Impressions(s) / ED Diagnoses   Final diagnoses:  Bradycardia    New Prescriptions New Prescriptions   No medications on file     Gilda Crease, MD 04/30/16 [phone]

## 2016-04-30 NOTE — ED Triage Notes (Signed)
Pt in from home, woke up feeling "strange" pt checked her BP and HR, HR was in 40's. Pt has hx of same, had holter monitor and temp pacemaker. A/OX4, denies CP, SOB, dizziness, etc.

## 2016-04-30 NOTE — Interval H&P Note (Signed)
History and Physical Interval Note:  04/30/2016 2:49 PM  The patient has recurrent symptomatic AV block with syncope.  She has chronic bifascicular block suggesting intrinsic conduction system disease. The patient has symptomatic bradycardia.  I would therefore recommend pacemaker implantation at this time.  Risks, benefits, alternatives to pacemaker implantation were discussed in detail with the patient today. The patient understands that the risks include but are not limited to bleeding, infection, pneumothorax, perforation, tamponade, vascular damage, renal failure, MI, stroke, death,  and lead dislodgement and wishes to proceed.    Kelly Castaneda  has presented today for surgery, with the diagnosis of hb  The various methods of treatment have been discussed with the patient and family. After consideration of risks, benefits and other options for treatment, the patient has consented to  Procedure(s): Pacemaker Implant (N/A) as a surgical intervention .  The patient's history has been reviewed, patient examined, no change in status, stable for surgery.  I have reviewed the patient's chart and labs.  Questions were answered to the patient's satisfaction.     Kelly Castaneda

## 2016-04-30 NOTE — Progress Notes (Signed)
Patient arrived to 2W  in no acute distress.  Patient was oriented to unit and room to include call light and phone.  Will continue to monitor.

## 2016-04-30 NOTE — Discharge Summary (Signed)
ELECTROPHYSIOLOGY PROCEDURE DISCHARGE SUMMARY    Patient ID: Kelly Castaneda,  MRN: [id number], DOB/AGE: 1951-03-09 65 y.o.  Admit date: 04/30/2016 Discharge date: 05/01/16  Primary Care Physician: Pearla DubonnetGATES,ROBERT NEVILL, MD  Primary Cardiologist: Dr. Eldridge DaceVaranasi Electrophysiologist: Dr. Elberta Fortisamnitz  Primary Discharge Diagnosis:  1. Symptomatic bradycardia status post pacemaker implantation this admission  Secondary Discharge Diagnosis:  1. HTN  Allergies  Allergen Reactions  . Codeine Nausea And Vomiting  . Fentanyl Nausea And Vomiting  . Morphine And Related Nausea And Vomiting     Procedures This Admission:  1.  Implantation of a STJ dual chamber PPM on 04/30/16 by Dr Johney FrameAllred.  The patient received a [employer] Medical Assurity MRI model [id number] (serial number  [id number]) pacemaker with [facility] [employer] Medical model [id number] (serial number  [id number]) right atrial lead and a Medtronic [id number] (serial number  [id number]) right ventricular lead.  There were no immediate post procedure complications. 2.  CXR on 05/01/16 demonstrated no pneumothorax status post device implantation.   Brief HPI: Kelly SkeensLoretta H Capistran is a 65 y.o. female [facility]/o feeling her heart beat was slow, weak, reminiscent of how she felt a couple months ago when she had CHB, though not quite as severe, she came in and in the ER observed to have 2 episodes of pauses 2 and 4.2 seconds of CHB.  Hospital Course:  She had a prior hospitalization on 01/03/16 noting on a event monitor that she was in CHB, she was symptomatic and EMS was called, found in CHB. She was treated at that time with Isuprel in the ER without improvement requiring transcutaneous pacing and then temp wire was placed, she was on carvedilol that was stopped with resolution of her bradycardia and discharged 01/05/16.  She was again admitted yesterday with similar symptomatology,  meds reviewed, she was on no nodal blocking agents, labs were unremarkable,  she had no associated CP or any other symptoms, and underwent implantation of a PPM with details as outlined above. She was monitored on telemetry overnight which demonstrated SR/ST, we will resume coreg at low dose given ST.  Left chest was without hematoma or ecchymosis.  The device was interrogated and found to be functioning normally.  CXR was obtained and demonstrated no pneumothorax status post device implantation.  Wound care, arm mobility, and restrictions were reviewed with the patient.  The patient was examined by Dr. Johney FrameAllred and considered stable for discharge to home.   Given her hx of syncope (pre-pacer with CHB), now s/p PPM, she is instructed no driving until cleared to do so at her wound check/pacer check visit.   Physical Exam: Vitals:   04/30/16 1810 04/30/16 1831 04/30/16 2031 05/01/16 0401  BP: (!) 130/92 134/82 140/66 125/73  Pulse: 67 71 77 71  Resp: 18 18 18 20   Temp:   98.1 F (36.7 C) 97.5 F (36.4 C)  TempSrc:   Oral Oral  SpO2:   96% 97%  Weight:      Height:        GEN- The patient is well appearing, alert and oriented x 3 today.   HEENT: normocephalic, atraumatic; sclera clear, conjunctiva pink; hearing intact; oropharynx clear; neck supple, no JVP Lungs- Clear to ausculation bilaterally, normal work of breathing.  No wheezes, rales, rhonchi Heart- Regular rate and rhythm, no murmurs, rubs or gallops, PMI not laterally displaced GI- soft, non-tender, non-distended, bowel sounds present, no hepatosplenomegaly Extremities- no clubbing, cyanosis, or edema MS- no significant  deformity or atrophy Skin- warm and dry, no rash or lesion, left chest without hematoma/ecchymosis Psych- euthymic mood, full affect Neuro- no gross deficits   Labs:   Lab Results  Component Value Date   WBC 7.1 04/30/2016   HGB 13.2 04/30/2016   HCT 39.9 04/30/2016   MCV 94.1 04/30/2016   PLT 218 04/30/2016     Recent Labs Lab 05/01/16 0327  NA 139  K 3.6  CL 107  CO2 25    BUN 15  CREATININE 0.72  CALCIUM 9.5  GLUCOSE 104*    Discharge Medications:    Medication List    TAKE these medications   amLODipine 5 MG tablet Commonly known as:  NORVASC Take 1 tablet (5 mg total) by mouth daily.   aspirin EC 81 MG tablet Take 81 mg by mouth daily.   carvedilol 3.125 MG tablet Commonly known as:  COREG Take 1 tablet (3.125 mg total) by mouth 2 (two) times daily with a meal.   furosemide 20 MG tablet Commonly known as:  LASIX Take 1 tablet (20 mg total) by mouth daily.   losartan 100 MG tablet Commonly known as:  COZAAR Take 100 mg by mouth every morning.   naproxen sodium 220 MG tablet Commonly known as:  ANAPROX Take 440 mg by mouth daily as needed (arthritis).   omeprazole 20 MG capsule Commonly known as:  PRILOSEC Take 20 mg by mouth daily. For heartburn   Vitamin D 2000 units tablet Take 2,000 Units by mouth daily.       Disposition:  Home  Discharge Instructions    Diet - low sodium heart healthy    Complete by:  As directed    Increase activity slowly    Complete by:  As directed      Follow-up Information    [facility] Follow up on 05/13/2016.   Specialty:  Cardiology Why:  10:30AM, wound check Contact information: [address], [address] [place] [address] [phone]          Duration of Discharge Encounter: Greater than 30 minutes including physician time.  Signed, Francis Dowse, PA-C 05/01/2016 8:39 AM  I have seen, examined the patient, and reviewed the above assessment and plan.  Changes to above are made where necessary.  Device interrogation is reviewed and normal.  CXR reveals stable leads, no ptx.  Co Sign: Hillis Range, MD 05/01/2016 1:57 PM

## 2016-04-30 NOTE — ED Notes (Signed)
Patient placed on zole with pads on to monitor per MD request.

## 2016-04-30 NOTE — H&P (Signed)
H&P    Patient ID: Kelly Castaneda MRN: [id number], DOB/AGE: [birth year] 65 y.o.  Admit date: 04/30/2016 Date of Admit: 04/30/2016  Primary Physician: Pearla DubonnetGATES,ROBERT NEVILL, MD Primary Cardiologist: Dr. Eldridge DaceVaranasi Electrophysiologist: Dr. Elberta Fortisamnitz  Reason for admission: symptomatic bradycardia, CHB  HPI: Kelly Castaneda is a 65 y.o. female PMHx of hypothyroidism is mentioned in her chart though she denies this, she had hyperparathyroidism and 1-2 parathyroid glands removed a few years back, GERD, HTN with recent history of syncope 11/29/15 she wore wearing a heart monitor for evaluation of this received a  On 01/03/16 was called from the monitoring company that she was in CHB, she was symptomatic and EMS was called, found in CHB.  She was treated with Isuprel in the ER without improvement requiring transcutaneous pacing and then temp wire was placed, she was on carvedilol that was stopped with resolution of her bradycardia and discharged 01/05/16.    She was feeling well until this morning, she woke feeling like her heart beat was unusual, not fast, but different like it was struggling to beat, she felt her pulse in the 40's, and not feeling well in a vague nonspecific way.  She then again felt just as she had with her last admission suddenly sick to her stomach and weak, though resolved, given her recent hx she sought medical attention.  She had no other symptoms, no CP or SOB, no near syncope or syncope.  She has remained off her coreg since June, no other nodal or rate limiting agents.  She is currently asymptomatic being seen in the ER.  No recent illnesses.  LABS: BUN/Creat 23/0.91 K+ 3.5 poc Trop 0.01 H/H 13/39 WBC 7.1 plts 218  01/03/16: TSH 1.093  Past Medical History:  Diagnosis Date  . Arthritis   . Bell's palsy   . Chronic diastolic CHF (congestive heart failure) (HCC)    a. 11/2015 Echo: EF 50-55%, Gr1 DD, mildly dil LA.  Marland Kitchen. Complete heart block (HCC)    a. 01/2016 in setting of  carevedilol-->req temp wire-->resolved with d/c of beta blocker.  . Essential hypertension   . GERD (gastroesophageal reflux disease)   . H/O hiatal hernia   . Headache(784.0)    migraines  . History of kidney stones   . Hypothyroidism   . Shingles   . Syncope    a. 11/2015 - subsequent event monitoring revealed CHF in setting of beta blocker therapy - resolved with discontinuation of coreg.     Surgical History:  Past Surgical History:  Procedure Laterality Date  . ABDOMINAL HYSTERECTOMY    . BACK SURGERY     lumbar/ discectomy  . BREAST SURGERY Right    biopsy with titanium clip placement  . CARDIAC CATHETERIZATION     "years ago"  . CARDIAC CATHETERIZATION N/A 01/03/2016   Procedure: Temporary Pacemaker;  Surgeon: Lennette Biharihomas A Kelly, MD;  Location: [facility];  Service: Cardiovascular;  Laterality: N/A;  . CHOLECYSTECTOMY    . DILATION AND CURETTAGE OF UTERUS    . LITHOTRIPSY    . PARATHYROIDECTOMY Right   . TOTAL KNEE ARTHROPLASTY Left 09/25/2013   Procedure: LEFT TOTAL KNEE ARTHROPLASTY;  Surgeon: Loanne DrillingFrank V Aluisio, MD;  Location: [facility];  Service: Orthopedics;  Laterality: Left;  . TUBAL LIGATION        (Not in a hospital admission)  Inpatient Medications:   Allergies:  Allergies  Allergen Reactions  . Codeine Nausea And Vomiting  . Fentanyl Nausea And Vomiting  .  Morphine And Related Nausea And Vomiting    Social History   Social History  . Marital status: Divorced    Spouse name: N/A  . Number of children: N/A  . Years of education: N/A   Occupational History  . Not on file.   Social History Main Topics  . Smoking status: Never Smoker  . Smokeless tobacco: Never Used  . Alcohol use No  . Drug use: No  . Sexual activity: Not on file   Other Topics Concern  . Not on file   Social History Narrative  . No narrative on file     Family History  Problem Relation Age of Onset  . Diabetes Mother   . Hypertension Mother   . CAD Father   . Heart  attack Father   . Hypertension Father   . Stroke Neg Hx      Review of Systems: All other systems reviewed and are otherwise negative except as noted above.  Physical Exam: Vitals:   04/30/16 0830 04/30/16 0845 04/30/16 0915 04/30/16 0930  BP: 128/57 (!) 134/51 (!) 141/54 109/65  Pulse: 67 63 72 67  Resp: 19 20 17 19   Temp:      TempSrc:      SpO2: 97% 97% 100% 97%  Weight:      Height:        GEN- The patient is well appearing, alert and oriented x 3 today.   HEENT: normocephalic, atraumatic; sclera clear, conjunctiva pink; hearing intact; oropharynx clear; neck supple, no JVP Lymph- no cervical lymphadenopathy Lungs- Clear to ausculation bilaterally, normal work of breathing.  No wheezes, rales, rhonchi Heart- RRR, no murmurs, rubs or gallops, PMI not laterally displaced GI- soft, non-tender, non-distended Extremities- no clubbing, cyanosis, or edema MS- no significant deformity or atrophy Skin- warm and dry, no rash or lesion Psych- euthymic mood, full affect Neuro- no gross deficits observed  Labs:   Lab Results  Component Value Date   WBC 7.1 04/30/2016   HGB 13.2 04/30/2016   HCT 39.9 04/30/2016   MCV 94.1 04/30/2016   PLT 218 04/30/2016    Recent Labs Lab 04/30/16 0600  NA 138  K 3.5  CL 103  CO2 24  BUN 23*  CREATININE 0.91  CALCIUM 9.9  GLUCOSE 142*      Radiology/Studies: No results found.  EKG: SR, RBBB, LAFB TELEMETRY: SR, one 4.2 second episode of CHB/pause, and another shorter episode note  11/29/15: TTE Study Conclusions - Left ventricle: The cavity size was mildly dilated. Wall   thickness was normal. Systolic function was normal. The estimated   ejection fraction was in the range of 50% to 55%. Wall motion was   normal; there were no regional wall motion abnormalities. Doppler   parameters are consistent with abnormal left ventricular   relaxation (grade 1 diastolic dysfunction). - Left atrium: The atrium was mildly  dilated.  Assessment and Plan:   1. Symptomatic bradycardia, pauses, transient CHB      No reversible causes found      Dr. Elberta Fortis discussed with the patient PPM implant, risks, benefits, she would like to proceed      We Tonisha Silvey put her on the scheduled today as it allows      BP stable      Admit to telemetry  2. HTN     stable    Signed, Francis Dowse, PA-C 04/30/2016 9:52 AM   I have seen and examined this patient with Francis Dowse.  Agree with above, note added to reflect my findings.  On exam, regular rhythm no murmurs, lungs clear. Presented with fatigue and lightheadedness found to have episodes of complete AV block on her monitor.  Presented in June with similar symptoms and had beta blockers stopped.  Plan for pacemaker placement.  Risks and benefits discussed.  Risks include but are not limited to bleeding, infection, tamponade, pneumothorax.  The patient understands these risks and has agreed to the procedure.    Tijana Walder M. Mari Battaglia MD 04/30/2016 11:37 AM

## 2016-05-01 ENCOUNTER — Ambulatory Visit (HOSPITAL_COMMUNITY): Payer: Medicare HMO

## 2016-05-01 ENCOUNTER — Encounter (HOSPITAL_COMMUNITY): Payer: Self-pay | Admitting: Internal Medicine

## 2016-05-01 ENCOUNTER — Encounter (HOSPITAL_COMMUNITY)
Admission: EM | Disposition: A | Discharge status: Home / Self Care | Payer: Self-pay | Source: Home / Self Care | Attending: Emergency Medicine

## 2016-05-01 DIAGNOSIS — R55 Syncope and collapse: Secondary | ICD-10-CM | POA: Diagnosis not present

## 2016-05-01 DIAGNOSIS — I441 Atrioventricular block, second degree: Secondary | ICD-10-CM | POA: Diagnosis not present

## 2016-05-01 DIAGNOSIS — Z952 Presence of prosthetic heart valve: Secondary | ICD-10-CM | POA: Diagnosis not present

## 2016-05-01 DIAGNOSIS — I5032 Chronic diastolic (congestive) heart failure: Secondary | ICD-10-CM | POA: Diagnosis not present

## 2016-05-01 DIAGNOSIS — K219 Gastro-esophageal reflux disease without esophagitis: Secondary | ICD-10-CM | POA: Diagnosis not present

## 2016-05-01 DIAGNOSIS — Z885 Allergy status to narcotic agent status: Secondary | ICD-10-CM | POA: Diagnosis not present

## 2016-05-01 DIAGNOSIS — R001 Bradycardia, unspecified: Secondary | ICD-10-CM | POA: Diagnosis not present

## 2016-05-01 DIAGNOSIS — I110 Hypertensive heart disease with heart failure: Secondary | ICD-10-CM | POA: Diagnosis not present

## 2016-05-01 DIAGNOSIS — E890 Postprocedural hypothyroidism: Secondary | ICD-10-CM | POA: Diagnosis not present

## 2016-05-01 DIAGNOSIS — M1990 Unspecified osteoarthritis, unspecified site: Secondary | ICD-10-CM | POA: Diagnosis not present

## 2016-05-01 DIAGNOSIS — I442 Atrioventricular block, complete: Secondary | ICD-10-CM | POA: Diagnosis not present

## 2016-05-01 LAB — BASIC METABOLIC PANEL
ANION GAP: 7 (ref 5–15)
BUN: 15 mg/dL (ref 6–20)
CO2: 25 mmol/L (ref 22–32)
Calcium: 9.5 mg/dL (ref 8.9–10.3)
Chloride: 107 mmol/L (ref 101–111)
Creatinine, Ser: 0.72 mg/dL (ref 0.44–1.00)
GFR calc Af Amer: >60 mL/min (ref >60)
Glucose, Bld: 104 mg/dL — ABNORMAL HIGH (ref 65–99)
POTASSIUM: 3.6 mmol/L (ref 3.5–5.1)
SODIUM: 139 mmol/L (ref 135–145)

## 2016-05-01 SURGERY — PACEMAKER IMPLANT
Anesthesia: LOCAL

## 2016-05-01 MED ORDER — CARVEDILOL 3.125 MG PO TABS: 3.1250 mg | ORAL_TABLET | Freq: Two times a day (BID) | ORAL | 3 refills | Status: DC

## 2016-05-01 NOTE — Discharge Instructions (Signed)
° ° °  Supplemental Discharge Instructions for  Pacemaker/Defibrillator Patients  Activity No heavy lifting or vigorous activity with your left/right arm for 6 to 8 weeks.  Do not raise your left/right arm above your head for one week.  Gradually raise your affected arm as drawn below.             05/04/16                     05/05/16                    05/06/16                   05/07/16 __  NO DRIVING until cleared to at your wound check visit WOUND CARE - Keep the wound area clean and dry.  Do not get this area wet for one week. No showers for one week; you may shower on 05/07/16    . - The tape/steri-strips on your wound will fall off; do not pull them off.  No bandage is needed on the site.  DO  NOT apply any creams, oils, or ointments to the wound area. - If you notice any drainage or discharge from the wound, any swelling or bruising at the site, or you develop a fever > 101? F after you are discharged home, call the office at once.  Special Instructions - You are still able to use cellular telephones; use the ear opposite the side where you have your pacemaker/defibrillator.  Avoid carrying your cellular phone near your device. - When traveling through airports, show security personnel your identification card to avoid being screened in the metal detectors.  Ask the security personnel to use the hand wand. - Avoid arc welding equipment, MRI testing (magnetic resonance imaging), TENS units (transcutaneous nerve stimulators).  Call the office for questions about other devices. - Avoid electrical appliances that are in poor condition or are not properly grounded. - Microwave ovens are safe to be near or to operate.  Additional information for defibrillator patients should your device go off: - If your device goes off ONCE and you feel fine afterward, notify the device clinic nurses. - If your device goes off ONCE and you do not feel well afterward, call 911. - If your device goes off TWICE,  call 911. - If your device goes off THREE times in one day, call 911.  DO NOT DRIVE YOURSELF OR A FAMILY MEMBER WITH A DEFIBRILLATOR TO THE HOSPITAL--CALL 911.

## 2016-05-13 ENCOUNTER — Encounter: Payer: Self-pay | Admitting: Internal Medicine

## 2016-05-13 ENCOUNTER — Ambulatory Visit (INDEPENDENT_AMBULATORY_CARE_PROVIDER_SITE_OTHER): Payer: Medicare HMO | Admitting: *Deleted

## 2016-05-13 DIAGNOSIS — I442 Atrioventricular block, complete: Secondary | ICD-10-CM | POA: Diagnosis not present

## 2016-05-13 NOTE — Progress Notes (Signed)
Wound check appointment. Steri-strips removed prior to wound check, per pt they fell off. Wound without redness or edema. Lateral left incision edges superficially un-approximated, 2 Steri-Strips applied.  wound well healed. Normal device function. Thresholds, sensing, and impedances consistent with implant measurements. Device programmed at 3.5V for extra safety margin until 3 month visit. Histogram distribution appropriate for patient and level of activity. 1 VHR, 1:1 conduction peak V 160bpm 18 sec long. 3 AMS peak, EGMS AT, peak A 171bpm, longest 17sec.  Patient educated about wound care, arm mobility, lifting restrictions, shock plan. ROV with device clinic 10/18 for wound re-check. ROV with JA 08/07/2016.

## 2016-05-20 ENCOUNTER — Ambulatory Visit (INDEPENDENT_AMBULATORY_CARE_PROVIDER_SITE_OTHER): Payer: Medicare HMO | Admitting: *Deleted

## 2016-05-20 DIAGNOSIS — Z959 Presence of cardiac and vascular implant and graft, unspecified: Secondary | ICD-10-CM

## 2016-05-20 NOTE — Progress Notes (Signed)
Wound re-check steri-strips removed, incision edges well approximated, wound without redness, swelling. Pt educated and wound care and to call if site becomes red, swollen or develops fever and chills. Pt voiced understanding.

## 2016-06-10 DIAGNOSIS — Z0001 Encounter for general adult medical examination with abnormal findings: Secondary | ICD-10-CM | POA: Diagnosis not present

## 2016-06-10 DIAGNOSIS — E559 Vitamin D deficiency, unspecified: Secondary | ICD-10-CM | POA: Diagnosis not present

## 2016-06-10 DIAGNOSIS — Z79899 Other long term (current) drug therapy: Secondary | ICD-10-CM | POA: Diagnosis not present

## 2016-06-10 DIAGNOSIS — Z1389 Encounter for screening for other disorder: Secondary | ICD-10-CM | POA: Diagnosis not present

## 2016-06-10 DIAGNOSIS — I420 Dilated cardiomyopathy: Secondary | ICD-10-CM | POA: Diagnosis not present

## 2016-06-10 DIAGNOSIS — E669 Obesity, unspecified: Secondary | ICD-10-CM | POA: Diagnosis not present

## 2016-06-10 DIAGNOSIS — K219 Gastro-esophageal reflux disease without esophagitis: Secondary | ICD-10-CM | POA: Diagnosis not present

## 2016-06-10 DIAGNOSIS — Z7189 Other specified counseling: Secondary | ICD-10-CM | POA: Diagnosis not present

## 2016-06-10 DIAGNOSIS — I10 Essential (primary) hypertension: Secondary | ICD-10-CM | POA: Diagnosis not present

## 2016-06-10 DIAGNOSIS — R55 Syncope and collapse: Secondary | ICD-10-CM | POA: Diagnosis not present

## 2016-06-10 DIAGNOSIS — Z950 Presence of cardiac pacemaker: Secondary | ICD-10-CM | POA: Diagnosis not present

## 2016-08-07 ENCOUNTER — Ambulatory Visit (INDEPENDENT_AMBULATORY_CARE_PROVIDER_SITE_OTHER): Payer: Medicare HMO | Admitting: Internal Medicine

## 2016-08-07 ENCOUNTER — Encounter: Payer: Self-pay | Admitting: Internal Medicine

## 2016-08-07 VITALS — BP 136/76 | HR 68 | Ht 65.0 in | Wt 239.0 lb

## 2016-08-07 DIAGNOSIS — I441 Atrioventricular block, second degree: Secondary | ICD-10-CM | POA: Diagnosis not present

## 2016-08-07 DIAGNOSIS — I10 Essential (primary) hypertension: Secondary | ICD-10-CM

## 2016-08-07 DIAGNOSIS — I442 Atrioventricular block, complete: Secondary | ICD-10-CM | POA: Diagnosis not present

## 2016-08-07 LAB — CUP PACEART INCLINIC DEVICE CHECK
Battery Voltage: 3.01 V
Brady Statistic RV Percent Paced: 3.7 %
Date Time Interrogation Session: 20180105173319
Implantable Lead Implant Date: 20170928
Implantable Lead Location: 753859
Implantable Lead Model: 5076
Lead Channel Impedance Value: 380 Ohm
Lead Channel Pacing Threshold Amplitude: 1 V
Lead Channel Pacing Threshold Pulse Width: 0.4 ms
Lead Channel Sensing Intrinsic Amplitude: 5 mV
Lead Channel Sensing Intrinsic Amplitude: 7.3 mV
Lead Channel Setting Pacing Amplitude: 2 V
Lead Channel Setting Sensing Sensitivity: 2 mV
MDC IDC LEAD IMPLANT DT: 20170928
MDC IDC LEAD LOCATION: 753860
MDC IDC MSMT LEADCHNL RA IMPEDANCE VALUE: 550 Ohm
MDC IDC MSMT LEADCHNL RA PACING THRESHOLD AMPLITUDE: 0.75 V
MDC IDC MSMT LEADCHNL RA PACING THRESHOLD PULSEWIDTH: 0.4 ms
MDC IDC PG IMPLANT DT: 20170928
MDC IDC PG SERIAL: 7950948
MDC IDC SET LEADCHNL RV PACING AMPLITUDE: 2.5 V
MDC IDC SET LEADCHNL RV PACING PULSEWIDTH: 0.4 ms
MDC IDC STAT BRADY RA PERCENT PACED: 18 %

## 2016-08-07 MED ORDER — CARVEDILOL 3.125 MG PO TABS: 3.1250 mg | ORAL_TABLET | Freq: Two times a day (BID) | ORAL | 3 refills | Status: AC

## 2016-08-07 NOTE — Patient Instructions (Addendum)
Medication Instructions:  Your physician recommends that you continue on your current medications as directed. Please refer to the Current Medication list given to you today.   Labwork: None ordered   Testing/Procedures: None ordered   Follow-Up: Remote monitoring is used to monitor your Pacemaker  from home. This monitoring reduces the number of office visits required to check your device to one time per year. It allows us to keep an eye on the functioning of your device to ensure it is working properly. You are scheduled for a device check from home on 11/05/16. You may send your transmission at any time that day. If you have a wireless device, the transmission will be sent automatically. After your physician reviews your transmission, you will receive a postcard with your next transmission date.  Your physician wants you to follow-up in: 12 months with Gypsy BalsamAmber Seiler, NP You will receive a reminder letter in the mail two months in advance. If you don't receive a letter, please call our office to schedule the follow-up appointment.     Any Other Special Instructions Will Be Listed Below (If Applicable).     If you need a refill on your cardiac medications before your next appointment, please call your pharmacy.

## 2016-08-07 NOTE — Progress Notes (Signed)
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  Since her recent PPM implant, the patient reports doing very well.  She denies procedure related complications and is pleased with results.  Her syncope has resolved.  Today, she denies symptoms of palpitations, chest pain, shortness of breath,  lower extremity edema, or dizziness.  The patient is otherwise without complaint today.   Past Medical History:  Diagnosis Date  . Arthritis   . Bell's palsy   . Chronic diastolic CHF (congestive heart failure) (HCC)    a. 11/2015 Echo: EF 50-55%, Gr1 DD, mildly dil LA.  Marland Kitchen Complete heart block (HCC)    mobitz II second degree AV  . Essential hypertension   . GERD (gastroesophageal reflux disease)   . H/O hiatal hernia   . Headache(784.0)    migraines  . History of kidney stones   . Hypothyroidism   . Shingles   . Syncope    a. 11/2015 - subsequent event monitoring revealed CHF in setting of beta blocker therapy - resolved with discontinuation of coreg.   Past Surgical History:  Procedure Laterality Date  . ABDOMINAL HYSTERECTOMY    . BACK SURGERY     lumbar/ discectomy  . BREAST SURGERY Right    biopsy with titanium clip placement  . CARDIAC CATHETERIZATION     "years ago"  . CARDIAC CATHETERIZATION N/A 01/03/2016   Procedure: Temporary Pacemaker;  Surgeon: Lennette Bihari, MD;  Location: [facility];  Service: Cardiovascular;  Laterality: N/A;  . CHOLECYSTECTOMY    . DILATION AND CURETTAGE OF UTERUS    . EP IMPLANTABLE DEVICE N/A 04/30/2016   [employer] MRI PPM implanted for transient complete heart block by Dr Johney Frame  . LITHOTRIPSY    . PARATHYROIDECTOMY Right   . TOTAL KNEE ARTHROPLASTY Left 09/25/2013   Procedure: LEFT TOTAL KNEE ARTHROPLASTY;  Surgeon: [name], MD;  Location: [facility];  Service: Orthopedics;  Laterality: Left;  . TUBAL LIGATION      ROS- all  systems are reviewed and negative except as per HPI above  Current Outpatient Prescriptions  Medication Sig Dispense Refill  . amLODipine (NORVASC) 5 MG tablet Take 1 tablet (5 mg total) by mouth daily. 30 tablet 6  . aspirin EC 81 MG tablet Take 81 mg by mouth daily.    . carvedilol (COREG) 3.125 MG tablet Take 1 tablet (3.125 mg total) by mouth 2 (two) times daily with a meal. 180 tablet 3  . Cholecalciferol (VITAMIN D) 2000 UNITS tablet Take 2,000 Units by mouth daily.    . furosemide (LASIX) 20 MG tablet Take 1 tablet (20 mg total) by mouth daily. 30 tablet 6  . losartan (COZAAR) 100 MG tablet Take 100 mg by mouth every morning.    . naproxen sodium (ANAPROX) 220 MG tablet Take 440 mg by mouth daily as needed (arthritis).    Marland Kitchen omeprazole (PRILOSEC) 20 MG capsule Take 20 mg by mouth daily. For heartburn     No current facility-administered medications for this visit.     Physical Exam: Vitals:   08/07/16 1427  BP: 136/76  Pulse: 68  Weight: 239 lb (108.4 kg)  Height: 5\' 5"  (1.651 m)    GEN- The patient is well appearing, alert and oriented x 3 today.   Head- normocephalic, atraumatic Eyes-  Sclera clear, conjunctiva pink Ears- hearing intact Oropharynx- clear Lungs- Clear to  ausculation bilaterally, normal work of breathing Chest- pacemaker pocket is well healed Heart- Regular rate and rhythm, no murmurs, rubs or gallops, PMI not laterally displaced GI- soft, NT, ND, + BS Extremities- no clubbing, cyanosis, or edema  Pacemaker interrogation- reviewed in detail today,  See PACEART report  ekg today reveals sinus rhythm with bifascicular block  Assessment and Plan:  1. Mobitz II second degree AV block with syncope Normal pacemaker function See Pace Art report VIP turned on today  2. HTN Stable No change required today  3. Chronic diastolic dysfunction Stable No change required today  Merlin  Return to see EP NP in 1 year  Hillis RangeJames Nikkita Adeyemi MD,  [facility] Hospital[facility] 08/07/2016 3:07 PM

## 2016-11-05 ENCOUNTER — Telehealth: Payer: Self-pay | Admitting: Nurse Practitioner

## 2016-11-05 ENCOUNTER — Ambulatory Visit (INDEPENDENT_AMBULATORY_CARE_PROVIDER_SITE_OTHER): Payer: Medicare HMO | Admitting: *Deleted

## 2016-11-05 DIAGNOSIS — T82110A Breakdown (mechanical) of cardiac electrode, initial encounter: Secondary | ICD-10-CM

## 2016-11-05 DIAGNOSIS — I442 Atrioventricular block, complete: Secondary | ICD-10-CM

## 2016-11-05 LAB — CUP PACEART REMOTE DEVICE CHECK
Battery Remaining Longevity: 117 mo
Battery Voltage: 3.01 V
Brady Statistic AP VP Percent: 9.9 %
Brady Statistic RA Percent Paced: 27 %
Implantable Lead Implant Date: 20170928
Implantable Lead Location: 753859
Implantable Lead Model: 5076
Implantable Pulse Generator Implant Date: 20170928
Lead Channel Impedance Value: 430 Ohm
Lead Channel Pacing Threshold Amplitude: 0.75 V
Lead Channel Pacing Threshold Pulse Width: 0.4 ms
Lead Channel Setting Pacing Amplitude: 2 V
Lead Channel Setting Pacing Pulse Width: 0.4 ms
Lead Channel Setting Sensing Sensitivity: 2 mV
MDC IDC LEAD IMPLANT DT: 20170928
MDC IDC LEAD LOCATION: 753860
MDC IDC MSMT BATTERY REMAINING PERCENTAGE: 95.5 %
MDC IDC MSMT LEADCHNL RA IMPEDANCE VALUE: 440 Ohm
MDC IDC MSMT LEADCHNL RA SENSING INTR AMPL: 5 mV
MDC IDC MSMT LEADCHNL RV PACING THRESHOLD AMPLITUDE: 1 V
MDC IDC MSMT LEADCHNL RV PACING THRESHOLD PULSEWIDTH: 0.4 ms
MDC IDC MSMT LEADCHNL RV SENSING INTR AMPL: 2.9 mV
MDC IDC SESS DTM: 20180405082843
MDC IDC SET LEADCHNL RV PACING AMPLITUDE: 2.5 V
MDC IDC STAT BRADY AP VS PERCENT: 18 %
MDC IDC STAT BRADY AS VP PERCENT: 21 %
MDC IDC STAT BRADY AS VS PERCENT: 51 %
MDC IDC STAT BRADY RV PERCENT PACED: 30 %
Pulse Gen Model: 2272
Pulse Gen Serial Number: 7950948

## 2016-11-05 NOTE — Progress Notes (Signed)
Remote pacemaker transmission.   

## 2016-11-05 NOTE — Telephone Encounter (Signed)
Acie Fredrickson, RN called about abnormal remote transmission sent today.  R waves decreased and presenting EGM with RV likely sensing atrium.  Spoke with patient. She has been doing well without dizziness/syncope.  She has not had trauma/device movement.   Discussed with Dr Johney Frame and will plan CXR and likely RV lead revision tomorrow.  I worry about waiting longer than that with prior intermittent complete heart block.  I have spoken with cath lab who can accommodate patient tomorrow for procedure. Dr Johney Frame will see patient early am in short stay after stat CXR and review results with patient.  If RV lead dislodged, will plan for lead revision tomorrow morning.    Short stay and industry aware of patient's planned case for tomorrow and concerns for lead dislodgement.  Instructions reviewed with patient.  ER precautions given. Orders entered.   Gypsy Balsam, NP 11/05/2016 5:01 PM

## 2016-11-05 NOTE — Addendum Note (Signed)
Addended by: Bethanie Dicker on: 11/05/2016 03:50 PM   Modules accepted: Orders

## 2016-11-06 ENCOUNTER — Encounter (HOSPITAL_COMMUNITY): Payer: Self-pay | Admitting: General Practice

## 2016-11-06 ENCOUNTER — Ambulatory Visit (HOSPITAL_COMMUNITY): Payer: Medicare HMO

## 2016-11-06 ENCOUNTER — Ambulatory Visit (HOSPITAL_COMMUNITY)
Admission: RE | Admit: 2016-11-06 | Discharge: 2016-11-06 | Disposition: A | Discharge status: Home / Self Care | Payer: Medicare HMO | Source: Ambulatory Visit | Attending: Internal Medicine | Admitting: Internal Medicine

## 2016-11-06 ENCOUNTER — Encounter (HOSPITAL_COMMUNITY)
Admission: RE | Disposition: A | Discharge status: Home / Self Care | Payer: Self-pay | Source: Ambulatory Visit | Attending: Internal Medicine

## 2016-11-06 ENCOUNTER — Encounter: Payer: Self-pay | Admitting: Cardiology

## 2016-11-06 DIAGNOSIS — E039 Hypothyroidism, unspecified: Secondary | ICD-10-CM | POA: Insufficient documentation

## 2016-11-06 DIAGNOSIS — Z950 Presence of cardiac pacemaker: Secondary | ICD-10-CM | POA: Diagnosis not present

## 2016-11-06 DIAGNOSIS — Y713 Surgical instruments, materials and cardiovascular devices (including sutures) associated with adverse incidents: Secondary | ICD-10-CM | POA: Diagnosis not present

## 2016-11-06 DIAGNOSIS — K219 Gastro-esophageal reflux disease without esophagitis: Secondary | ICD-10-CM | POA: Insufficient documentation

## 2016-11-06 DIAGNOSIS — T82120A Displacement of cardiac electrode, initial encounter: Secondary | ICD-10-CM | POA: Diagnosis not present

## 2016-11-06 DIAGNOSIS — Z7982 Long term (current) use of aspirin: Secondary | ICD-10-CM | POA: Insufficient documentation

## 2016-11-06 DIAGNOSIS — I110 Hypertensive heart disease with heart failure: Secondary | ICD-10-CM | POA: Diagnosis not present

## 2016-11-06 DIAGNOSIS — I441 Atrioventricular block, second degree: Secondary | ICD-10-CM | POA: Diagnosis present

## 2016-11-06 DIAGNOSIS — T82110A Breakdown (mechanical) of cardiac electrode, initial encounter: Secondary | ICD-10-CM

## 2016-11-06 DIAGNOSIS — I5032 Chronic diastolic (congestive) heart failure: Secondary | ICD-10-CM | POA: Diagnosis not present

## 2016-11-06 DIAGNOSIS — I442 Atrioventricular block, complete: Secondary | ICD-10-CM | POA: Insufficient documentation

## 2016-11-06 DIAGNOSIS — Z959 Presence of cardiac and vascular implant and graft, unspecified: Secondary | ICD-10-CM

## 2016-11-06 HISTORY — DX: Obstructive sleep apnea (adult) (pediatric): Z99.89

## 2016-11-06 HISTORY — DX: Family history of other specified conditions: Z84.89

## 2016-11-06 HISTORY — PX: LEAD REVISION: SHX5945

## 2016-11-06 HISTORY — DX: Migraine, unspecified, not intractable, without status migrainosus: G43.909

## 2016-11-06 HISTORY — PX: LEAD REVISION/REPAIR: EP1213

## 2016-11-06 HISTORY — DX: Obstructive sleep apnea (adult) (pediatric): G47.33

## 2016-11-06 HISTORY — DX: Presence of cardiac pacemaker: Z95.0

## 2016-11-06 LAB — CBC
HCT: 39.4 % (ref 36.0–46.0)
Hemoglobin: 13 g/dL (ref 12.0–15.0)
MCH: 30.7 pg (ref 26.0–34.0)
MCHC: 33 g/dL (ref 30.0–36.0)
MCV: 92.9 fL (ref 78.0–100.0)
PLATELETS: 224 10*3/uL (ref 150–400)
RBC: 4.24 MIL/uL (ref 3.87–5.11)
RDW: 13.3 % (ref 11.5–15.5)
WBC: 5.8 10*3/uL (ref 4.0–10.5)

## 2016-11-06 LAB — BASIC METABOLIC PANEL
Anion gap: 10 (ref 5–15)
BUN: 15 mg/dL (ref 6–20)
CALCIUM: 9.5 mg/dL (ref 8.9–10.3)
CO2: 25 mmol/L (ref 22–32)
CREATININE: 0.75 mg/dL (ref 0.44–1.00)
Chloride: 104 mmol/L (ref 101–111)
GLUCOSE: 107 mg/dL — AB (ref 65–99)
Potassium: 4.1 mmol/L (ref 3.5–5.1)
Sodium: 139 mmol/L (ref 135–145)

## 2016-11-06 LAB — SURGICAL PCR SCREEN
MRSA, PCR: NEGATIVE
STAPHYLOCOCCUS AUREUS: NEGATIVE

## 2016-11-06 SURGERY — LEAD REVISION/REPAIR

## 2016-11-06 MED ORDER — LIDOCAINE HCL (PF) 1 % IJ SOLN
INTRAMUSCULAR | Status: DC | PRN
  Administered 2016-11-06: 45 mL

## 2016-11-06 MED ORDER — CARVEDILOL 3.125 MG PO TABS
3.1250 mg | ORAL_TABLET | Freq: Two times a day (BID) | ORAL | Status: DC
  Administered 2016-11-06: 10:00:00 3.125 mg via ORAL
  Filled 2016-11-06: qty 1

## 2016-11-06 MED ORDER — LOSARTAN POTASSIUM 50 MG PO TABS
100.0000 mg | ORAL_TABLET | Freq: Every morning | ORAL | Status: DC
Start: 2016-11-07 — End: 2016-11-06
  Administered 2016-11-06: 100 mg via ORAL
  Filled 2016-11-06: qty 2

## 2016-11-06 MED ORDER — AMLODIPINE BESYLATE 5 MG PO TABS
5.0000 mg | ORAL_TABLET | Freq: Every day | ORAL | Status: DC
  Administered 2016-11-06 (×2): 5 mg via ORAL
  Filled 2016-11-06 (×2): qty 1

## 2016-11-06 MED ORDER — MIDAZOLAM HCL 5 MG/5ML IJ SOLN
INTRAMUSCULAR | Status: AC
  Filled 2016-11-06: qty 5

## 2016-11-06 MED ORDER — ACETAMINOPHEN 325 MG PO TABS
325.0000 mg | ORAL_TABLET | ORAL | Status: DC | PRN
  Administered 2016-11-06: 10:00:00 650 mg via ORAL
  Filled 2016-11-06: qty 2

## 2016-11-06 MED ORDER — CEFAZOLIN SODIUM-DEXTROSE 2-4 GM/100ML-% IV SOLN
INTRAVENOUS | Status: AC
  Filled 2016-11-06: qty 100

## 2016-11-06 MED ORDER — SODIUM CHLORIDE 0.9 % IR SOLN
Status: AC
  Filled 2016-11-06: qty 2

## 2016-11-06 MED ORDER — FUROSEMIDE 20 MG PO TABS
20.0000 mg | ORAL_TABLET | Freq: Every day | ORAL | Status: DC
  Administered 2016-11-06: 20 mg via ORAL
  Filled 2016-11-06: qty 1

## 2016-11-06 MED ORDER — SODIUM CHLORIDE 0.9% FLUSH: 3.0000 mL | INTRAVENOUS | Status: DC | PRN

## 2016-11-06 MED ORDER — HYDROCODONE-ACETAMINOPHEN 5-325 MG PO TABS
1.0000 | ORAL_TABLET | ORAL | Status: DC | PRN
  Administered 2016-11-06: 12:00:00 1 via ORAL
  Filled 2016-11-06: qty 1

## 2016-11-06 MED ORDER — MUPIROCIN 2 % EX OINT
1.0000 "application " | TOPICAL_OINTMENT | Freq: Once | CUTANEOUS | Status: AC
  Administered 2016-11-06: 1 via TOPICAL
  Filled 2016-11-06: qty 22

## 2016-11-06 MED ORDER — GENTAMICIN SULFATE 40 MG/ML IJ SOLN
80.0000 mg | INTRAMUSCULAR | Status: AC
  Administered 2016-11-06: 80 mg
  Filled 2016-11-06: qty 2

## 2016-11-06 MED ORDER — SODIUM CHLORIDE 0.9 % IV SOLN: INTRAVENOUS | Status: DC

## 2016-11-06 MED ORDER — HEPARIN (PORCINE) IN NACL 2-0.9 UNIT/ML-% IJ SOLN
INTRAMUSCULAR | Status: AC
  Filled 2016-11-06: qty 500

## 2016-11-06 MED ORDER — SODIUM CHLORIDE 0.9% FLUSH
3.0000 mL | Freq: Two times a day (BID) | INTRAVENOUS | Status: DC
  Administered 2016-11-06: 3 mL via INTRAVENOUS

## 2016-11-06 MED ORDER — CEFAZOLIN IN D5W 1 GM/50ML IV SOLN
1.0000 g | Freq: Four times a day (QID) | INTRAVENOUS | Status: DC
  Filled 2016-11-06 (×3): qty 50

## 2016-11-06 MED ORDER — CHLORHEXIDINE GLUCONATE 4 % EX LIQD
60.0000 mL | Freq: Once | CUTANEOUS | Status: DC
  Filled 2016-11-06: qty 60

## 2016-11-06 MED ORDER — HEPARIN (PORCINE) IN NACL 2-0.9 UNIT/ML-% IJ SOLN
INTRAMUSCULAR | Status: DC | PRN
Start: 2016-11-06 — End: 2016-11-06
  Administered 2016-11-06: 08:00:00

## 2016-11-06 MED ORDER — SODIUM CHLORIDE 0.9 % IV SOLN: 250.0000 mL | INTRAVENOUS | Status: DC | PRN

## 2016-11-06 MED ORDER — MUPIROCIN 2 % EX OINT
TOPICAL_OINTMENT | CUTANEOUS | Status: AC
  Administered 2016-11-06: 1 via TOPICAL
  Filled 2016-11-06: qty 22

## 2016-11-06 MED ORDER — CEFAZOLIN SODIUM-DEXTROSE 2-4 GM/100ML-% IV SOLN
2.0000 g | INTRAVENOUS | Status: AC
  Administered 2016-11-06: 2 g via INTRAVENOUS
  Filled 2016-11-06: qty 100

## 2016-11-06 MED ORDER — SODIUM CHLORIDE 0.9 % IV SOLN
INTRAVENOUS | Status: DC
  Administered 2016-11-06: 07:00:00 via INTRAVENOUS

## 2016-11-06 MED ORDER — MIDAZOLAM HCL 5 MG/5ML IJ SOLN
INTRAMUSCULAR | Status: DC | PRN
  Administered 2016-11-06 (×3): 1 mg via INTRAVENOUS

## 2016-11-06 MED ORDER — LIDOCAINE HCL (PF) 1 % IJ SOLN
INTRAMUSCULAR | Status: AC
  Filled 2016-11-06: qty 60

## 2016-11-06 MED ORDER — ONDANSETRON HCL 4 MG/2ML IJ SOLN: 4.0000 mg | Freq: Four times a day (QID) | INTRAMUSCULAR | Status: DC | PRN

## 2016-11-06 SURGICAL SUPPLY — 7 items
CABLE SURGICAL S-101-97-12 (CABLE) ×1 IMPLANT
LEAD CAPSURE NOVUS 5076-58CM (Lead) ×1 IMPLANT
PAD DEFIB LIFELINK (PAD) ×1 IMPLANT
SET INTRODUCER MICROPUNCT 5F (INTRODUCER) ×1 IMPLANT
SHEATH CLASSIC 7F (SHEATH) ×1 IMPLANT
SHEATH PINNACLE 6F 10CM (SHEATH) ×1 IMPLANT
TRAY PACEMAKER INSERTION (PACKS) ×1 IMPLANT

## 2016-11-06 NOTE — Progress Notes (Signed)
The patient is seen post procedure. She feels well, denies any kind of CP or SOB VSS Telemetry is SR 60's Pacer site is without hematoma, dressing is dry Patient instructed to remove outer bandage and sling tomorrow Wound care and activity instructions were discussed with the patient Anticipate discharge once CXR and device check are completed and cleared by Dr. Johney Frame. Follow up has been arranged  Francis Dowse, PA-C

## 2016-11-06 NOTE — Discharge Instructions (Signed)
° ° °  Supplemental Discharge Instructions for  Pacemaker/Defibrillator Patients  Activity No heavy lifting or vigorous activity with your left/right arm for 6 to 8 weeks.  Do not raise your left/right arm above your head for one week.  Gradually raise your affected arm as drawn below.             11/10/16                     11/11/16                   11/12/16                   11/13/16 __  NO DRIVING for 1 week    ; you may begin driving on  1/61/09 .  WOUND CARE          *Remove outer/plastic dressing and arm sling tomorrow, 11/07/16. (keep the steri-strips/paper tapes underneath in place until your wound check visit - Keep the wound area clean and dry.  Do not get this area wet, no showers until cleared to at your wound check visit - The tape/steri-strips on your wound will fall off; do not pull them off.  No bandage is needed on the site.  DO  NOT apply any creams, oils, or ointments to the wound area. - If you notice any drainage or discharge from the wound, any swelling or bruising at the site, or you develop a fever > 101? F after you are discharged home, call the office at once.  Special Instructions - You are still able to use cellular telephones; use the ear opposite the side where you have your pacemaker/defibrillator.  Avoid carrying your cellular phone near your device. - When traveling through airports, show security personnel your identification card to avoid being screened in the metal detectors.  Ask the security personnel to use the hand wand. - Avoid arc welding equipment, MRI testing (magnetic resonance imaging), TENS units (transcutaneous nerve stimulators).  Call the office for questions about other devices. - Avoid electrical appliances that are in poor condition or are not properly grounded. - Microwave ovens are safe to be near or to operate.

## 2016-11-06 NOTE — H&P (Signed)
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  Since her recent PPM implant, the patient reports doing very well.  Unfortunately, remote transmissions reveal that her RV lead has dislodged.  Today, she denies symptoms of palpitations, chest pain, shortness of breath,  lower extremity edema, or dizziness.  The patient is otherwise without complaint today.  She presents today for pacemaker system revision.      Past Medical History:  Diagnosis Date  . Arthritis   . Bell's palsy   . Chronic diastolic CHF (congestive heart failure) (HCC)    a. 11/2015 Echo: EF 50-55%, Gr1 DD, mildly dil LA.  Marland Kitchen Complete heart block (HCC)    mobitz II second degree AV  . Essential hypertension   . GERD (gastroesophageal reflux disease)   . H/O hiatal hernia   . Headache(784.0)    migraines  . History of kidney stones   . Hypothyroidism   . Shingles   . Syncope    a. 11/2015 - subsequent event monitoring revealed CHF in setting of beta blocker therapy - resolved with discontinuation of coreg.        Past Surgical History:  Procedure Laterality Date  . ABDOMINAL HYSTERECTOMY    . BACK SURGERY     lumbar/ discectomy  . BREAST SURGERY Right    biopsy with titanium clip placement  . CARDIAC CATHETERIZATION     "years ago"  . CARDIAC CATHETERIZATION N/A 01/03/2016   Procedure: Temporary Pacemaker;  Surgeon: Lennette Bihari, MD;  Location: [facility];  Service: Cardiovascular;  Laterality: N/A;  . CHOLECYSTECTOMY    . DILATION AND CURETTAGE OF UTERUS    . EP IMPLANTABLE DEVICE N/A 04/30/2016   S[employer] MRI PPM implanted for transient complete heart block by Dr Johney Frame  . LITHOTRIPSY    . PARATHYROIDECTOMY Right   . TOTAL KNEE ARTHROPLASTY Left 09/25/2013   Procedure: LEFT TOTAL KNEE ARTHROPLASTY;  Surgeon: [name], MD;  Location: [facility];   Service: Orthopedics;  Laterality: Left;  . TUBAL LIGATION      ROS- all systems are reviewed and negative except as per HPI above        Current Outpatient Prescriptions  Medication Sig Dispense Refill  . amLODipine (NORVASC) 5 MG tablet Take 1 tablet (5 mg total) by mouth daily. 30 tablet 6  . aspirin EC 81 MG tablet Take 81 mg by mouth daily.    . carvedilol (COREG) 3.125 MG tablet Take 1 tablet (3.125 mg total) by mouth 2 (two) times daily with a meal. 180 tablet 3  . Cholecalciferol (VITAMIN D) 2000 UNITS tablet Take 2,000 Units by mouth daily.    . furosemide (LASIX) 20 MG tablet Take 1 tablet (20 mg total) by mouth daily. 30 tablet 6  . losartan (COZAAR) 100 MG tablet Take 100 mg by mouth every morning.    . naproxen sodium (ANAPROX) 220 MG tablet Take 440 mg by mouth daily as needed (arthritis).    Marland Kitchen omeprazole (PRILOSEC) 20 MG capsule Take 20 mg by mouth daily. For heartburn     No current facility-administered medications for this visit.     Physical Exam:  Vitals:   11/06/16 0534  BP: (!) 177/83  Pulse: 73  Temp: 98.1 F (36.7 C)    GEN- The patient is well appearing, alert and oriented x 3 today.   Head- normocephalic, atraumatic  Eyes-  Sclera clear, conjunctiva pink Ears- hearing intact Oropharynx- clear Lungs- Clear to ausculation bilaterally, normal work of breathing Chest- pacemaker pocket is well healed Heart- Regular rate and rhythm, no murmurs, rubs or gallops, PMI not laterally displaced GI- soft, NT, ND, + BS Extremities- no clubbing, cyanosis, or edema  Pacemaker interrogation- reviewed in detail today,  See PACEART report  CXR is reviewed and reveals dislodged RV lead  Assessment and Plan:  1. Mobitz II second degree AV block with syncope I would therefore recommend pacemaker system revision at this time.  Risks, benefits, alternatives to pacemaker system revision were discussed in detail with the patient today. The patient  understands that the risks include but are not limited to bleeding, infection, pneumothorax, perforation, tamponade, vascular damage, renal failure, MI, stroke, death,  and lead dislodgement and wishes to proceed at this time.  Hillis Range MD, [facility] 11/06/2016 7:21 AM

## 2016-11-06 NOTE — Care Management Note (Signed)
Case Management Note  Patient Details  Name: Kelly Castaneda MRN: [id number] Date of Birth: [birth year]  Subjective/Objective:    From home , s/p pacemaker lead revision, NCM will cont to follow for dc needs.                Action/Plan:   Expected Discharge Date:                  Expected Discharge Plan:  Home/Self Care  In-House Referral:     Discharge planning Services  CM Consult  Post Acute Care Choice:    Choice offered to:     DME Arranged:    DME Agency:     HH Arranged:    HH Agency:     Status of Service:  Completed, signed off  If discussed at [employer] of Stay Meetings, dates discussed:    Additional Comments:  Leone Haven, RN 11/06/2016, 3:19 PM

## 2016-11-06 NOTE — Progress Notes (Signed)
Post procedure instructions discussed with patient.  In particular, asked her to pay particular attention to signs and symptoms of infection.

## 2016-11-18 ENCOUNTER — Ambulatory Visit (INDEPENDENT_AMBULATORY_CARE_PROVIDER_SITE_OTHER): Payer: Medicare HMO | Admitting: *Deleted

## 2016-11-18 DIAGNOSIS — Z959 Presence of cardiac and vascular implant and graft, unspecified: Secondary | ICD-10-CM

## 2016-11-18 DIAGNOSIS — R001 Bradycardia, unspecified: Secondary | ICD-10-CM | POA: Diagnosis not present

## 2016-11-18 DIAGNOSIS — I442 Atrioventricular block, complete: Secondary | ICD-10-CM | POA: Diagnosis not present

## 2016-11-18 LAB — CUP PACEART INCLINIC DEVICE CHECK
Brady Statistic RA Percent Paced: 21 %
Brady Statistic RV Percent Paced: 6.9 %
Implantable Lead Location: 753860
Lead Channel Impedance Value: 425 Ohm
Lead Channel Impedance Value: 525 Ohm
Lead Channel Pacing Threshold Amplitude: 0.5 V
Lead Channel Pacing Threshold Amplitude: 0.5 V
Lead Channel Pacing Threshold Amplitude: 1 V
Lead Channel Pacing Threshold Pulse Width: 0.5 ms
Lead Channel Sensing Intrinsic Amplitude: 12 mV
Lead Channel Setting Pacing Amplitude: 2 V
Lead Channel Setting Pacing Pulse Width: 0.5 ms
Lead Channel Setting Sensing Sensitivity: 2 mV
MDC IDC LEAD IMPLANT DT: 20170928
MDC IDC LEAD IMPLANT DT: 20180406
MDC IDC LEAD LOCATION: 753859
MDC IDC MSMT BATTERY VOLTAGE: 3.01 V
MDC IDC MSMT LEADCHNL RA PACING THRESHOLD AMPLITUDE: 1 V
MDC IDC MSMT LEADCHNL RA PACING THRESHOLD PULSEWIDTH: 0.5 ms
MDC IDC MSMT LEADCHNL RA SENSING INTR AMPL: 5 mV
MDC IDC MSMT LEADCHNL RV PACING THRESHOLD PULSEWIDTH: 0.5 ms
MDC IDC MSMT LEADCHNL RV PACING THRESHOLD PULSEWIDTH: 0.5 ms
MDC IDC PG IMPLANT DT: 20170928
MDC IDC PG SERIAL: 7950948
MDC IDC SESS DTM: 20180418162038
MDC IDC SET LEADCHNL RV PACING AMPLITUDE: 3.5 V

## 2016-11-18 NOTE — Progress Notes (Signed)
Wound check appointment s/p RV lead revision 11/06/16. Steri-strips removed. Wound without redness or edema. Incision edges approximated, wound well healed. Normal device function. Thresholds, sensing, and impedances consistent with implant measurements. Device programmed at 3.5V in RV for extra safety margin until 3 month visit. Histogram distribution appropriate for patient and level of activity. No mode switches or high ventricular rates noted. Patient educated about wound care, arm mobility, lifting restrictions. Merlin as scheduled, ROV with JA 02/01/17.

## 2016-11-19 ENCOUNTER — Ambulatory Visit: Payer: Medicare HMO

## 2016-12-30 DIAGNOSIS — G4733 Obstructive sleep apnea (adult) (pediatric): Secondary | ICD-10-CM | POA: Diagnosis not present

## 2017-02-01 ENCOUNTER — Ambulatory Visit (INDEPENDENT_AMBULATORY_CARE_PROVIDER_SITE_OTHER): Payer: Medicare HMO | Admitting: Internal Medicine

## 2017-02-01 ENCOUNTER — Ambulatory Visit
Admission: RE | Admit: 2017-02-01 | Discharge: 2017-02-01 | Disposition: A | Discharge status: Home / Self Care | Payer: Medicare HMO | Source: Ambulatory Visit | Attending: Internal Medicine | Admitting: Internal Medicine

## 2017-02-01 ENCOUNTER — Encounter: Payer: Self-pay | Admitting: Internal Medicine

## 2017-02-01 VITALS — BP 138/74 | HR 71 | Ht 65.0 in | Wt 243.2 lb

## 2017-02-01 DIAGNOSIS — I441 Atrioventricular block, second degree: Secondary | ICD-10-CM

## 2017-02-01 DIAGNOSIS — T82110A Breakdown (mechanical) of cardiac electrode, initial encounter: Secondary | ICD-10-CM

## 2017-02-01 DIAGNOSIS — I10 Essential (primary) hypertension: Secondary | ICD-10-CM

## 2017-02-01 DIAGNOSIS — Z45018 Encounter for adjustment and management of other part of cardiac pacemaker: Secondary | ICD-10-CM | POA: Diagnosis not present

## 2017-02-01 DIAGNOSIS — Z950 Presence of cardiac pacemaker: Secondary | ICD-10-CM | POA: Diagnosis not present

## 2017-02-01 NOTE — Patient Instructions (Addendum)
Medication Instructions:    Your physician recommends that you continue on your current medications as directed. Please refer to the Current Medication list given to you today.  - If you need a refill on your cardiac medications before your next appointment, please call your pharmacy.   Labwork:  None ordered  Testing/Procedures:  A chest x-ray takes a picture of the organs and structures inside the chest, including the heart, lungs, and blood vessels. This test can show several things, including, whether the heart is enlarges; whether fluid is building up in the lungs; and whether pacemaker / defibrillator leads are still in place.  Follow-Up: Remote monitoring is used to monitor your Pacemaker of ICD from home. This monitoring reduces the number of office visits required to check your device to one time per year. It allows us to keep an eye on the functioning of your device to ensure it is working properly. You are scheduled for a device check from home on 05/03/2017. You may send your transmission at any time that day. If you have a wireless device, the transmission will be sent automatically. After your physician reviews your transmission, you will receive a postcard with your next transmission date.    Yor physician wants you to follow-up in: 6 months with Gypsy BalsamAmber Seiler, NP.  You will receive a reminder letter in the mail two months in advance. If you don't receive a letter, please call our office to schedule the follow-up appointment.  Thank you for choosing [facility]!!    Any Other Special Instructions Will Be Listed Below (If Applicable).

## 2017-02-01 NOTE — Progress Notes (Signed)
PCP: Marden Noble, MD Primary Cardiologist:  Dr Viona Gilmore is a 66 y.o. female who presents today for routine electrophysiology followup.  Since her RV lead revision 4/18, the patient reports doing very well.  She denies procedure related complications.  Today, she denies symptoms of palpitations, chest pain, shortness of breath,  lower extremity edema, dizziness, presyncope, or syncope.  The patient is otherwise without complaint today.   Past Medical History:  Diagnosis Date  . Arthritis    "wrists, right knee" (11/06/2016)  . Bell's palsy   . Chronic diastolic CHF (congestive heart failure) (HCC)    a. 11/2015 Echo: EF 50-55%, Gr1 DD, mildly dil LA.  Marland Kitchen Complete heart block (HCC)    mobitz II second degree AV  . Essential hypertension   . Family history of adverse reaction to anesthesia    "had a little trouble waking my brother up; dx'd sleep apnea"  . GERD (gastroesophageal reflux disease)   . H/O hiatal hernia   . History of kidney stones   . Hypothyroidism    "not on RX" (11/06/2016)  . Migraine    "none in years" (11/06/2016)  . OSA on CPAP    "I wear nose pillows"  . Presence of permanent cardiac pacemaker   . Shingles   . Syncope    a. 11/2015 - subsequent event monitoring revealed CHF in setting of beta blocker therapy - resolved with discontinuation of coreg.   Past Surgical History:  Procedure Laterality Date  . BACK SURGERY    . BREAST BIOPSY Right 1990s   biopsy with titanium clip placement  . CARDIAC CATHETERIZATION     "years ago"  . CARDIAC CATHETERIZATION N/A 01/03/2016   Procedure: Temporary Pacemaker;  Surgeon: Lennette Bihari, MD;  [facility];  Service: Cardiovascular;  Laterality: N/A;  . DILATION AND CURETTAGE OF UTERUS    . EP IMPLANTABLE DEVICE N/A 04/30/2016   SJM Assurity MRI PPM implanted for transient complete heart block by Dr Johney Frame  . INSERT / REPLACE / REMOVE PACEMAKER    . JOINT REPLACEMENT    . LAPAROSCOPIC  CHOLECYSTECTOMY  ~ 1990  . LEAD REVISION  11/06/2016   pacemaker  . LEAD REVISION/REPAIR N/A 11/06/2016   Procedure: Lead Revision/Repair;  Surgeon: Hillis Range, MD;  [facility];  Service: Cardiovascular;  Laterality: N/A;  . LITHOTRIPSY  1990s   "I've had a couple of those" (11/06/2016)  . LUMBAR DISC SURGERY  ~ 2007  . PARATHYROIDECTOMY Right 1990s  . TOTAL KNEE ARTHROPLASTY Left 09/25/2013   Procedure: LEFT TOTAL KNEE ARTHROPLASTY;  Surgeon: [name], MD;  Location: [facility];  Service: Orthopedics;  Laterality: Left;  . TUBAL LIGATION    . VAGINAL HYSTERECTOMY  1996    ROS- all systems are reviewed and negative except as per HPI above  Current Outpatient Prescriptions  Medication Sig Dispense Refill  . amLODipine (NORVASC) 5 MG tablet Take 1 tablet (5 mg total) by mouth daily. 30 tablet 6  . aspirin EC 81 MG tablet Take 81 mg by mouth daily.    . carvedilol (COREG) 3.125 MG tablet Take 1 tablet (3.125 mg total) by mouth 2 (two) times daily with a meal. 180 tablet 3  . Cholecalciferol (VITAMIN D) 2000 UNITS tablet Take 2,000 Units by mouth daily.    . furosemide (LASIX) 20 MG tablet Take 1 tablet (20 mg total) by mouth daily. 30 tablet 6  . losartan (COZAAR) 100  MG tablet Take 100 mg by mouth every morning.    . naproxen sodium (ANAPROX) 220 MG tablet Take 440 mg by mouth daily as needed (arthritis).    Marland Kitchen. omeprazole (PRILOSEC) 20 MG capsule Take 20 mg by mouth daily. For heartburn     No current facility-administered medications for this visit.     Physical Exam: Vitals:   02/01/17 1148  BP: 138/74  Pulse: 71  SpO2: 97%  Weight: 243 lb 3.2 oz (110.3 kg)  Height: 5\' 5"  (1.651 m)    GEN- The patient is pleasant but overweight appearing, alert and oriented x 3 today.   Head- normocephalic, atraumatic Eyes-  Sclera clear, conjunctiva pink Ears- hearing intact Oropharynx- clear Lungs- Clear to ausculation bilaterally, normal work of breathing Chest-  pacemaker pocket is well healed Heart- Regular rate and rhythm, no murmurs, rubs or gallops, PMI not laterally displaced GI- soft, NT, ND, + BS Extremities- no clubbing, cyanosis, or edema  Pacemaker interrogation- reviewed in detail today,  See PACEART report  ekg tracing ordered today is personally reviewed and reveals sinus rhythm with bifascicular block  Assessment and Plan:  1. Mobitz II second degree AV block Normal pacemaker function though RA threshold is now increased.  Atrial sensing and impedance are normal.  RV lead numbers are good.  Atrial threshold is 2V @0 .5 mesc or 1.5V @1  msec.  I have therefore programmed output to 2.5V @1  msec. CXR today See Pace Art report No changes today  2. HTN Stable No change required today  3.  Chronic diastolic dysfunction Stable, euvolemic No change required today  Merlin Return to see EP NP in 6 months given new changes on atrial lead Would like to avoid lead revision unless CXR is markedly abnormal  Hillis RangeJames Calloway Andrus MD, [facility] 02/01/2017 12:17 PM

## 2017-02-04 ENCOUNTER — Encounter: Payer: Medicare HMO | Admitting: *Deleted

## 2017-02-04 ENCOUNTER — Other Ambulatory Visit: Payer: Self-pay | Admitting: Internal Medicine

## 2017-02-04 DIAGNOSIS — Z1231 Encounter for screening mammogram for malignant neoplasm of breast: Secondary | ICD-10-CM

## 2017-02-18 DIAGNOSIS — H5213 Myopia, bilateral: Secondary | ICD-10-CM | POA: Diagnosis not present

## 2017-03-17 ENCOUNTER — Ambulatory Visit
Admission: RE | Admit: 2017-03-17 | Discharge: 2017-03-17 | Disposition: A | Discharge status: Home / Self Care | Payer: Medicare HMO | Source: Ambulatory Visit | Attending: Internal Medicine | Admitting: Internal Medicine

## 2017-03-17 DIAGNOSIS — Z1231 Encounter for screening mammogram for malignant neoplasm of breast: Secondary | ICD-10-CM | POA: Diagnosis not present

## 2017-05-03 ENCOUNTER — Ambulatory Visit (INDEPENDENT_AMBULATORY_CARE_PROVIDER_SITE_OTHER): Payer: Medicare HMO | Admitting: *Deleted

## 2017-05-03 DIAGNOSIS — I441 Atrioventricular block, second degree: Secondary | ICD-10-CM

## 2017-05-04 NOTE — Progress Notes (Signed)
Remote pacemaker transmission.   

## 2017-05-05 ENCOUNTER — Encounter: Payer: Self-pay | Admitting: Cardiology

## 2017-05-05 DIAGNOSIS — R69 Illness, unspecified: Secondary | ICD-10-CM | POA: Diagnosis not present

## 2017-05-06 LAB — CUP PACEART REMOTE DEVICE CHECK
Brady Statistic AP VP Percent: 11 %
Brady Statistic AS VP Percent: 4.5 %
Brady Statistic AS VS Percent: 71 %
Brady Statistic RA Percent Paced: 21 %
Date Time Interrogation Session: 20181001075110
Implantable Lead Implant Date: 20180406
Implantable Lead Location: 753859
Implantable Lead Model: 5076
Implantable Pulse Generator Implant Date: 20170928
Lead Channel Impedance Value: 430 Ohm
Lead Channel Pacing Threshold Pulse Width: 1 ms
Lead Channel Sensing Intrinsic Amplitude: 7.9 mV
Lead Channel Setting Pacing Amplitude: 2.5 V
Lead Channel Setting Sensing Sensitivity: 2 mV
MDC IDC LEAD IMPLANT DT: 20170928
MDC IDC LEAD LOCATION: 753860
MDC IDC MSMT BATTERY REMAINING LONGEVITY: 99 mo
MDC IDC MSMT BATTERY REMAINING PERCENTAGE: 95.5 %
MDC IDC MSMT BATTERY VOLTAGE: 3.01 V
MDC IDC MSMT LEADCHNL RA IMPEDANCE VALUE: 460 Ohm
MDC IDC MSMT LEADCHNL RA PACING THRESHOLD AMPLITUDE: 2.375 V
MDC IDC MSMT LEADCHNL RA SENSING INTR AMPL: 1.1 mV
MDC IDC MSMT LEADCHNL RV PACING THRESHOLD AMPLITUDE: 0.5 V
MDC IDC MSMT LEADCHNL RV PACING THRESHOLD PULSEWIDTH: 0.5 ms
MDC IDC SET LEADCHNL RV PACING AMPLITUDE: 2.5 V
MDC IDC SET LEADCHNL RV PACING PULSEWIDTH: 0.5 ms
MDC IDC STAT BRADY AP VS PERCENT: 12 %
MDC IDC STAT BRADY RV PERCENT PACED: 15 %
Pulse Gen Model: 2272
Pulse Gen Serial Number: 7950948

## 2017-06-17 DIAGNOSIS — Z950 Presence of cardiac pacemaker: Secondary | ICD-10-CM | POA: Diagnosis not present

## 2017-06-17 DIAGNOSIS — E039 Hypothyroidism, unspecified: Secondary | ICD-10-CM | POA: Diagnosis not present

## 2017-06-17 DIAGNOSIS — M179 Osteoarthritis of knee, unspecified: Secondary | ICD-10-CM | POA: Diagnosis not present

## 2017-06-17 DIAGNOSIS — I10 Essential (primary) hypertension: Secondary | ICD-10-CM | POA: Diagnosis not present

## 2017-06-17 DIAGNOSIS — E559 Vitamin D deficiency, unspecified: Secondary | ICD-10-CM | POA: Diagnosis not present

## 2017-06-17 DIAGNOSIS — R55 Syncope and collapse: Secondary | ICD-10-CM | POA: Diagnosis not present

## 2017-06-17 DIAGNOSIS — Z0000 Encounter for general adult medical examination without abnormal findings: Secondary | ICD-10-CM | POA: Diagnosis not present

## 2017-06-17 DIAGNOSIS — Z1389 Encounter for screening for other disorder: Secondary | ICD-10-CM | POA: Diagnosis not present

## 2017-06-17 DIAGNOSIS — K219 Gastro-esophageal reflux disease without esophagitis: Secondary | ICD-10-CM | POA: Diagnosis not present

## 2017-06-17 DIAGNOSIS — I420 Dilated cardiomyopathy: Secondary | ICD-10-CM | POA: Diagnosis not present

## 2017-08-02 ENCOUNTER — Ambulatory Visit (INDEPENDENT_AMBULATORY_CARE_PROVIDER_SITE_OTHER): Payer: Medicare HMO | Admitting: *Deleted

## 2017-08-02 DIAGNOSIS — I441 Atrioventricular block, second degree: Secondary | ICD-10-CM

## 2017-08-04 NOTE — Progress Notes (Signed)
Remote pacemaker transmission.   

## 2017-08-06 ENCOUNTER — Encounter: Payer: Self-pay | Admitting: Cardiology

## 2017-08-07 LAB — CUP PACEART REMOTE DEVICE CHECK
Brady Statistic AP VP Percent: 15 %
Brady Statistic AS VP Percent: 3.6 %
Brady Statistic AS VS Percent: 55 %
Implantable Lead Implant Date: 20180406
Lead Channel Impedance Value: 380 Ohm
Lead Channel Impedance Value: 510 Ohm
Lead Channel Pacing Threshold Amplitude: 1.5 V
Lead Channel Pacing Threshold Pulse Width: 0.5 ms
Lead Channel Sensing Intrinsic Amplitude: 4.6 mV
Lead Channel Setting Pacing Amplitude: 2.5 V
MDC IDC LEAD IMPLANT DT: 20170928
MDC IDC LEAD LOCATION: 753859
MDC IDC LEAD LOCATION: 753860
MDC IDC MSMT BATTERY REMAINING LONGEVITY: 103 mo
MDC IDC MSMT BATTERY REMAINING PERCENTAGE: 95.5 %
MDC IDC MSMT BATTERY VOLTAGE: 2.99 V
MDC IDC MSMT LEADCHNL RA PACING THRESHOLD PULSEWIDTH: 1 ms
MDC IDC MSMT LEADCHNL RA SENSING INTR AMPL: 0.5 mV
MDC IDC MSMT LEADCHNL RV PACING THRESHOLD AMPLITUDE: 0.5 V
MDC IDC PG IMPLANT DT: 20170928
MDC IDC PG SERIAL: 7950948
MDC IDC SESS DTM: 20181231123203
MDC IDC SET LEADCHNL RA PACING AMPLITUDE: 2.5 V
MDC IDC SET LEADCHNL RV PACING PULSEWIDTH: 0.5 ms
MDC IDC SET LEADCHNL RV SENSING SENSITIVITY: 2 mV
MDC IDC STAT BRADY AP VS PERCENT: 16 %
MDC IDC STAT BRADY RA PERCENT PACED: 24 %
MDC IDC STAT BRADY RV PERCENT PACED: 19 %

## 2017-08-17 ENCOUNTER — Other Ambulatory Visit: Payer: Self-pay | Admitting: Internal Medicine

## 2017-08-17 ENCOUNTER — Ambulatory Visit
Admission: RE | Admit: 2017-08-17 | Discharge: 2017-08-17 | Disposition: A | Discharge status: Home / Self Care | Payer: Medicare HMO | Source: Ambulatory Visit | Attending: Internal Medicine | Admitting: Internal Medicine

## 2017-08-17 DIAGNOSIS — Z79899 Other long term (current) drug therapy: Secondary | ICD-10-CM | POA: Diagnosis not present

## 2017-08-17 DIAGNOSIS — R0600 Dyspnea, unspecified: Secondary | ICD-10-CM

## 2017-08-17 DIAGNOSIS — R0609 Other forms of dyspnea: Secondary | ICD-10-CM | POA: Diagnosis not present

## 2017-08-17 DIAGNOSIS — R0602 Shortness of breath: Secondary | ICD-10-CM | POA: Diagnosis not present

## 2017-08-19 NOTE — Progress Notes (Signed)
Cardiology Office Note   Date:  08/20/2017   ID:  Kelly, Castaneda 1951-01-28, MRN [id number]  PCP:  Marden Noble, MD    No chief complaint on file.  Pacer/ heart failure  Wt Readings from Last 3 Encounters:  08/20/17 244 lb 6.4 oz (110.9 kg)  02/01/17 243 lb 3.2 oz (110.3 kg)  11/06/16 238 lb 1.6 oz (108 kg)       History of Present Illness: Kelly Castaneda is a 67 y.o. female  Who has had systolic dysfunction in the past.  She had syncope and monitor showed symptomatic pauses.  She was admitted for bradycardia.  Her bradycardia resolved initially with stopping carvedilol.  Ultimately, EF improved to 50-55% in 2017, at the time of her pacer.    She also required a pacemaker and RV lead revision in 4/18, due to Mobitz type II heart block.  Denies : Chest pain. Dizziness. Leg edema. Nitroglycerin use. Orthopnea. Palpitations. Paroxysmal nocturnal dyspnea.  Syncope.    She has been tired and SHOB recently, over the past week.  Her B12 was very low. Bloodwork was otherwise ok per her report.CXR was checked by PMD and was ok per her report, except for a question of the pacer leads.         Past Medical History:  Diagnosis Date  . Arthritis    "wrists, right knee" (11/06/2016)  . Bell's palsy   . Chronic diastolic CHF (congestive heart failure) (HCC)    a. 11/2015 Echo: EF 50-55%, Gr1 DD, mildly dil LA.  Marland Kitchen Complete heart block (HCC)    mobitz II second degree AV  . Essential hypertension   . Family history of adverse reaction to anesthesia    "had a little trouble waking my brother up; dx'd sleep apnea"  . GERD (gastroesophageal reflux disease)   . H/O hiatal hernia   . History of kidney stones   . Hypothyroidism    "not on RX" (11/06/2016)  . Migraine    "none in years" (11/06/2016)  . OSA on CPAP    "I wear nose pillows"  . Presence of permanent cardiac pacemaker   . Shingles   . Syncope    a. 11/2015 - subsequent event monitoring revealed CHF in setting of  beta blocker therapy - resolved with discontinuation of coreg.    Past Surgical History:  Procedure Laterality Date  . BACK SURGERY    . BREAST BIOPSY Right 1990s   biopsy with titanium clip placement  . CARDIAC CATHETERIZATION     "years ago"  . CARDIAC CATHETERIZATION N/A 01/03/2016   Procedure: Temporary Pacemaker;  Surgeon: Lennette Bihari, MD;  [facility];  Service: Cardiovascular;  Laterality: N/A;  . DILATION AND CURETTAGE OF UTERUS    . EP IMPLANTABLE DEVICE N/A 04/30/2016   SJM Assurity MRI PPM implanted for transient complete heart block by Dr Johney Frame  . INSERT / REPLACE / REMOVE PACEMAKER    . JOINT REPLACEMENT    . LAPAROSCOPIC CHOLECYSTECTOMY  ~ 1990  . LEAD REVISION  11/06/2016   pacemaker  . LEAD REVISION/REPAIR N/A 11/06/2016   Procedure: Lead Revision/Repair;  Surgeon: Hillis Range, MD;  [facility];  Service: Cardiovascular;  Laterality: N/A;  . LITHOTRIPSY  1990s   "I've had a couple of those" (11/06/2016)  . LUMBAR DISC SURGERY  ~ 2007  . PARATHYROIDECTOMY Right 1990s  . TOTAL KNEE ARTHROPLASTY Left 09/25/2013   Procedure: LEFT TOTAL KNEE  ARTHROPLASTY;  Surgeon: Loanne DrillingFrank V Aluisio, MD;  Location: [facility];  Service: Orthopedics;  Laterality: Left;  . TUBAL LIGATION    . VAGINAL HYSTERECTOMY  1996     Current Outpatient Medications  Medication Sig Dispense Refill  . amLODipine (NORVASC) 5 MG tablet Take 1 tablet (5 mg total) by mouth daily. 30 tablet 6  . aspirin EC 81 MG tablet Take 81 mg by mouth daily.    . carvedilol (COREG) 3.125 MG tablet Take 1 tablet (3.125 mg total) by mouth 2 (two) times daily with a meal. 180 tablet 3  . Cholecalciferol (VITAMIN D) 2000 UNITS tablet Take 2,000 Units by mouth daily.    . Cyanocobalamin (VITAMIN B 12 PO) Take 100 mcg by mouth daily.    . furosemide (LASIX) 20 MG tablet Take 1 tablet (20 mg total) by mouth daily. 30 tablet 6  . losartan (COZAAR) 100 MG tablet Take 100 mg by mouth every morning.     . naproxen sodium (ANAPROX) 220 MG tablet Take 440 mg by mouth daily as needed (arthritis).    Marland Kitchen. omeprazole (PRILOSEC) 20 MG capsule Take 20 mg by mouth daily. For heartburn     No current facility-administered medications for this visit.     Allergies:   Codeine; Fentanyl; and Morphine and related    Social History:  The patient  reports that  has never smoked. she has never used smokeless tobacco. She reports that she does not drink alcohol or use drugs.   Family History:  The patient's family history includes CAD in her father; Diabetes in her mother; Heart attack in her father; Hypertension in her father and mother.    ROS:  Please see the history of present illness.   Otherwise, review of systems are positive for fatigue.   All other systems are reviewed and negative.    PHYSICAL EXAM: VS:  BP 128/72   Pulse 73   Ht 5\' 5"  (1.651 m)   Wt 244 lb 6.4 oz (110.9 kg)   SpO2 92%   BMI 40.67 kg/m  , BMI Body mass index is 40.67 kg/m. GEN: Well nourished, well developed, in no acute distress  HEENT: normal  Neck: no JVD, carotid bruits, or masses Cardiac: RRR; no murmurs, rubs, or gallops,no edema  Respiratory:  clear to auscultation bilaterally, normal work of breathing GI: soft, nontender, nondistended, + BS MS: no deformity or atrophy  Skin: warm and dry, no rash Neuro:  Strength and sensation are intact Psych: euthymic mood, full affect   EKG:   The ekg ordered today demonstrates AV pacing.  It appears the atrial lead is not capturing.  The V lead is capturing.   Recent Labs: 11/06/2016: BUN 15; Creatinine, Ser 0.75; Hemoglobin 13.0; Platelets 224; Potassium 4.1; Sodium 139   Lipid Panel No results found for: CHOL, TRIG, HDL, CHOLHDL, VLDL, LDLCALC, LDLDIRECT   Other studies Reviewed: Additional studies/ records that were reviewed today with results demonstrating: I personally reviewed the chest x-ray.  It does appear that the atrial lead has come back to the SVC.   The ventricular lead appears to be in place.   ASSESSMENT AND PLAN:  1. Pacemaker: F/u with Dr. Johney FrameAllred.  I spoke with him today.  He wants to see her today in the office.  Perhaps if her atrial kick is gone, this is contributing to her shortness of breath and fatigue feeling. 2. Systolic heart failure: resolved.  Ejection fraction normalized back in 2017.  She does  not show signs of congestive heart failure. 3. HTN: Blood pressure well controlled.  Continue current medications.   Current medicines are reviewed at length with the patient today.  The patient concerns regarding her medicines were addressed.  The following changes have been made:  No change  Labs/ tests ordered today include:  No orders of the defined types were placed in this encounter.   Recommend 150 minutes/week of aerobic exercise Low fat, low carb, high fiber diet recommended  Disposition:   FU today with Dr. Johney Frame.   Signed, Lance Muss, MD  08/20/2017 9:14 AM    [facility] [address] [place], [place], [place]  [address] Phone: [phone]; Fax: [phone]

## 2017-08-20 ENCOUNTER — Ambulatory Visit: Payer: Medicare HMO | Admitting: Interventional Cardiology

## 2017-08-20 ENCOUNTER — Ambulatory Visit (INDEPENDENT_AMBULATORY_CARE_PROVIDER_SITE_OTHER): Payer: Medicare HMO | Admitting: Internal Medicine

## 2017-08-20 ENCOUNTER — Encounter: Payer: Self-pay | Admitting: Interventional Cardiology

## 2017-08-20 VITALS — BP 128/72 | HR 73 | Ht 65.0 in | Wt 244.4 lb

## 2017-08-20 DIAGNOSIS — Z950 Presence of cardiac pacemaker: Secondary | ICD-10-CM | POA: Diagnosis not present

## 2017-08-20 DIAGNOSIS — I5022 Chronic systolic (congestive) heart failure: Secondary | ICD-10-CM | POA: Diagnosis not present

## 2017-08-20 DIAGNOSIS — Z959 Presence of cardiac and vascular implant and graft, unspecified: Secondary | ICD-10-CM

## 2017-08-20 DIAGNOSIS — I10 Essential (primary) hypertension: Secondary | ICD-10-CM

## 2017-08-20 DIAGNOSIS — I441 Atrioventricular block, second degree: Secondary | ICD-10-CM

## 2017-08-20 DIAGNOSIS — T82110A Breakdown (mechanical) of cardiac electrode, initial encounter: Secondary | ICD-10-CM | POA: Diagnosis not present

## 2017-08-20 LAB — BASIC METABOLIC PANEL
BUN / CREAT RATIO: 19 (ref 12–28)
BUN: 17 mg/dL (ref 8–27)
CO2: 23 mmol/L (ref 20–29)
CREATININE: 0.88 mg/dL (ref 0.57–1.00)
Calcium: 10.1 mg/dL (ref 8.7–10.3)
Chloride: 101 mmol/L (ref 96–106)
GFR, EST AFRICAN AMERICAN: 78 mL/min/{1.73_m2} (ref >59)
GFR, EST NON AFRICAN AMERICAN: 68 mL/min/{1.73_m2} (ref >59)
Glucose: 116 mg/dL — ABNORMAL HIGH (ref 65–99)
Potassium: 4.4 mmol/L (ref 3.5–5.2)
SODIUM: 143 mmol/L (ref 134–144)

## 2017-08-20 LAB — CUP PACEART INCLINIC DEVICE CHECK
Battery Voltage: 2.99 V
Brady Statistic RA Percent Paced: 27 %
Date Time Interrogation Session: 20190118111640
Implantable Lead Implant Date: 20170928
Implantable Lead Implant Date: 20180406
Implantable Lead Location: 753860
Implantable Pulse Generator Implant Date: 20170928
Lead Channel Impedance Value: 375 Ohm
Lead Channel Impedance Value: 525 Ohm
Lead Channel Pacing Threshold Amplitude: 1 V
Lead Channel Pacing Threshold Pulse Width: 0.5 ms
Lead Channel Pacing Threshold Pulse Width: 1 ms
Lead Channel Sensing Intrinsic Amplitude: 0.5 mV
Lead Channel Setting Pacing Amplitude: 3.5 V
Lead Channel Setting Sensing Sensitivity: 4 mV
MDC IDC LEAD LOCATION: 753859
MDC IDC MSMT BATTERY REMAINING LONGEVITY: 79 mo
MDC IDC MSMT LEADCHNL RA PACING THRESHOLD AMPLITUDE: 2.25 V
MDC IDC MSMT LEADCHNL RV SENSING INTR AMPL: 6.4 mV
MDC IDC SET LEADCHNL RV PACING AMPLITUDE: 5 V
MDC IDC SET LEADCHNL RV PACING PULSEWIDTH: 0.5 ms
MDC IDC STAT BRADY RV PERCENT PACED: 23 %
Pulse Gen Model: 2272
Pulse Gen Serial Number: 7950948

## 2017-08-20 LAB — CBC WITH DIFFERENTIAL/PLATELET
BASOS: 0 %
Basophils Absolute: 0 10*3/uL (ref 0.0–0.2)
EOS (ABSOLUTE): 0.1 10*3/uL (ref 0.0–0.4)
Eos: 1 %
Hematocrit: 40.8 % (ref 34.0–46.6)
Hemoglobin: 14 g/dL (ref 11.1–15.9)
IMMATURE GRANS (ABS): 0 10*3/uL (ref 0.0–0.1)
Immature Granulocytes: 0 %
LYMPHS ABS: 1.8 10*3/uL (ref 0.7–3.1)
LYMPHS: 27 %
MCH: 32.2 pg (ref 26.6–33.0)
MCHC: 34.3 g/dL (ref 31.5–35.7)
MCV: 94 fL (ref 79–97)
MONOS ABS: 0.5 10*3/uL (ref 0.1–0.9)
Monocytes: 8 %
NEUTROS ABS: 4.3 10*3/uL (ref 1.4–7.0)
Neutrophils: 64 %
PLATELETS: 221 10*3/uL (ref 150–379)
RBC: 4.35 x10E6/uL (ref 3.77–5.28)
RDW: 13 % (ref 12.3–15.4)
WBC: 6.8 10*3/uL (ref 3.4–10.8)

## 2017-08-20 NOTE — Patient Instructions (Addendum)
Medication Instructions:  Your physician recommends that you continue on your current medications as directed. Please refer to the Current Medication list given to you today.   Labwork: Your physician recommends that you return for lab work today: BMP/CBC   Testing/Procedures: Atrial lead revision with Dr Ladona Ridgelaylor on Mon 08/30/17  [facility] Okay to have a lite breakfast.  Nothing to eat or drink after 8:00am Do not take any medications the morning of the test Plan for one night stay Will need someone to drive you home at discharge     Follow-Up: Your physician recommends that you schedule a follow-up appointment in: 10-14 days from 08/30/17 in device clinic for wound check and 3 months form 08/30/17 with Dr Johney FrameAllred

## 2017-08-20 NOTE — Patient Instructions (Addendum)
Medication Instructions:  Your physician recommends that you continue on your current medications as directed. Please refer to the Current Medication list given to you today.   Labwork: None ordered  Testing/Procedures: None ordered  Follow-Up: Dr. Eldridge DaceVaranasi wants Dr. Johney FrameAllred to see you today and you have been added to his schedule today.  Your physician wants you to follow-up in: 1 year with Dr. Eldridge DaceVaranasi. You will receive a reminder letter in the mail two months in advance. If you don't receive a letter, please call our office to schedule the follow-up appointment.    Any Other Special Instructions Will Be Listed Below (If Applicable).     If you need a refill on your cardiac medications before your next appointment, please call your pharmacy.

## 2017-08-20 NOTE — Progress Notes (Signed)
PCP: Marden NobleGates, Robert, MD Primary Cardiologist: Primary EP:  Dr Johney FrameAllred  Kelly Castaneda is a 67 y.o. female who presents today for urgent add on electrophysiology followup.  She reports increasing SOB and fatigue over the past week.  She underwent chest X ray which reveals that her RA lead has dislodged.  She was seen earlier today by Dr Eldridge DaceVaranasi who refers her for urgent EP evaluation.  Today, she denies symptoms of palpitations, chest pain, lower extremity edema, dizziness, presyncope, or syncope.  The patient is otherwise without complaint today.   Past Medical History:  Diagnosis Date  . Arthritis    "wrists, right knee" (11/06/2016)  . Bell's palsy   . Chronic diastolic CHF (congestive heart failure) (HCC)    a. 11/2015 Echo: EF 50-55%, Gr1 DD, mildly dil LA.  Marland Kitchen. Complete heart block (HCC)    mobitz II second degree AV  . Essential hypertension   . Family history of adverse reaction to anesthesia    "had a little trouble waking my brother up; dx'd sleep apnea"  . GERD (gastroesophageal reflux disease)   . H/O hiatal hernia   . History of kidney stones   . Hypothyroidism    "not on RX" (11/06/2016)  . Migraine    "none in years" (11/06/2016)  . OSA on CPAP    "I wear nose pillows"  . Presence of permanent cardiac pacemaker   . Shingles   . Syncope    a. 11/2015 - subsequent event monitoring revealed CHF in setting of beta blocker therapy - resolved with discontinuation of coreg.   Past Surgical History:  Procedure Laterality Date  . BACK SURGERY    . BREAST BIOPSY Right 1990s   biopsy with titanium clip placement  . CARDIAC CATHETERIZATION     "years ago"  . CARDIAC CATHETERIZATION N/A 01/03/2016   Procedure: Temporary Pacemaker;  Surgeon: Lennette Biharihomas A Kelly, MD;  [facility];  Service: Cardiovascular;  Laterality: N/A;  . DILATION AND CURETTAGE OF UTERUS    . EP IMPLANTABLE DEVICE N/A 04/30/2016   SJM Assurity MRI PPM implanted for transient complete heart block by  Dr Johney FrameAllred  . INSERT / REPLACE / REMOVE PACEMAKER    . JOINT REPLACEMENT    . LAPAROSCOPIC CHOLECYSTECTOMY  ~ 1990  . LEAD REVISION  11/06/2016   pacemaker  . LEAD REVISION/REPAIR N/A 11/06/2016   Procedure: Lead Revision/Repair;  Surgeon: Hillis RangeJames Rondi Ivy, MD;  [facility]: [facility];  Service: Cardiovascular;  Laterality: N/A;  . LITHOTRIPSY  1990s   "I've had a couple of those" (11/06/2016)  . LUMBAR DISC SURGERY  ~ 2007  . PARATHYROIDECTOMY Right 1990s  . TOTAL KNEE ARTHROPLASTY Left 09/25/2013   Procedure: LEFT TOTAL KNEE ARTHROPLASTY;  Surgeon: Loanne DrillingFrank V Aluisio, MD;  Location: [facility];  Service: Orthopedics;  Laterality: Left;  . TUBAL LIGATION    . VAGINAL HYSTERECTOMY  1996    ROS- all systems are reviewed and negative except as per HPI above  Current Outpatient Medications  Medication Sig Dispense Refill  . amLODipine (NORVASC) 5 MG tablet Take 1 tablet (5 mg total) by mouth daily. 30 tablet 6  . aspirin EC 81 MG tablet Take 81 mg by mouth daily.    . carvedilol (COREG) 3.125 MG tablet Take 1 tablet (3.125 mg total) by mouth 2 (two) times daily with a meal. 180 tablet 3  . Cholecalciferol (VITAMIN D) 2000 UNITS tablet Take 2,000 Units by mouth daily.    .Marland Kitchen  Cyanocobalamin (VITAMIN B 12 PO) Take 100 mcg by mouth daily.    . furosemide (LASIX) 20 MG tablet Take 1 tablet (20 mg total) by mouth daily. 30 tablet 6  . losartan (COZAAR) 100 MG tablet Take 100 mg by mouth every morning.    . naproxen sodium (ANAPROX) 220 MG tablet Take 440 mg by mouth daily as needed (arthritis).    Marland Kitchen omeprazole (PRILOSEC) 20 MG capsule Take 20 mg by mouth daily. For heartburn     No current facility-administered medications for this visit.     Physical Exam: VS:  BP 128/72   Pulse 73   Ht 5\' 5"  (1.651 m)   Wt 244 lb 6.4 oz (110.9 kg)   SpO2 92%   BMI 40.67 kg/m  , BMI Body mass index is 40.67 kg/m. GEN- The patient is well appearing, alert and oriented x 3 today.   Head- normocephalic,  atraumatic Eyes-  Sclera clear, conjunctiva pink Ears- hearing intact Oropharynx- clear Lungs- Clear to ausculation bilaterally, normal work of breathing Chest- pacemaker pocket is well healed Heart- Regular rate and rhythm, no murmurs, rubs or gallops, PMI not laterally displaced GI- soft, NT, ND, + BS Extremities- no clubbing, cyanosis, or edema  Pacemaker interrogation- reviewed in detail today,  See PACEART report  ekg tracing ordered today is personally reviewed and shows atrial noncapture, V pacing, AV dissociation  CXR- personally reviewed images reveal that her RA lead is dislodged.  Her RV lead has very little slack but is not dislodged.  Assessment and Plan:  1. Symptomatic mobitz II second degree AV block Atrial lead threshold is increased. Though by Xray, the atrial lead is in the SVC, at higher output, it appears to capture atrial tissue.  I have therefore increased atrial lead pacing output today.  Though her RV lead threshold is stable,  I worry about risks of dislodgement of this lead also.  Today, she is device dependant.  She is also symptomatic with pacemaker syncope due to AV dissociation.  This is the likely cause for her recent SOB. I would advise PPM system revision at the next available time.  I actually advised admission to [facility] for close observation over the weekend as she is device dependant however she is clear in her decision to decline.  I have spoken with Dr Ladona Ridgel who is in the EP lab on Monday (next available time).  He is willing to assist with PPM system revision.  Risks, benefits, alternatives to pacemaker system revision were discussed in detail with the patient today. The patient understands that the risks include but are not limited to bleeding, infection, pneumothorax, perforation, tamponade, vascular damage, renal failure, MI, stroke, death,  and lead dislodgement and wishes to proceed.  She is therefore scheduled urgently for Monday with Dr Ladona Ridgel.   She is aware that should she have any dizziness, presyncope, or syncope that she should call 911 in the interim.  Very complicated patient with pacemaker system failure.  She is at risk for decompensation.  I have advised hospital admission which she declines.  A high level of decision making was required for this encounter.  Hillis Range MD, [facility] 08/20/2017 9:55 PM

## 2017-08-23 ENCOUNTER — Ambulatory Visit (HOSPITAL_COMMUNITY)
Admission: RE | Disposition: A | Discharge status: Home / Self Care | Payer: Self-pay | Source: Ambulatory Visit | Attending: Internal Medicine

## 2017-08-23 ENCOUNTER — Ambulatory Visit (HOSPITAL_COMMUNITY)
Admission: RE | Admit: 2017-08-23 | Discharge: 2017-08-24 | Disposition: A | Discharge status: Home / Self Care | Payer: Medicare HMO | Source: Ambulatory Visit | Attending: Internal Medicine | Admitting: Internal Medicine

## 2017-08-23 ENCOUNTER — Other Ambulatory Visit: Payer: Self-pay

## 2017-08-23 ENCOUNTER — Encounter (HOSPITAL_COMMUNITY): Payer: Self-pay

## 2017-08-23 DIAGNOSIS — I442 Atrioventricular block, complete: Secondary | ICD-10-CM | POA: Insufficient documentation

## 2017-08-23 DIAGNOSIS — K219 Gastro-esophageal reflux disease without esophagitis: Secondary | ICD-10-CM | POA: Insufficient documentation

## 2017-08-23 DIAGNOSIS — G4733 Obstructive sleep apnea (adult) (pediatric): Secondary | ICD-10-CM | POA: Insufficient documentation

## 2017-08-23 DIAGNOSIS — I5032 Chronic diastolic (congestive) heart failure: Secondary | ICD-10-CM | POA: Diagnosis not present

## 2017-08-23 DIAGNOSIS — Z7982 Long term (current) use of aspirin: Secondary | ICD-10-CM | POA: Insufficient documentation

## 2017-08-23 DIAGNOSIS — Y831 Surgical operation with implant of artificial internal device as the cause of abnormal reaction of the patient, or of later complication, without mention of misadventure at the time of the procedure: Secondary | ICD-10-CM | POA: Diagnosis not present

## 2017-08-23 DIAGNOSIS — T829XXA Unspecified complication of cardiac and vascular prosthetic device, implant and graft, initial encounter: Secondary | ICD-10-CM | POA: Diagnosis present

## 2017-08-23 DIAGNOSIS — T82120A Displacement of cardiac electrode, initial encounter: Secondary | ICD-10-CM

## 2017-08-23 DIAGNOSIS — T82128A Displacement of other cardiac electronic device, initial encounter: Secondary | ICD-10-CM | POA: Diagnosis present

## 2017-08-23 DIAGNOSIS — I441 Atrioventricular block, second degree: Secondary | ICD-10-CM | POA: Insufficient documentation

## 2017-08-23 DIAGNOSIS — Z950 Presence of cardiac pacemaker: Secondary | ICD-10-CM | POA: Diagnosis present

## 2017-08-23 DIAGNOSIS — E890 Postprocedural hypothyroidism: Secondary | ICD-10-CM | POA: Diagnosis not present

## 2017-08-23 DIAGNOSIS — Z79899 Other long term (current) drug therapy: Secondary | ICD-10-CM | POA: Diagnosis not present

## 2017-08-23 DIAGNOSIS — I110 Hypertensive heart disease with heart failure: Secondary | ICD-10-CM | POA: Insufficient documentation

## 2017-08-23 DIAGNOSIS — Z96652 Presence of left artificial knee joint: Secondary | ICD-10-CM | POA: Diagnosis not present

## 2017-08-23 HISTORY — PX: LEAD REVISION/REPAIR: EP1213

## 2017-08-23 LAB — SURGICAL PCR SCREEN
MRSA, PCR: NEGATIVE
Staphylococcus aureus: NEGATIVE

## 2017-08-23 SURGERY — LEAD REVISION/REPAIR
Anesthesia: LOCAL

## 2017-08-23 MED ORDER — LIDOCAINE HCL 1 % IJ SOLN
INTRAMUSCULAR | Status: AC
  Filled 2017-08-23: qty 80

## 2017-08-23 MED ORDER — SODIUM CHLORIDE 0.9 % IV SOLN
INTRAVENOUS | Status: DC
  Administered 2017-08-23: 12:00:00 via INTRAVENOUS

## 2017-08-23 MED ORDER — HEPARIN (PORCINE) IN NACL 2-0.9 UNIT/ML-% IJ SOLN
INTRAMUSCULAR | Status: AC
  Filled 2017-08-23: qty 500

## 2017-08-23 MED ORDER — CARVEDILOL 3.125 MG PO TABS
3.1250 mg | ORAL_TABLET | Freq: Two times a day (BID) | ORAL | Status: DC
  Administered 2017-08-23 – 2017-08-24 (×2): 3.125 mg via ORAL
  Filled 2017-08-23 (×2): qty 1

## 2017-08-23 MED ORDER — SODIUM CHLORIDE 0.9 % IR SOLN
Status: AC
  Filled 2017-08-23: qty 2

## 2017-08-23 MED ORDER — ONDANSETRON HCL 4 MG/2ML IJ SOLN: 4.0000 mg | Freq: Four times a day (QID) | INTRAMUSCULAR | Status: DC | PRN

## 2017-08-23 MED ORDER — HEPARIN (PORCINE) IN NACL 2-0.9 UNIT/ML-% IJ SOLN
INTRAMUSCULAR | Status: AC | PRN
  Administered 2017-08-23: 500 mL

## 2017-08-23 MED ORDER — CEFAZOLIN SODIUM-DEXTROSE 2-4 GM/100ML-% IV SOLN
2.0000 g | INTRAVENOUS | Status: AC
  Administered 2017-08-23: 2 g via INTRAVENOUS

## 2017-08-23 MED ORDER — LOSARTAN POTASSIUM 50 MG PO TABS
100.0000 mg | ORAL_TABLET | Freq: Every morning | ORAL | Status: DC
  Administered 2017-08-24: 100 mg via ORAL
  Filled 2017-08-23: qty 2

## 2017-08-23 MED ORDER — MUPIROCIN 2 % EX OINT
1.0000 "application " | TOPICAL_OINTMENT | Freq: Once | CUTANEOUS | Status: AC
  Administered 2017-08-23: 1 via TOPICAL

## 2017-08-23 MED ORDER — MUPIROCIN 2 % EX OINT
TOPICAL_OINTMENT | CUTANEOUS | Status: AC
  Administered 2017-08-23: 1 via TOPICAL
  Filled 2017-08-23: qty 22

## 2017-08-23 MED ORDER — ACETAMINOPHEN 325 MG PO TABS
325.0000 mg | ORAL_TABLET | ORAL | Status: DC | PRN
  Administered 2017-08-23 – 2017-08-24 (×2): 650 mg via ORAL
  Filled 2017-08-23 (×2): qty 2

## 2017-08-23 MED ORDER — LIDOCAINE HCL (PF) 1 % IJ SOLN
INTRAMUSCULAR | Status: DC | PRN
  Administered 2017-08-23: 80 mL

## 2017-08-23 MED ORDER — MIDAZOLAM HCL 5 MG/5ML IJ SOLN
INTRAMUSCULAR | Status: AC
  Filled 2017-08-23: qty 5

## 2017-08-23 MED ORDER — CEFAZOLIN SODIUM-DEXTROSE 2-4 GM/100ML-% IV SOLN
2.0000 g | Freq: Four times a day (QID) | INTRAVENOUS | Status: AC
  Administered 2017-08-23 – 2017-08-24 (×3): 2 g via INTRAVENOUS
  Filled 2017-08-23 (×3): qty 100

## 2017-08-23 MED ORDER — FUROSEMIDE 20 MG PO TABS
20.0000 mg | ORAL_TABLET | Freq: Every day | ORAL | Status: DC
  Administered 2017-08-24: 20 mg via ORAL
  Filled 2017-08-23: qty 1

## 2017-08-23 MED ORDER — ASPIRIN EC 81 MG PO TBEC
81.0000 mg | DELAYED_RELEASE_TABLET | Freq: Every day | ORAL | Status: DC
  Administered 2017-08-23: 81 mg via ORAL
  Filled 2017-08-23: qty 1

## 2017-08-23 MED ORDER — NAPROXEN 500 MG PO TABS
500.0000 mg | ORAL_TABLET | Freq: Every day | ORAL | Status: DC | PRN
  Filled 2017-08-23: qty 1

## 2017-08-23 MED ORDER — GENTAMICIN SULFATE 40 MG/ML IJ SOLN
80.0000 mg | INTRAMUSCULAR | Status: AC
  Administered 2017-08-23: 80 mg

## 2017-08-23 MED ORDER — VITAMIN D 1000 UNITS PO TABS
2000.0000 [IU] | ORAL_TABLET | Freq: Every day | ORAL | Status: DC
  Administered 2017-08-24: 2000 [IU] via ORAL
  Filled 2017-08-23: qty 2

## 2017-08-23 MED ORDER — CHLORHEXIDINE GLUCONATE 4 % EX LIQD
60.0000 mL | Freq: Once | CUTANEOUS | Status: DC
  Filled 2017-08-23: qty 60

## 2017-08-23 MED ORDER — AMLODIPINE BESYLATE 5 MG PO TABS
5.0000 mg | ORAL_TABLET | Freq: Every day | ORAL | Status: DC
  Administered 2017-08-24: 5 mg via ORAL
  Filled 2017-08-23: qty 1

## 2017-08-23 MED ORDER — CEFAZOLIN SODIUM-DEXTROSE 2-4 GM/100ML-% IV SOLN
INTRAVENOUS | Status: AC
  Filled 2017-08-23: qty 100

## 2017-08-23 MED ORDER — MIDAZOLAM HCL 5 MG/5ML IJ SOLN
INTRAMUSCULAR | Status: DC | PRN
  Administered 2017-08-23 (×8): 1 mg via INTRAVENOUS

## 2017-08-23 SURGICAL SUPPLY — 7 items
CABLE SURGICAL S-101-97-12 (CABLE) ×1 IMPLANT
KIT MICROINTRODUCER STIFF 5F (SHEATH) ×1 IMPLANT
LEAD TENDRIL MRI 52CM LPA1200M (Lead) ×1 IMPLANT
LEAD TENDRIL MRI 58CM LPA1200M (Lead) ×1 IMPLANT
PAD DEFIB LIFELINK (PAD) ×1 IMPLANT
SHEATH CLASSIC 8F (SHEATH) ×2 IMPLANT
TRAY PACEMAKER INSERTION (PACKS) ×1 IMPLANT

## 2017-08-23 NOTE — Discharge Summary (Signed)
ELECTROPHYSIOLOGY PROCEDURE DISCHARGE SUMMARY    Patient ID: Kelly Castaneda,  MRN: [id number], DOB/AGE: June 25, 1951 67 y.o.  Admit date: 08/23/2017 Discharge date: 08/24/17  Primary Care Physician: Marden NobleGates, Robert, MD  Primary Cardiologist: Dr. Eldridge DaceVaranasi Electrophysiologist: Dr. Johney FrameAllred  Primary Discharge Diagnosis:  1. Pacemaker Lead dislodgement 2. Mobitz II heart block  Secondary Discharge Diagnosis:  1. HTN 2. Hypothyroidism  Allergies  Allergen Reactions  . Codeine Nausea And Vomiting  . Fentanyl Nausea And Vomiting  . Morphine And Related Nausea And Vomiting     Procedures This Admission:  1.  Implantation of a new A and V pacemaker leads, extraction of old leads, on 08/23/17 by Dr Ladona Ridgelaylor.  See OP report for full details. There were no immediate post procedure complications. 2.  CXR on 08/24/17 did not describe a pneumothorax status post device implantation.   Brief HPI: Kelly SkeensLoretta H Oki is a 67 y.o. female was noted oput patient to have developed RA lead dislodgement.  Past medical history includes High degree AVBlock, HTN, Hypothyroidism.  Risks, benefits, and alternatives to PPM lead revision were reviewed with the patient who wished to proceed.   Hospital Course:  The patient was admitted and underwent implantation of new RA/RV pacing leads, and extraction of old leads with details as outlined above.  She was was monitored on telemetry overnight which demonstrated SR/VP.  Left chest was without hematoma or ecchymosis.  The device was interrogated and found to be functioning normally.  CXR was obtained and no pneumothorax was described status post device implantation.  Wound care, arm mobility, and restrictions were reviewed with the patient.  The patient was examined by Dr. Johney FrameAllred and considered stable for discharge to home.    Physical Exam: Vitals:   08/23/17 2230 08/24/17 0011 08/24/17 0439 08/24/17 0725  BP: (!) 119/57 (!) 121/52 133/65 139/68  Pulse: 60 65 67  68  Resp:  18 18 18   Temp:  98.3 F (36.8 C) 98 F (36.7 C) 97.7 F (36.5 C)  TempSrc:  Oral Oral Oral  SpO2: 97% 97% 97% 94%  Weight:   242 lb 8 oz (110 kg)   Height:        GEN- The patient is well appearing, alert and oriented x 3 today.   HEENT: normocephalic, atraumatic; sclera clear, conjunctiva pink; hearing intact; oropharynx clear; neck supple, no JVP Lungs- CTA b/l, normal work of breathing.  No wheezes, rales, rhonchi Heart- RRR, no murmurs, rubs or gallops, PMI not laterally displaced GI- soft, non-tender, non-distended Extremities- no clubbing, cyanosis, or edema MS- no significant deformity or atrophy Skin- warm and dry, no rash or lesion, left chest without hematoma/ecchymosis Psych- euthymic mood, full affect Neuro- no gross deficits   Labs:   Lab Results  Component Value Date   WBC 6.8 08/20/2017   HGB 14.0 08/20/2017   HCT 40.8 08/20/2017   MCV 94 08/20/2017   PLT 221 08/20/2017    Recent Labs  Lab 08/20/17 1036  NA 143  K 4.4  CL 101  CO2 23  BUN 17  CREATININE 0.88  CALCIUM 10.1  GLUCOSE 116*    Discharge Medications:  Allergies as of 08/24/2017      Reactions   Codeine Nausea And Vomiting   Fentanyl Nausea And Vomiting   Morphine And Related Nausea And Vomiting      Medication List    TAKE these medications   amLODipine 5 MG tablet Commonly known as:  NORVASC Take 1 tablet (  5 mg total) by mouth daily.   aspirin EC 81 MG tablet Take 81 mg by mouth daily.   carvedilol 3.125 MG tablet Commonly known as:  COREG Take 1 tablet (3.125 mg total) by mouth 2 (two) times daily with a meal.   furosemide 20 MG tablet Commonly known as:  LASIX Take 1 tablet (20 mg total) by mouth daily.   losartan 100 MG tablet Commonly known as:  COZAAR Take 100 mg by mouth every morning.   naproxen sodium 220 MG tablet Commonly known as:  ALEVE Take 440 mg by mouth daily as needed (arthritis).   omeprazole 20 MG capsule Commonly known as:   PRILOSEC Take 20 mg by mouth daily. For heartburn   VITAMIN B 12 PO Take 100 mcg by mouth daily.   Vitamin D 2000 units tablet Take 2,000 Units by mouth daily.       Disposition: Home  Discharge Instructions    Diet - low sodium heart healthy   Complete by:  As directed    Increase activity slowly   Complete by:  As directed      Follow-up Information    [facility] Office Follow up on 09/02/2017.   Specialty:  Cardiology Why:  10:30AM, wound check visit Contact information: [address] [place] [address] [phone]       Hillis Range, MD Follow up on 12/13/2017.   Specialty:  Cardiology Why:  10:00AM Contact information: [address] [place] [address] [phone]           Duration of Discharge Encounter: Greater than 30 minutes including physician time.  Norma Fredrickson, PA-C 08/24/2017 8:40 AM

## 2017-08-23 NOTE — H&P (Signed)
PCP: Marden NobleGates, Robert, MD Primary Cardiologist: Primary EP:  Dr Johney FrameAllred  Kelly Castaneda is a 67 y.o. female who presents today for urgent add on electrophysiology followup.  She reports increasing SOB and fatigue over the past week.  She underwent chest X ray which reveals that her RA lead has dislodged.  She was seen earlier today by Dr Eldridge DaceVaranasi who refers her for urgent EP evaluation.  Today, she denies symptoms of palpitations, chest pain, lower extremity edema, dizziness, presyncope, or syncope.  The patient is otherwise without complaint today.       Past Medical History:  Diagnosis Date  . Arthritis    "wrists, right knee" (11/06/2016)  . Bell's palsy   . Chronic diastolic CHF (congestive heart failure) (HCC)    a. 11/2015 Echo: EF 50-55%, Gr1 DD, mildly dil LA.  Marland Kitchen. Complete heart block (HCC)    mobitz II second degree AV  . Essential hypertension   . Family history of adverse reaction to anesthesia    "had a little trouble waking my brother up; dx'd sleep apnea"  . GERD (gastroesophageal reflux disease)   . H/O hiatal hernia   . History of kidney stones   . Hypothyroidism    "not on RX" (11/06/2016)  . Migraine    "none in years" (11/06/2016)  . OSA on CPAP    "I wear nose pillows"  . Presence of permanent cardiac pacemaker   . Shingles   . Syncope    a. 11/2015 - subsequent event monitoring revealed CHF in setting of beta blocker therapy - resolved with discontinuation of coreg.        Past Surgical History:  Procedure Laterality Date  . BACK SURGERY    . BREAST BIOPSY Right 1990s   biopsy with titanium clip placement  . CARDIAC CATHETERIZATION     "years ago"  . CARDIAC CATHETERIZATION N/A 01/03/2016   Procedure: Temporary Pacemaker;  Surgeon: Lennette Biharihomas A Kelly, MD;  [facility];  Service: Cardiovascular;  Laterality: N/A;  . DILATION AND CURETTAGE OF UTERUS    . EP IMPLANTABLE DEVICE N/A 04/30/2016   SJM Assurity MRI PPM  implanted for transient complete heart block by Dr Johney FrameAllred  . INSERT / REPLACE / REMOVE PACEMAKER    . JOINT REPLACEMENT    . LAPAROSCOPIC CHOLECYSTECTOMY  ~ 1990  . LEAD REVISION  11/06/2016   pacemaker  . LEAD REVISION/REPAIR N/A 11/06/2016   Procedure: Lead Revision/Repair;  Surgeon: Hillis RangeJames Allred, MD;  Location: [facility];  Service: Cardiovascular;  Laterality: N/A;  . LITHOTRIPSY  1990s   "I've had a couple of those" (11/06/2016)  . LUMBAR DISC SURGERY  ~ 2007  . PARATHYROIDECTOMY Right 1990s  . TOTAL KNEE ARTHROPLASTY Left 09/25/2013   Procedure: LEFT TOTAL KNEE ARTHROPLASTY;  Surgeon: Loanne DrillingFrank V Aluisio, MD;  Location: [facility];  Service: Orthopedics;  Laterality: Left;  . TUBAL LIGATION    . VAGINAL HYSTERECTOMY  1996    ROS- all systems are reviewed and negative except as per HPI above        Current Outpatient Medications  Medication Sig Dispense Refill  . amLODipine (NORVASC) 5 MG tablet Take 1 tablet (5 mg total) by mouth daily. 30 tablet 6  . aspirin EC 81 MG tablet Take 81 mg by mouth daily.    . carvedilol (COREG) 3.125 MG tablet Take 1 tablet (3.125 mg total) by mouth 2 (two) times daily with a meal. 180 tablet 3  . Cholecalciferol (  VITAMIN D) 2000 UNITS tablet Take 2,000 Units by mouth daily.    . Cyanocobalamin (VITAMIN B 12 PO) Take 100 mcg by mouth daily.    . furosemide (LASIX) 20 MG tablet Take 1 tablet (20 mg total) by mouth daily. 30 tablet 6  . losartan (COZAAR) 100 MG tablet Take 100 mg by mouth every morning.    . naproxen sodium (ANAPROX) 220 MG tablet Take 440 mg by mouth daily as needed (arthritis).    Marland Kitchen omeprazole (PRILOSEC) 20 MG capsule Take 20 mg by mouth daily. For heartburn     No current facility-administered medications for this visit.     Physical Exam: VS:BP 128/72  Pulse 73  Ht 5\' 5"  (1.651 m)  Wt 244 lb 6.4 oz (110.9 kg)  SpO2 92%  BMI 40.67 kg/m , BMI Body mass index is 40.67 kg/m. GEN- The  patient is well appearing, alert and oriented x 3 today.   Head- normocephalic, atraumatic Eyes-  Sclera clear, conjunctiva pink Ears- hearing intact Oropharynx- clear Lungs- Clear to ausculation bilaterally, normal work of breathing Chest- pacemaker pocket is well healed Heart- Regular rate and rhythm, no murmurs, rubs or gallops, PMI not laterally displaced GI- soft, NT, ND, + BS Extremities- no clubbing, cyanosis, or edema  Pacemaker interrogation- reviewed in detail today,  See PACEART report  ekg tracing ordered today is personally reviewed and shows atrial noncapture, V pacing, AV dissociation  CXR- personally reviewed images reveal that her RA lead is dislodged.  Her RV lead has very little slack but is not dislodged.  Assessment and Plan:  1. Symptomatic mobitz II second degree AV block Atrial lead threshold is increased. Though by Xray, the atrial lead is in the SVC, at higher output, it appears to capture atrial tissue.  I have therefore increased atrial lead pacing output today.  Though her RV lead threshold is stable,  I worry about risks of dislodgement of this lead also.  Today, she is device dependant.  She is also symptomatic with pacemaker syncope due to AV dissociation.  This is the likely cause for her recent SOB. I would advise PPM system revision at the next available time.  I actually advised admission to [facility] for close observation over the weekend as she is device dependant however she is clear in her decision to decline.  I have spoken with Dr Ladona Ridgel who is in the EP lab on Monday (next available time).  He is willing to assist with PPM system revision.  Risks, benefits, alternatives to pacemaker system revision were discussed in detail with the patient today. The patient understands that the risks include but are not limited to bleeding, infection, pneumothorax, perforation, tamponade, vascular damage, renal failure, MI, stroke, death,  and lead dislodgement  and wishes to proceed.  She is therefore scheduled urgently for Monday with Dr Ladona Ridgel.  She is aware that should she have any dizziness, presyncope, or syncope that she should call 911 in the interim.  Very complicated patient with pacemaker system failure.  She is at risk for decompensation.  I have advised hospital admission which she declines.  A high level of decision making was required for this encounter.  Hillis Range MD, [facility] 08/20/2017 9:55 PM   EP Attending  Patient seen and examined. I have discussed the situation with Dr. Johney Frame. She has had 2 lead dislodgements and I have been asked to assist with placing a new system in hopes of obtaining an acceptable pacing threshold and lead  stability. My plan would be to place a larger pacing lead in both the atria and ventricles and bury the new device under the pectoraliz major. I have discussed the indications/risks/benefits/goals/expectations of the procedure with the patient and she wishes to proceed.  Leonia Reeves.D.

## 2017-08-24 ENCOUNTER — Ambulatory Visit (HOSPITAL_COMMUNITY): Payer: Medicare HMO

## 2017-08-24 ENCOUNTER — Encounter (HOSPITAL_COMMUNITY): Payer: Self-pay | Admitting: Internal Medicine

## 2017-08-24 DIAGNOSIS — I110 Hypertensive heart disease with heart failure: Secondary | ICD-10-CM | POA: Diagnosis not present

## 2017-08-24 DIAGNOSIS — I442 Atrioventricular block, complete: Secondary | ICD-10-CM | POA: Diagnosis not present

## 2017-08-24 DIAGNOSIS — T82128A Displacement of other cardiac electronic device, initial encounter: Secondary | ICD-10-CM | POA: Diagnosis not present

## 2017-08-24 DIAGNOSIS — E890 Postprocedural hypothyroidism: Secondary | ICD-10-CM | POA: Diagnosis not present

## 2017-08-24 DIAGNOSIS — Z96652 Presence of left artificial knee joint: Secondary | ICD-10-CM | POA: Diagnosis not present

## 2017-08-24 DIAGNOSIS — Y831 Surgical operation with implant of artificial internal device as the cause of abnormal reaction of the patient, or of later complication, without mention of misadventure at the time of the procedure: Secondary | ICD-10-CM | POA: Diagnosis not present

## 2017-08-24 DIAGNOSIS — I441 Atrioventricular block, second degree: Secondary | ICD-10-CM | POA: Diagnosis not present

## 2017-08-24 DIAGNOSIS — Z7982 Long term (current) use of aspirin: Secondary | ICD-10-CM | POA: Diagnosis not present

## 2017-08-24 DIAGNOSIS — J9811 Atelectasis: Secondary | ICD-10-CM | POA: Diagnosis not present

## 2017-08-24 DIAGNOSIS — I5032 Chronic diastolic (congestive) heart failure: Secondary | ICD-10-CM | POA: Diagnosis not present

## 2017-08-24 DIAGNOSIS — Z79899 Other long term (current) drug therapy: Secondary | ICD-10-CM | POA: Diagnosis not present

## 2017-08-24 DIAGNOSIS — G4733 Obstructive sleep apnea (adult) (pediatric): Secondary | ICD-10-CM | POA: Diagnosis not present

## 2017-08-24 NOTE — Progress Notes (Signed)
Pt is alert and oriented D/C with stable vitals iv out and tele off CCMD called.

## 2017-08-24 NOTE — Plan of Care (Signed)
  Progressing Health Behavior/Discharge Planning: Ability to manage health-related needs will improve 08/24/2017 0103 - Progressing by Elnita Maxwellodoo, Briannah Lona A, RN Clinical Measurements: Ability to maintain clinical measurements within normal limits will improve 08/24/2017 0103 - Progressing by Elnita Maxwellodoo, Allyana Vogan A, RN Will remain free from infection 08/24/2017 0103 - Progressing by Elnita Maxwellodoo, Anna-Marie Coller A, RN Cardiovascular complication will be avoided 08/24/2017 0103 - Progressing by Elnita Maxwellodoo, Snow Peoples A, RN

## 2017-08-24 NOTE — Discharge Instructions (Signed)
° ° °  Supplemental Discharge Instructions for  Pacemaker/Defibrillator Patients  Activity No heavy lifting or vigorous activity with your left/right arm for 6 to 8 weeks.  Do not raise your left/right arm above your head for one week.  Gradually raise your affected arm as drawn below.             08/27/17                     08/28/17                    08/29/17                   08/30/17 __  NO DRIVING for  1 week  ; you may begin driving on  1/61/091/28/19   .  WOUND CARE - Keep the wound area clean and dry.  Do not get this area wet, no showers until cleared to at your wound check visit. - The tape/steri-strips on your wound will fall off; do not pull them off.  No bandage is needed on the site.  DO  NOT apply any creams, oils, or ointments to the wound area. - If you notice any drainage or discharge from the wound, any swelling or bruising at the site, or you develop a fever > 101? F after you are discharged home, call the office at once.  Special Instructions - You are still able to use cellular telephones; use the ear opposite the side where you have your pacemaker/defibrillator.  Avoid carrying your cellular phone near your device. - When traveling through airports, show security personnel your identification card to avoid being screened in the metal detectors.  Ask the security personnel to use the hand wand. - Avoid arc welding equipment, MRI testing (magnetic resonance imaging), TENS units (transcutaneous nerve stimulators).  Call the office for questions about other devices. - Avoid electrical appliances that are in poor condition or are not properly grounded. - Microwave ovens are safe to be near or to operate.  Additional information for defibrillator patients should your device go off: - If your device goes off ONCE and you feel fine afterward, notify the device clinic nurses. - If your device goes off ONCE and you do not feel well afterward, call 911. - If your device goes off TWICE,  call 911. - If your device goes off THREE times in one day, call 911.  DO NOT DRIVE YOURSELF OR A FAMILY MEMBER WITH A DEFIBRILLATOR TO THE HOSPITAL--CALL 911.

## 2017-08-24 NOTE — Progress Notes (Signed)
Doing well s/p PPM system revision CXR reveals stable leads, no ptx Device interrogation is personally reviewed and normal  DC to home Routine wound care and follow-up  Romeo Zielinski MD, [facility] 08/24/2017 7:56 AM

## 2017-08-24 NOTE — Progress Notes (Signed)
[facility] [employer] [facility] [place]

## 2017-08-27 MED FILL — Lidocaine HCl Local Inj 1%: INTRAMUSCULAR | Qty: 80 | Status: AC

## 2017-09-02 ENCOUNTER — Ambulatory Visit (INDEPENDENT_AMBULATORY_CARE_PROVIDER_SITE_OTHER): Payer: Medicare HMO | Admitting: *Deleted

## 2017-09-02 DIAGNOSIS — I442 Atrioventricular block, complete: Secondary | ICD-10-CM | POA: Diagnosis not present

## 2017-09-02 LAB — CUP PACEART INCLINIC DEVICE CHECK
Battery Remaining Longevity: 64 mo
Battery Voltage: 2.99 V
Brady Statistic RA Percent Paced: 55 %
Date Time Interrogation Session: 20190131104341
Implantable Lead Implant Date: 20170928
Implantable Lead Location: 753860
Implantable Pulse Generator Implant Date: 20170928
Lead Channel Pacing Threshold Amplitude: 0.5 V
Lead Channel Pacing Threshold Amplitude: 0.5 V
Lead Channel Pacing Threshold Amplitude: 0.5 V
Lead Channel Pacing Threshold Amplitude: 0.5 V
Lead Channel Pacing Threshold Pulse Width: 0.5 ms
Lead Channel Setting Pacing Pulse Width: 0.5 ms
MDC IDC LEAD IMPLANT DT: 20170928
MDC IDC LEAD LOCATION: 753859
MDC IDC MSMT LEADCHNL RA IMPEDANCE VALUE: 412.5 Ohm
MDC IDC MSMT LEADCHNL RA PACING THRESHOLD PULSEWIDTH: 0.5 ms
MDC IDC MSMT LEADCHNL RA SENSING INTR AMPL: 3.4 mV
MDC IDC MSMT LEADCHNL RV IMPEDANCE VALUE: 637.5 Ohm
MDC IDC MSMT LEADCHNL RV PACING THRESHOLD PULSEWIDTH: 0.5 ms
MDC IDC MSMT LEADCHNL RV PACING THRESHOLD PULSEWIDTH: 0.5 ms
MDC IDC MSMT LEADCHNL RV SENSING INTR AMPL: 12 mV
MDC IDC SET LEADCHNL RA PACING AMPLITUDE: 3.5 V
MDC IDC SET LEADCHNL RV PACING AMPLITUDE: 3.5 V
MDC IDC SET LEADCHNL RV SENSING SENSITIVITY: 4 mV
MDC IDC STAT BRADY RV PERCENT PACED: 99.9 %
Pulse Gen Model: 2272
Pulse Gen Serial Number: 7950948

## 2017-09-02 NOTE — Progress Notes (Signed)
Wound check appointment. Steri-strips removed. Wound without redness or edema. Incision edges approximated, wound well healed. Normal device function. Thresholds, sensing, and impedances consistent with implant measurements. Device programmed at 3.5V programmed on for extra safety margin until 3 month visit. Histogram distribution appropriate for patient and level of activity. AMS<30sec. No high ventricular rates noted. Patient educated about wound care, arm mobility, lifting restrictions. ROV 12/13/17 w/ JA

## 2017-09-09 ENCOUNTER — Ambulatory Visit: Payer: Medicare HMO

## 2017-11-22 DIAGNOSIS — M1711 Unilateral primary osteoarthritis, right knee: Secondary | ICD-10-CM | POA: Insufficient documentation

## 2017-11-22 DIAGNOSIS — Z96652 Presence of left artificial knee joint: Secondary | ICD-10-CM | POA: Insufficient documentation

## 2017-11-23 DIAGNOSIS — M1712 Unilateral primary osteoarthritis, left knee: Secondary | ICD-10-CM | POA: Diagnosis not present

## 2017-11-23 DIAGNOSIS — M1711 Unilateral primary osteoarthritis, right knee: Secondary | ICD-10-CM | POA: Diagnosis not present

## 2017-11-24 DIAGNOSIS — G4733 Obstructive sleep apnea (adult) (pediatric): Secondary | ICD-10-CM | POA: Diagnosis not present

## 2017-12-13 ENCOUNTER — Ambulatory Visit: Payer: Medicare HMO | Admitting: Internal Medicine

## 2017-12-13 VITALS — BP 132/88 | HR 81 | Ht 64.0 in | Wt 241.0 lb

## 2017-12-13 DIAGNOSIS — I441 Atrioventricular block, second degree: Secondary | ICD-10-CM | POA: Diagnosis not present

## 2017-12-13 DIAGNOSIS — I10 Essential (primary) hypertension: Secondary | ICD-10-CM | POA: Diagnosis not present

## 2017-12-13 DIAGNOSIS — Z950 Presence of cardiac pacemaker: Secondary | ICD-10-CM

## 2017-12-13 LAB — CUP PACEART INCLINIC DEVICE CHECK
Battery Remaining Longevity: 124 mo
Date Time Interrogation Session: 20190513105718
Implantable Lead Implant Date: 20190121
Implantable Lead Location: 753859
Implantable Lead Location: 753860
Implantable Pulse Generator Implant Date: 20170928
Lead Channel Pacing Threshold Amplitude: 0.625 V
Lead Channel Pacing Threshold Amplitude: 0.75 V
Lead Channel Pacing Threshold Pulse Width: 0.5 ms
Lead Channel Pacing Threshold Pulse Width: 0.5 ms
Lead Channel Setting Pacing Amplitude: 2 V
Lead Channel Setting Pacing Pulse Width: 0.5 ms
Lead Channel Setting Sensing Sensitivity: 4 mV
MDC IDC LEAD IMPLANT DT: 20190121
MDC IDC MSMT BATTERY VOLTAGE: 2.98 V
MDC IDC MSMT LEADCHNL RA IMPEDANCE VALUE: 400 Ohm
MDC IDC MSMT LEADCHNL RA PACING THRESHOLD AMPLITUDE: 0.75 V
MDC IDC MSMT LEADCHNL RA PACING THRESHOLD PULSEWIDTH: 0.5 ms
MDC IDC MSMT LEADCHNL RA PACING THRESHOLD PULSEWIDTH: 0.5 ms
MDC IDC MSMT LEADCHNL RA SENSING INTR AMPL: 2.4 mV
MDC IDC MSMT LEADCHNL RV IMPEDANCE VALUE: 562.5 Ohm
MDC IDC MSMT LEADCHNL RV PACING THRESHOLD AMPLITUDE: 0.75 V
MDC IDC PG SERIAL: 7950948
MDC IDC SET LEADCHNL RV PACING AMPLITUDE: 0.875
MDC IDC STAT BRADY RA PERCENT PACED: 23 %
MDC IDC STAT BRADY RV PERCENT PACED: 99.9 %

## 2017-12-13 NOTE — Patient Instructions (Addendum)
Medication Instructions:  Your physician recommends that you continue on your current medications as directed. Please refer to the Current Medication list given to you today.  Labwork: None ordered.  Testing/Procedures: None ordered.  Follow-Up: Your physician wants you to follow-up in: 6 months with Gypsy Balsam, NP.   You will receive a reminder letter in the mail two months in advance. If you don't receive a letter, please call our office to schedule the follow-up appointment.  Remote monitoring is used to monitor your Pacemaker from home. This monitoring reduces the number of office visits required to check your device to one time per year. It allows [place] to keep an eye on the functioning of your device to ensure it is working properly. You are scheduled for a device check from home on 03/14/2018. You may send your transmission at any time that day. If you have a wireless device, the transmission will be sent automatically. After your physician reviews your transmission, you will receive a postcard with your next transmission date.  Any Other Special Instructions Will Be Listed Below (If Applicable).  If you need a refill on your cardiac medications before your next appointment, please call your pharmacy.

## 2017-12-13 NOTE — Progress Notes (Signed)
PCP: Marden Noble, MD Primary Cardiologist: Dr Eldridge Dace Primary EP:  Dr Johney Frame  LIKISHA ALLES is a 67 y.o. female who presents today for routine electrophysiology followup.  Since last being seen in our clinic, the patient reports doing very well.  Today, she denies symptoms of palpitations, chest pain, shortness of breath,  lower extremity edema, dizziness, presyncope, or syncope.  The patient is otherwise without complaint today.   Past Medical History:  Diagnosis Date  . Arthritis    "wrists, right knee" (11/06/2016)  . Bell's palsy   . Chronic diastolic CHF (congestive heart failure) (HCC)    a. 11/2015 Echo: EF 50-55%, Gr1 DD, mildly dil LA.  Marland Kitchen Complete heart block (HCC)    mobitz II second degree AV  . Essential hypertension   . Family history of adverse reaction to anesthesia    "had a little trouble waking my brother up; dx'd sleep apnea"  . GERD (gastroesophageal reflux disease)   . H/O hiatal hernia   . History of kidney stones   . Hypothyroidism    "not on RX" (11/06/2016)  . Migraine    "none in years" (11/06/2016)  . OSA on CPAP    "I wear nose pillows"  . Presence of permanent cardiac pacemaker   . Shingles   . Syncope    a. 11/2015 - subsequent event monitoring revealed CHF in setting of beta blocker therapy - resolved with discontinuation of coreg.   Past Surgical History:  Procedure Laterality Date  . BACK SURGERY    . BREAST BIOPSY Right 1990s   biopsy with titanium clip placement  . CARDIAC CATHETERIZATION     "years ago"  . CARDIAC CATHETERIZATION N/A 01/03/2016   Procedure: Temporary Pacemaker;  Surgeon: Lennette Bihari, MD;  Location: [facility];  Service: Cardiovascular;  Laterality: N/A;  . DILATION AND CURETTAGE OF UTERUS    . EP IMPLANTABLE DEVICE N/A 04/30/2016   SJM Assurity MRI PPM implanted for transient complete heart block by Dr Johney Frame  . INSERT / REPLACE / REMOVE PACEMAKER    . JOINT REPLACEMENT    . LAPAROSCOPIC CHOLECYSTECTOMY  ~  1990  . LEAD REVISION  11/06/2016   pacemaker  . LEAD REVISION/REPAIR N/A 11/06/2016   Procedure: Lead Revision/Repair;  Surgeon: Hillis Range, MD;  Location: [facility];  Service: Cardiovascular;  Laterality: N/A;  . LEAD REVISION/REPAIR N/A 08/23/2017   Procedure: LEAD REVISION/REPAIR;  Surgeon: Marinus Maw, MD;  Location: [facility];  Service: Cardiovascular;  Laterality: N/A;  . LITHOTRIPSY  1990s   "I've had a couple of those" (11/06/2016)  . LUMBAR DISC SURGERY  ~ 2007  . PARATHYROIDECTOMY Right 1990s  . TOTAL KNEE ARTHROPLASTY Left 09/25/2013   Procedure: LEFT TOTAL KNEE ARTHROPLASTY;  Surgeon: [name], MD;  Location: [facility];  Service: Orthopedics;  Laterality: Left;  . TUBAL LIGATION    . VAGINAL HYSTERECTOMY  1996    ROS- all systems are reviewed and negative except as per HPI above  Current Outpatient Medications  Medication Sig Dispense Refill  . amLODipine (NORVASC) 5 MG tablet Take 1 tablet (5 mg total) by mouth daily. 30 tablet 6  . aspirin EC 81 MG tablet Take 81 mg by mouth daily.    . carvedilol (COREG) 3.125 MG tablet Take 1 tablet (3.125 mg total) by mouth 2 (two) times daily with a meal. 180 tablet 3  . Cholecalciferol (VITAMIN D) 2000 UNITS tablet Take 2,000  Units by mouth daily.    . Cyanocobalamin (VITAMIN B 12 PO) Take 100 mcg by mouth daily.    . furosemide (LASIX) 20 MG tablet Take 1 tablet (20 mg total) by mouth daily. 30 tablet 6  . losartan (COZAAR) 100 MG tablet Take 100 mg by mouth every morning.    . naproxen sodium (ANAPROX) 220 MG tablet Take 440 mg by mouth daily as needed (arthritis).    Marland Kitchen omeprazole (PRILOSEC) 20 MG capsule Take 20 mg by mouth daily. For heartburn     No current facility-administered medications for this visit.     Physical Exam: Vitals:   12/13/17 1036  BP: 132/88  Pulse: 81  Weight: 241 lb (109.3 kg)  Height:  (1.626 m)    GEN- The patient is well appearing, alert and oriented x 3 today.     Head- normocephalic, atraumatic Eyes-  Sclera clear, conjunctiva pink Ears- hearing intact Oropharynx- clear Lungs- Clear to ausculation bilaterally, normal work of breathing Chest- pacemaker pocket is well healed Heart- Regular rate and rhythm, no murmurs, rubs or gallops, PMI not laterally displaced GI- soft, NT, ND, + BS Extremities- no clubbing, cyanosis, or edema  Pacemaker interrogation- reviewed in detail today,  See PACEART report  ekg tracing ordered today is personally reviewed and shows A sensed, V  Paced rhythm  Assessment and Plan:  1. Symptomatic mobitz II second degree heart block Device dependant (complete heart block) today Normal pacemaker function following second lead revision by Dr Ladona Ridgel 08/23/17 See Pace Art report No changes today  2. HTN Stable No change required today  3. Chronic diastolic dysfunction Stable No change required today  Merlin Return to see EP NP in 6 months  Hillis Range MD, [facility] 12/13/2017 10:38 AM

## 2018-01-06 DIAGNOSIS — M1711 Unilateral primary osteoarthritis, right knee: Secondary | ICD-10-CM | POA: Diagnosis not present

## 2018-02-01 ENCOUNTER — Other Ambulatory Visit: Payer: Self-pay | Admitting: Internal Medicine

## 2018-02-01 DIAGNOSIS — Z1231 Encounter for screening mammogram for malignant neoplasm of breast: Secondary | ICD-10-CM

## 2018-02-18 DIAGNOSIS — M1711 Unilateral primary osteoarthritis, right knee: Secondary | ICD-10-CM | POA: Diagnosis not present

## 2018-03-14 ENCOUNTER — Ambulatory Visit (INDEPENDENT_AMBULATORY_CARE_PROVIDER_SITE_OTHER): Payer: Medicare HMO | Admitting: *Deleted

## 2018-03-14 DIAGNOSIS — I441 Atrioventricular block, second degree: Secondary | ICD-10-CM

## 2018-03-15 ENCOUNTER — Encounter: Payer: Self-pay | Admitting: Cardiology

## 2018-03-15 IMAGING — DX DG CHEST 2V
2 series · 2 of 2 positions shown · non-contrast
Comparison: Chest radiograph performed 12/01/2014

CLINICAL DATA: Acute onset of syncope.  Initial encounter.

EXAM:
CHEST  2 VIEW

[w chest lat]
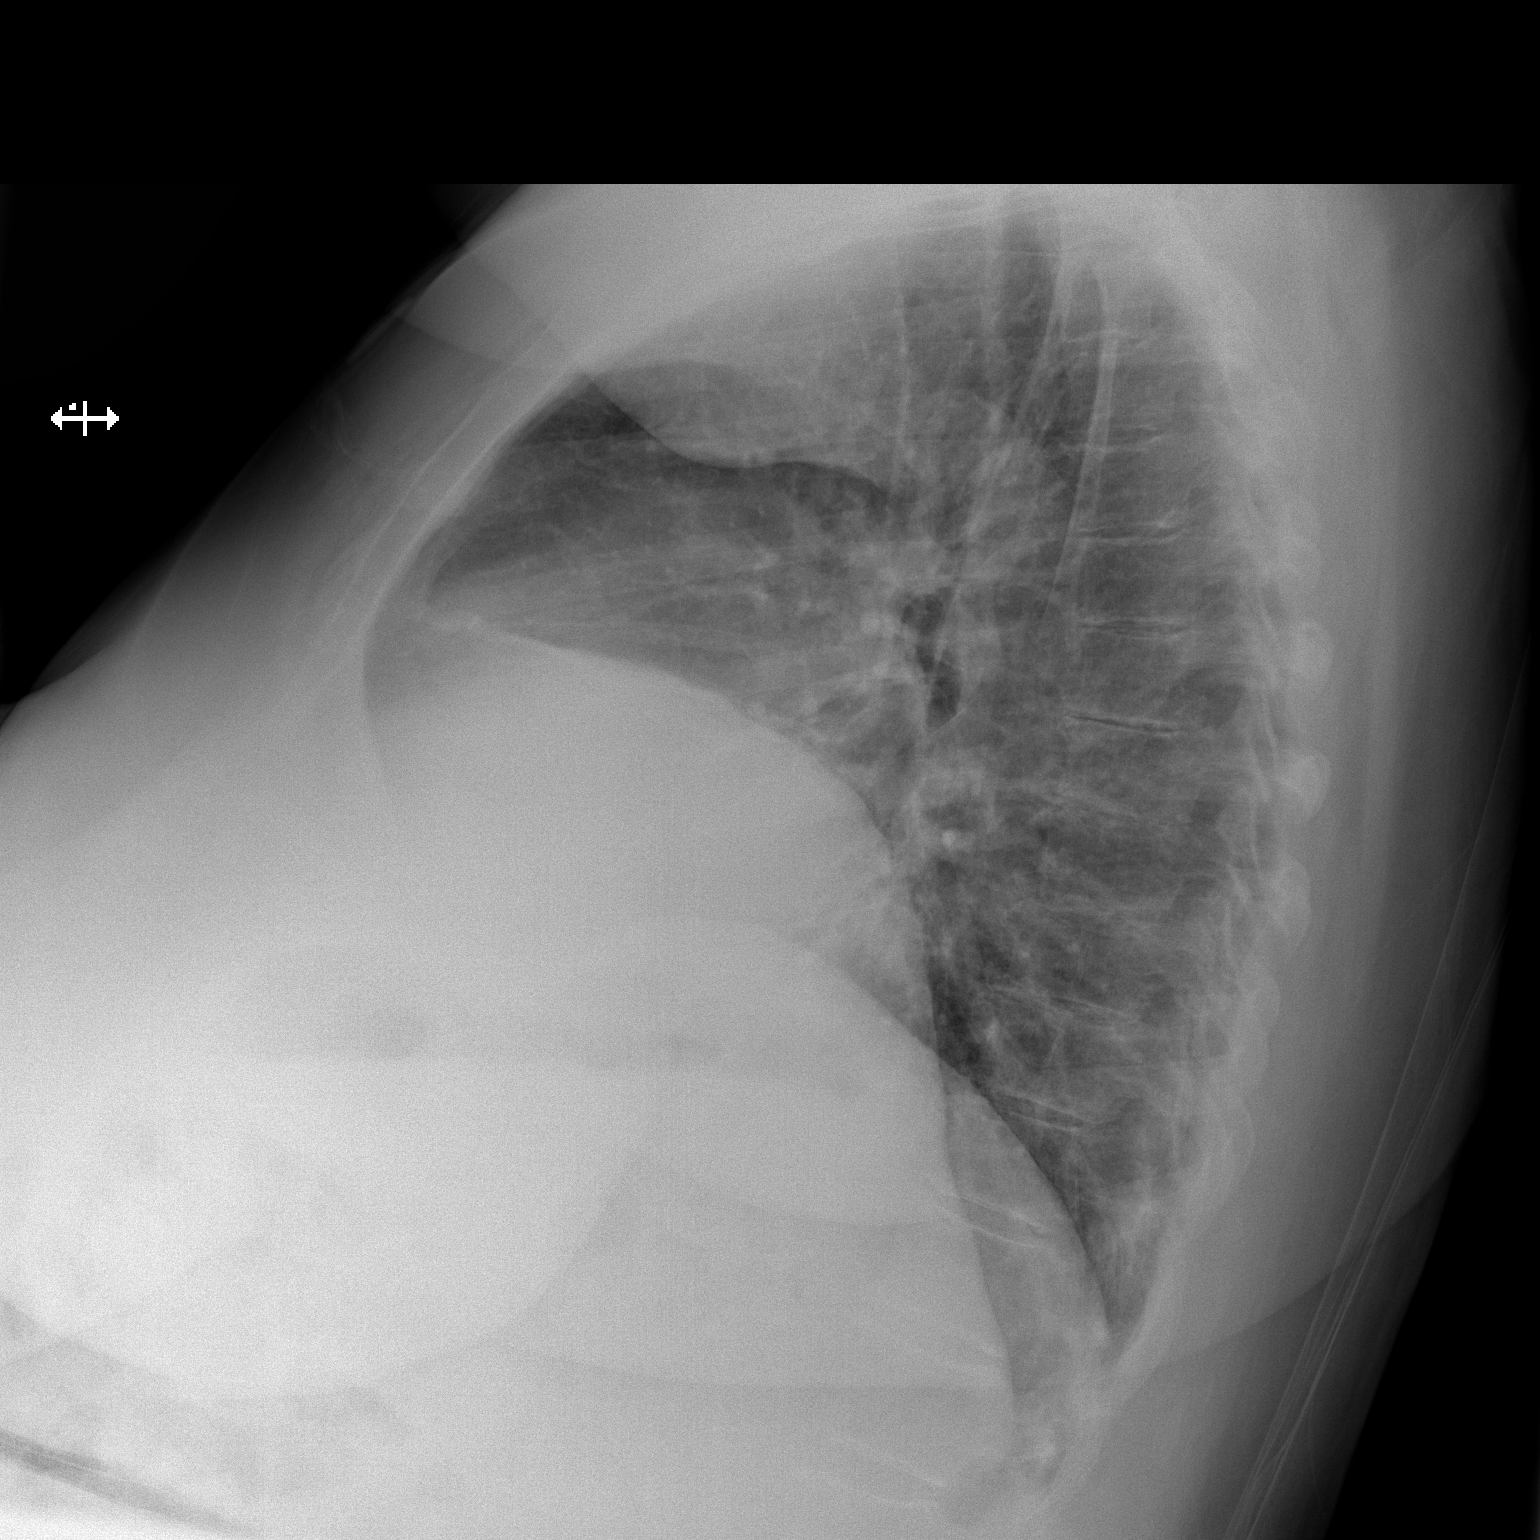

[x chest ap]
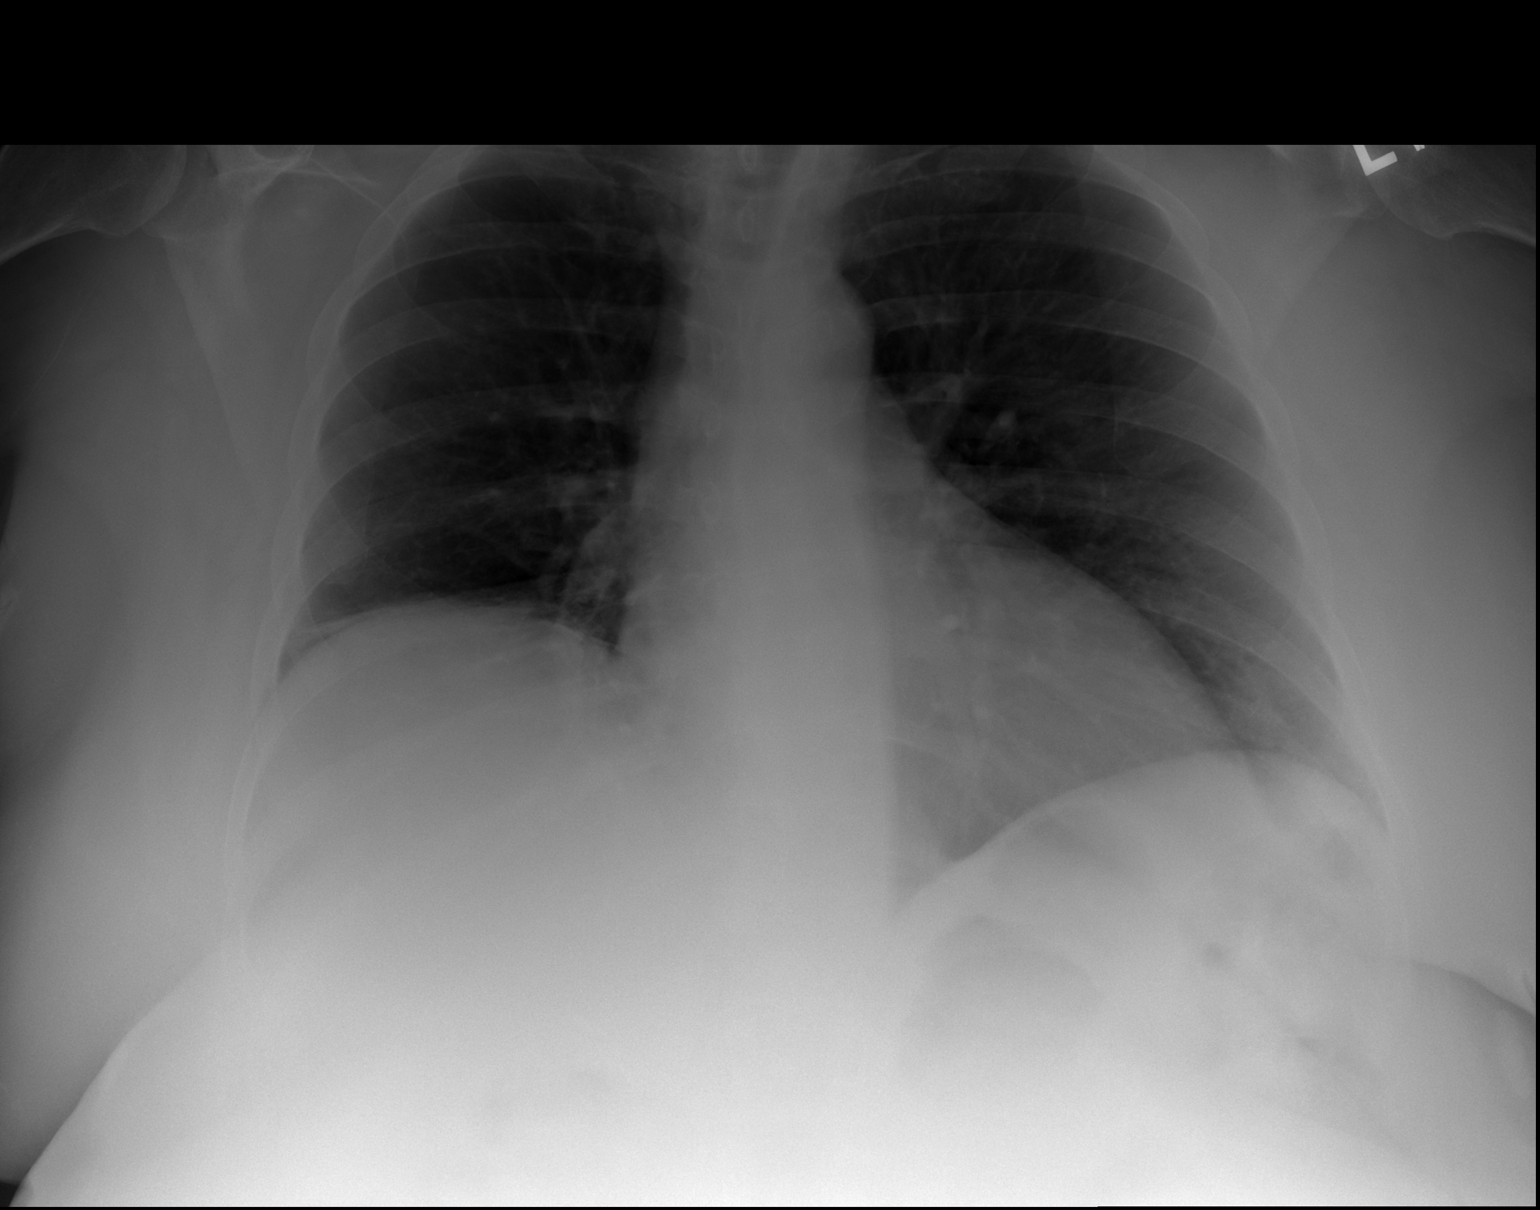

[2 of 2 positions shown; findings below may reference images not displayed]

FINDINGS: There is chronic elevation of the right hemidiaphragm, with mild
right basilar atelectasis. No pleural effusion or pneumothorax is
seen.

The cardiomediastinal silhouette remains normal in size. No acute
osseous abnormalities are identified.
IMPRESSION: Chronic elevation of the right hemidiaphragm, with mild right
basilar atelectasis.

## 2018-03-15 NOTE — Progress Notes (Signed)
Remote pacemaker transmission.   

## 2018-03-16 DIAGNOSIS — H5212 Myopia, left eye: Secondary | ICD-10-CM | POA: Diagnosis not present

## 2018-03-16 DIAGNOSIS — H52223 Regular astigmatism, bilateral: Secondary | ICD-10-CM | POA: Diagnosis not present

## 2018-03-16 DIAGNOSIS — H5201 Hypermetropia, right eye: Secondary | ICD-10-CM | POA: Diagnosis not present

## 2018-03-22 ENCOUNTER — Ambulatory Visit: Payer: Medicare HMO

## 2018-03-28 DIAGNOSIS — M65312 Trigger thumb, left thumb: Secondary | ICD-10-CM | POA: Diagnosis not present

## 2018-04-06 ENCOUNTER — Ambulatory Visit
Admission: RE | Admit: 2018-04-06 | Discharge: 2018-04-06 | Disposition: A | Discharge status: Home / Self Care | Payer: Medicare HMO | Source: Ambulatory Visit | Attending: Internal Medicine | Admitting: Internal Medicine

## 2018-04-06 DIAGNOSIS — Z1231 Encounter for screening mammogram for malignant neoplasm of breast: Secondary | ICD-10-CM

## 2018-04-08 DIAGNOSIS — M1711 Unilateral primary osteoarthritis, right knee: Secondary | ICD-10-CM | POA: Diagnosis not present

## 2018-04-08 LAB — CUP PACEART REMOTE DEVICE CHECK
Battery Remaining Percentage: 95.5 %
Brady Statistic AP VP Percent: 22 %
Brady Statistic AP VS Percent: 1 %
Brady Statistic AS VP Percent: 77 %
Brady Statistic AS VS Percent: 1 %
Brady Statistic RV Percent Paced: 99 %
Date Time Interrogation Session: 20190812061619
Implantable Lead Implant Date: 20190121
Implantable Lead Location: 753859
Lead Channel Impedance Value: 540 Ohm
Lead Channel Pacing Threshold Amplitude: 0.625 V
Lead Channel Pacing Threshold Pulse Width: 0.5 ms
Lead Channel Sensing Intrinsic Amplitude: 5.5 mV
Lead Channel Setting Pacing Amplitude: 0.875
Lead Channel Setting Pacing Amplitude: 2 V
Lead Channel Setting Pacing Pulse Width: 0.5 ms
Lead Channel Setting Sensing Sensitivity: 4 mV
MDC IDC LEAD IMPLANT DT: 20190121
MDC IDC LEAD LOCATION: 753860
MDC IDC MSMT BATTERY REMAINING LONGEVITY: 122 mo
MDC IDC MSMT BATTERY VOLTAGE: 2.99 V
MDC IDC MSMT LEADCHNL RA IMPEDANCE VALUE: 430 Ohm
MDC IDC MSMT LEADCHNL RA PACING THRESHOLD AMPLITUDE: 0.5 V
MDC IDC MSMT LEADCHNL RA PACING THRESHOLD PULSEWIDTH: 0.5 ms
MDC IDC MSMT LEADCHNL RA SENSING INTR AMPL: 1 mV
MDC IDC PG IMPLANT DT: 20170928
MDC IDC STAT BRADY RA PERCENT PACED: 21 %
Pulse Gen Model: 2272
Pulse Gen Serial Number: 7950948

## 2018-04-13 DIAGNOSIS — R69 Illness, unspecified: Secondary | ICD-10-CM | POA: Diagnosis not present

## 2018-04-15 DIAGNOSIS — M1711 Unilateral primary osteoarthritis, right knee: Secondary | ICD-10-CM | POA: Diagnosis not present

## 2018-04-21 DIAGNOSIS — M1711 Unilateral primary osteoarthritis, right knee: Secondary | ICD-10-CM | POA: Diagnosis not present

## 2018-04-30 DIAGNOSIS — M65312 Trigger thumb, left thumb: Secondary | ICD-10-CM | POA: Insufficient documentation

## 2018-05-30 DIAGNOSIS — G4733 Obstructive sleep apnea (adult) (pediatric): Secondary | ICD-10-CM | POA: Diagnosis not present

## 2018-06-13 ENCOUNTER — Ambulatory Visit (INDEPENDENT_AMBULATORY_CARE_PROVIDER_SITE_OTHER): Payer: Medicare HMO | Admitting: *Deleted

## 2018-06-13 DIAGNOSIS — I10 Essential (primary) hypertension: Secondary | ICD-10-CM

## 2018-06-13 DIAGNOSIS — I441 Atrioventricular block, second degree: Secondary | ICD-10-CM | POA: Diagnosis not present

## 2018-06-14 NOTE — Progress Notes (Signed)
Remote pacemaker transmission.   

## 2018-06-15 ENCOUNTER — Ambulatory Visit: Payer: Medicare HMO | Admitting: Nurse Practitioner

## 2018-06-27 DIAGNOSIS — E559 Vitamin D deficiency, unspecified: Secondary | ICD-10-CM | POA: Diagnosis not present

## 2018-06-27 DIAGNOSIS — I420 Dilated cardiomyopathy: Secondary | ICD-10-CM | POA: Diagnosis not present

## 2018-06-27 DIAGNOSIS — Z0001 Encounter for general adult medical examination with abnormal findings: Secondary | ICD-10-CM | POA: Diagnosis not present

## 2018-06-27 DIAGNOSIS — G4733 Obstructive sleep apnea (adult) (pediatric): Secondary | ICD-10-CM | POA: Diagnosis not present

## 2018-06-27 DIAGNOSIS — I10 Essential (primary) hypertension: Secondary | ICD-10-CM | POA: Diagnosis not present

## 2018-06-27 DIAGNOSIS — Z950 Presence of cardiac pacemaker: Secondary | ICD-10-CM | POA: Diagnosis not present

## 2018-06-27 DIAGNOSIS — Z79899 Other long term (current) drug therapy: Secondary | ICD-10-CM | POA: Diagnosis not present

## 2018-06-27 DIAGNOSIS — E039 Hypothyroidism, unspecified: Secondary | ICD-10-CM | POA: Diagnosis not present

## 2018-06-27 DIAGNOSIS — Z7189 Other specified counseling: Secondary | ICD-10-CM | POA: Diagnosis not present

## 2018-07-07 DIAGNOSIS — M1711 Unilateral primary osteoarthritis, right knee: Secondary | ICD-10-CM | POA: Diagnosis not present

## 2018-08-14 LAB — CUP PACEART REMOTE DEVICE CHECK
Battery Voltage: 3.01 V
Brady Statistic AP VP Percent: 26 %
Brady Statistic AS VS Percent: 1 %
Brady Statistic RV Percent Paced: 99 %
Date Time Interrogation Session: 20191111085848
Implantable Lead Implant Date: 20190121
Implantable Lead Implant Date: 20190121
Implantable Lead Location: 753860
Lead Channel Impedance Value: 490 Ohm
Lead Channel Pacing Threshold Amplitude: 0.625 V
Lead Channel Pacing Threshold Amplitude: 0.625 V
Lead Channel Pacing Threshold Pulse Width: 0.5 ms
Lead Channel Sensing Intrinsic Amplitude: 1.4 mV
Lead Channel Setting Pacing Amplitude: 2 V
Lead Channel Setting Sensing Sensitivity: 4 mV
MDC IDC LEAD LOCATION: 753859
MDC IDC MSMT BATTERY REMAINING LONGEVITY: 121 mo
MDC IDC MSMT BATTERY REMAINING PERCENTAGE: 95.5 %
MDC IDC MSMT LEADCHNL RA IMPEDANCE VALUE: 430 Ohm
MDC IDC MSMT LEADCHNL RA PACING THRESHOLD PULSEWIDTH: 0.5 ms
MDC IDC MSMT LEADCHNL RV SENSING INTR AMPL: 12 mV
MDC IDC PG IMPLANT DT: 20170928
MDC IDC SET LEADCHNL RV PACING AMPLITUDE: 0.875
MDC IDC SET LEADCHNL RV PACING PULSEWIDTH: 0.5 ms
MDC IDC STAT BRADY AP VS PERCENT: 1 %
MDC IDC STAT BRADY AS VP PERCENT: 73 %
MDC IDC STAT BRADY RA PERCENT PACED: 24 %
Pulse Gen Model: 2272
Pulse Gen Serial Number: 7950948

## 2018-08-16 NOTE — Progress Notes (Signed)
Electrophysiology Office Note Date: 08/17/2018  ID:  Kelly Castaneda, DOB Jan 10, 1951, MRN [id number]  PCP: Marden NobleGates, Robert, MD Primary Cardiologist: Eldridge DaceVaranasi Electrophysiologist: Allred  CC: Pacemaker follow-up  Kelly Castaneda is a 68 y.o. female seen today for Dr Johney FrameAllred.  He presents today for routine electrophysiology followup.  Since last being seen in our clinic, the patient reports doing reasonably well.  She lost her mother in the fall.   She denies chest pain, palpitations, dyspnea, PND, orthopnea, nausea, vomiting, dizziness, syncope, edema, weight gain, or early satiety.  Device History: STJ dual chamber PPM implanted 2017 for intermittent complete heart block; lead revisions 2018; 2019   Past Medical History:  Diagnosis Date  . Arthritis    "wrists, right knee" (11/06/2016)  . Bell's palsy   . Chronic diastolic CHF (congestive heart failure) (HCC)    a. 11/2015 Echo: EF 50-55%, Gr1 DD, mildly dil LA.  Marland Kitchen. Complete heart block (HCC)    mobitz II second degree AV  . Essential hypertension   . Family history of adverse reaction to anesthesia    "had a little trouble waking my brother up; dx'd sleep apnea"  . GERD (gastroesophageal reflux disease)   . H/O hiatal hernia   . History of kidney stones   . Hypothyroidism    "not on RX" (11/06/2016)  . Migraine    "none in years" (11/06/2016)  . OSA on CPAP    "I wear nose pillows"  . Presence of permanent cardiac pacemaker   . Shingles   . Syncope    a. 11/2015 - subsequent event monitoring revealed CHF in setting of beta blocker therapy - resolved with discontinuation of coreg.   Past Surgical History:  Procedure Laterality Date  . BACK SURGERY    . BREAST BIOPSY Right 1990s   biopsy with titanium clip placement  . CARDIAC CATHETERIZATION     "years ago"  . CARDIAC CATHETERIZATION N/A 01/03/2016   Procedure: Temporary Pacemaker;  Surgeon: Lennette Biharihomas A Kelly, MD;  [facility];  Service: Cardiovascular;   Laterality: N/A;  . DILATION AND CURETTAGE OF UTERUS    . EP IMPLANTABLE DEVICE N/A 04/30/2016   SJM Assurity MRI PPM implanted for transient complete heart block by Dr Johney FrameAllred  . INSERT / REPLACE / REMOVE PACEMAKER    . JOINT REPLACEMENT    . LAPAROSCOPIC CHOLECYSTECTOMY  ~ 1990  . LEAD REVISION  11/06/2016   pacemaker  . LEAD REVISION/REPAIR N/A 11/06/2016   Procedure: Lead Revision/Repair;  Surgeon: Hillis RangeJames Allred, MD;  [facility]: [facility];  Service: Cardiovascular;  Laterality: N/A;  . LEAD REVISION/REPAIR N/A 08/23/2017   Procedure: LEAD REVISION/REPAIR;  Surgeon: Marinus Mawaylor, Gregg W, MD;  [facility];  Service: Cardiovascular;  Laterality: N/A;  . LITHOTRIPSY  1990s   "I've had a couple of those" (11/06/2016)  . LUMBAR DISC SURGERY  ~ 2007  . PARATHYROIDECTOMY Right 1990s  . TOTAL KNEE ARTHROPLASTY Left 09/25/2013   Procedure: LEFT TOTAL KNEE ARTHROPLASTY;  Surgeon: Loanne DrillingFrank V Aluisio, MD;  Location: [facility];  Service: Orthopedics;  Laterality: Left;  . TUBAL LIGATION    . VAGINAL HYSTERECTOMY  1996    Current Outpatient Medications  Medication Sig Dispense Refill  . amLODipine (NORVASC) 5 MG tablet Take 1 tablet (5 mg total) by mouth daily. 30 tablet 6  . aspirin EC 81 MG tablet Take 81 mg by mouth daily.    . carvedilol (COREG) 3.125 MG tablet Take  1 tablet (3.125 mg total) by mouth 2 (two) times daily with a meal. 180 tablet 3  . Cholecalciferol (VITAMIN D) 2000 UNITS tablet Take 2,000 Units by mouth daily.    . Cyanocobalamin (VITAMIN B 12 PO) Take 100 mcg by mouth daily.    . furosemide (LASIX) 20 MG tablet Take 1 tablet (20 mg total) by mouth daily. 30 tablet 6  . losartan (COZAAR) 100 MG tablet Take 100 mg by mouth every morning.    . naproxen sodium (ANAPROX) 220 MG tablet Take 440 mg by mouth daily as needed (arthritis).    Marland Kitchen omeprazole (PRILOSEC) 20 MG capsule Take 20 mg by mouth daily. For heartburn     No current facility-administered medications for this  visit.     Allergies:   Codeine; Fentanyl; Morphine; and Morphine and related   Social History: Social History   Socioeconomic History  . Marital status: Divorced    Spouse name: Not on file  . Number of children: Not on file  . Years of education: Not on file  . Highest education level: Not on file  Occupational History  . Not on file  Social Needs  . Financial resource strain: Not on file  . Food insecurity:    Worry: Not on file    Inability: Not on file  . Transportation needs:    Medical: Not on file    Non-medical: Not on file  Tobacco Use  . Smoking status: Never Smoker  . Smokeless tobacco: Never Used  Substance and Sexual Activity  . Alcohol use: No  . Drug use: No  . Sexual activity: Never  Lifestyle  . Physical activity:    Days per week: Not on file    Minutes per session: Not on file  . Stress: Not on file  Relationships  . Social connections:    Talks on phone: Not on file    Gets together: Not on file    Attends religious service: Not on file    Active member of club or organization: Not on file    Attends meetings of clubs or organizations: Not on file    Relationship status: Not on file  . Intimate partner violence:    Fear of current or ex partner: Not on file    Emotionally abused: Not on file    Physically abused: Not on file    Forced sexual activity: Not on file  Other Topics Concern  . Not on file  Social History Narrative  . Not on file    Family History: Family History  Problem Relation Age of Onset  . Diabetes Mother   . Hypertension Mother   . CAD Father   . Heart attack Father   . Hypertension Father   . Stroke Neg Hx   . Breast cancer Neg Hx      Review of Systems: All other systems reviewed and are otherwise negative except as noted above.   Physical Exam: VS:  BP 128/76   Pulse 100   Ht 5\' 4"  (1.626 m)   Wt 241 lb (109.3 kg)   SpO2 98%   BMI 41.37 kg/m  , BMI Body mass index is 41.37 kg/m.  GEN- The  patient is obese appearing, alert and oriented x 3 today.   HEENT: normocephalic, atraumatic; sclera clear, conjunctiva pink; hearing intact; oropharynx clear; neck supple  Lungs- Clear to ausculation bilaterally, normal work of breathing.  No wheezes, rales, rhonchi Heart- Regular rate and rhythm  GI-  soft, non-tender, non-distended, bowel sounds present  Extremities- no clubbing, cyanosis, or edema  MS- no significant deformity or atrophy Skin- warm and dry, no rash or lesion; PPM pocket well healed Psych- euthymic mood, full affect Neuro- strength and sensation are intact  PPM Interrogation- reviewed in detail today,  See PACEART report  EKG:  EKG is not ordered today.  Recent Labs: 08/20/2017: BUN 17; Creatinine, Ser 0.88; Hemoglobin 14.0; Platelets 221; Potassium 4.4; Sodium 143   Wt Readings from Last 3 Encounters:  08/17/18 241 lb (109.3 kg)  12/13/17 241 lb (109.3 kg)  08/24/17 242 lb 8 oz (110 kg)     Other studies Reviewed: Additional studies/ records that were reviewed today include: Dr Jenel Lucks office notes   Assessment and Plan:  1.  Complete heart block Normal PPM function See Pace Art report No changes today  2.  HTN Stable No change required today  3.  Chronic diastolic heart failure Stable No change required today    Current medicines are reviewed at length with the patient today.   The patient does not have concerns regarding her medicines.  The following changes were made today:  none  Labs/ tests ordered today include: none No orders of the defined types were placed in this encounter.    Disposition:   Follow up with Merlin, me in 6 months      Signed, Gypsy Balsam, NP 08/17/2018 10:10 AM  [facility] [address] [place] [address] [phone] (office) [phone] (fax)

## 2018-08-17 ENCOUNTER — Encounter

## 2018-08-17 ENCOUNTER — Ambulatory Visit: Payer: Medicare HMO | Admitting: Nurse Practitioner

## 2018-08-17 ENCOUNTER — Encounter: Payer: Self-pay | Admitting: Nurse Practitioner

## 2018-08-17 VITALS — BP 128/76 | HR 100 | Ht 64.0 in | Wt 241.0 lb

## 2018-08-17 DIAGNOSIS — I5032 Chronic diastolic (congestive) heart failure: Secondary | ICD-10-CM

## 2018-08-17 DIAGNOSIS — I442 Atrioventricular block, complete: Secondary | ICD-10-CM

## 2018-08-17 DIAGNOSIS — I10 Essential (primary) hypertension: Secondary | ICD-10-CM

## 2018-08-17 LAB — CUP PACEART INCLINIC DEVICE CHECK
Date Time Interrogation Session: 20200115101156
Implantable Lead Implant Date: 20190121
Implantable Lead Implant Date: 20190121
Implantable Lead Location: 753859
Implantable Lead Location: 753860
Implantable Pulse Generator Implant Date: 20170928
Pulse Gen Model: 2272
Pulse Gen Serial Number: 7950948

## 2018-08-17 IMAGING — DX DG CHEST 2V
2 series · 2 of 2 positions shown · non-contrast
Comparison: Portable chest x-ray January 03, 2016 and PA and lateral
chest x-ray November 28, 2015.

CLINICAL DATA: Pacemaker placement yesterday. History of chronic
CHF

EXAM:
CHEST  2 VIEW

[chest pa]
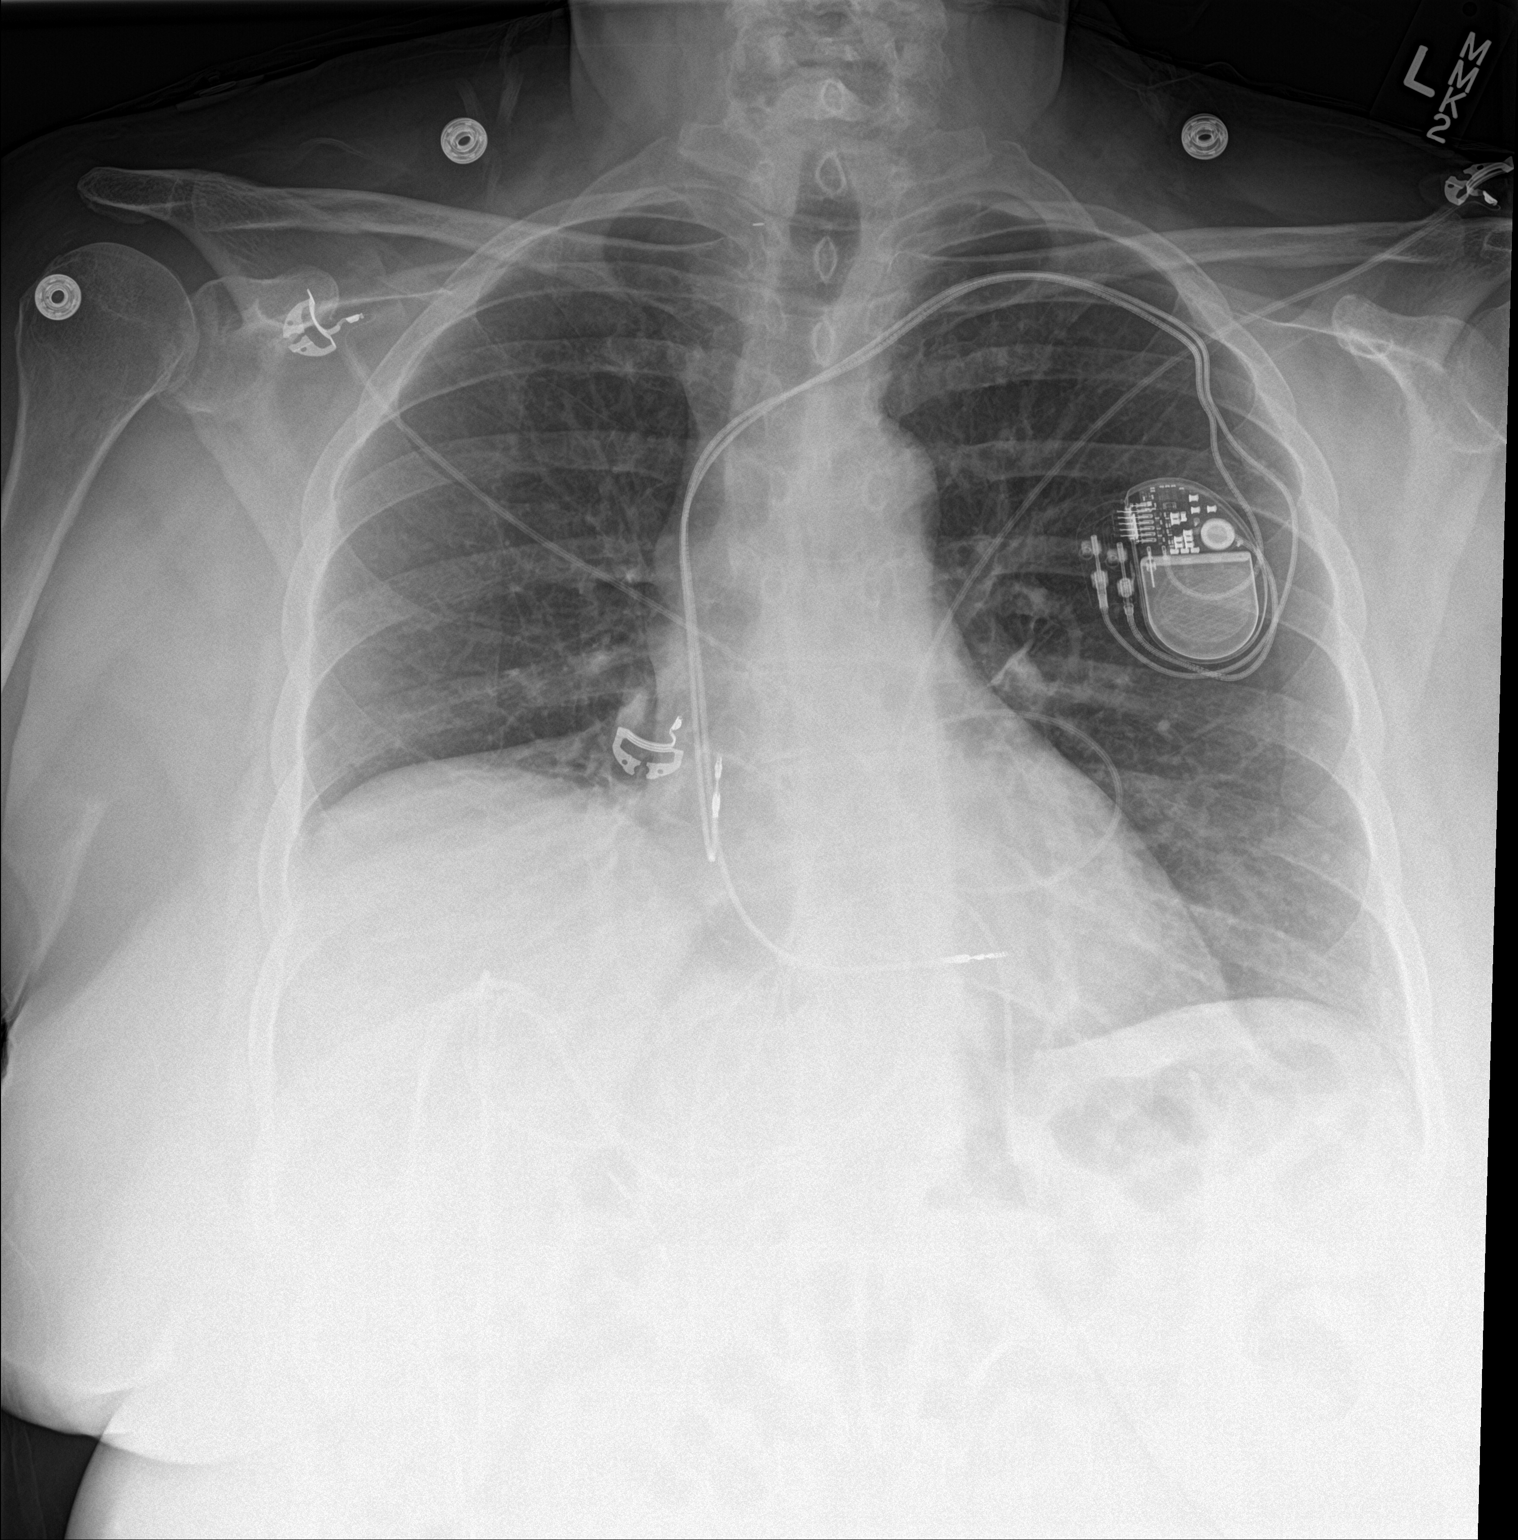

[chest lat]
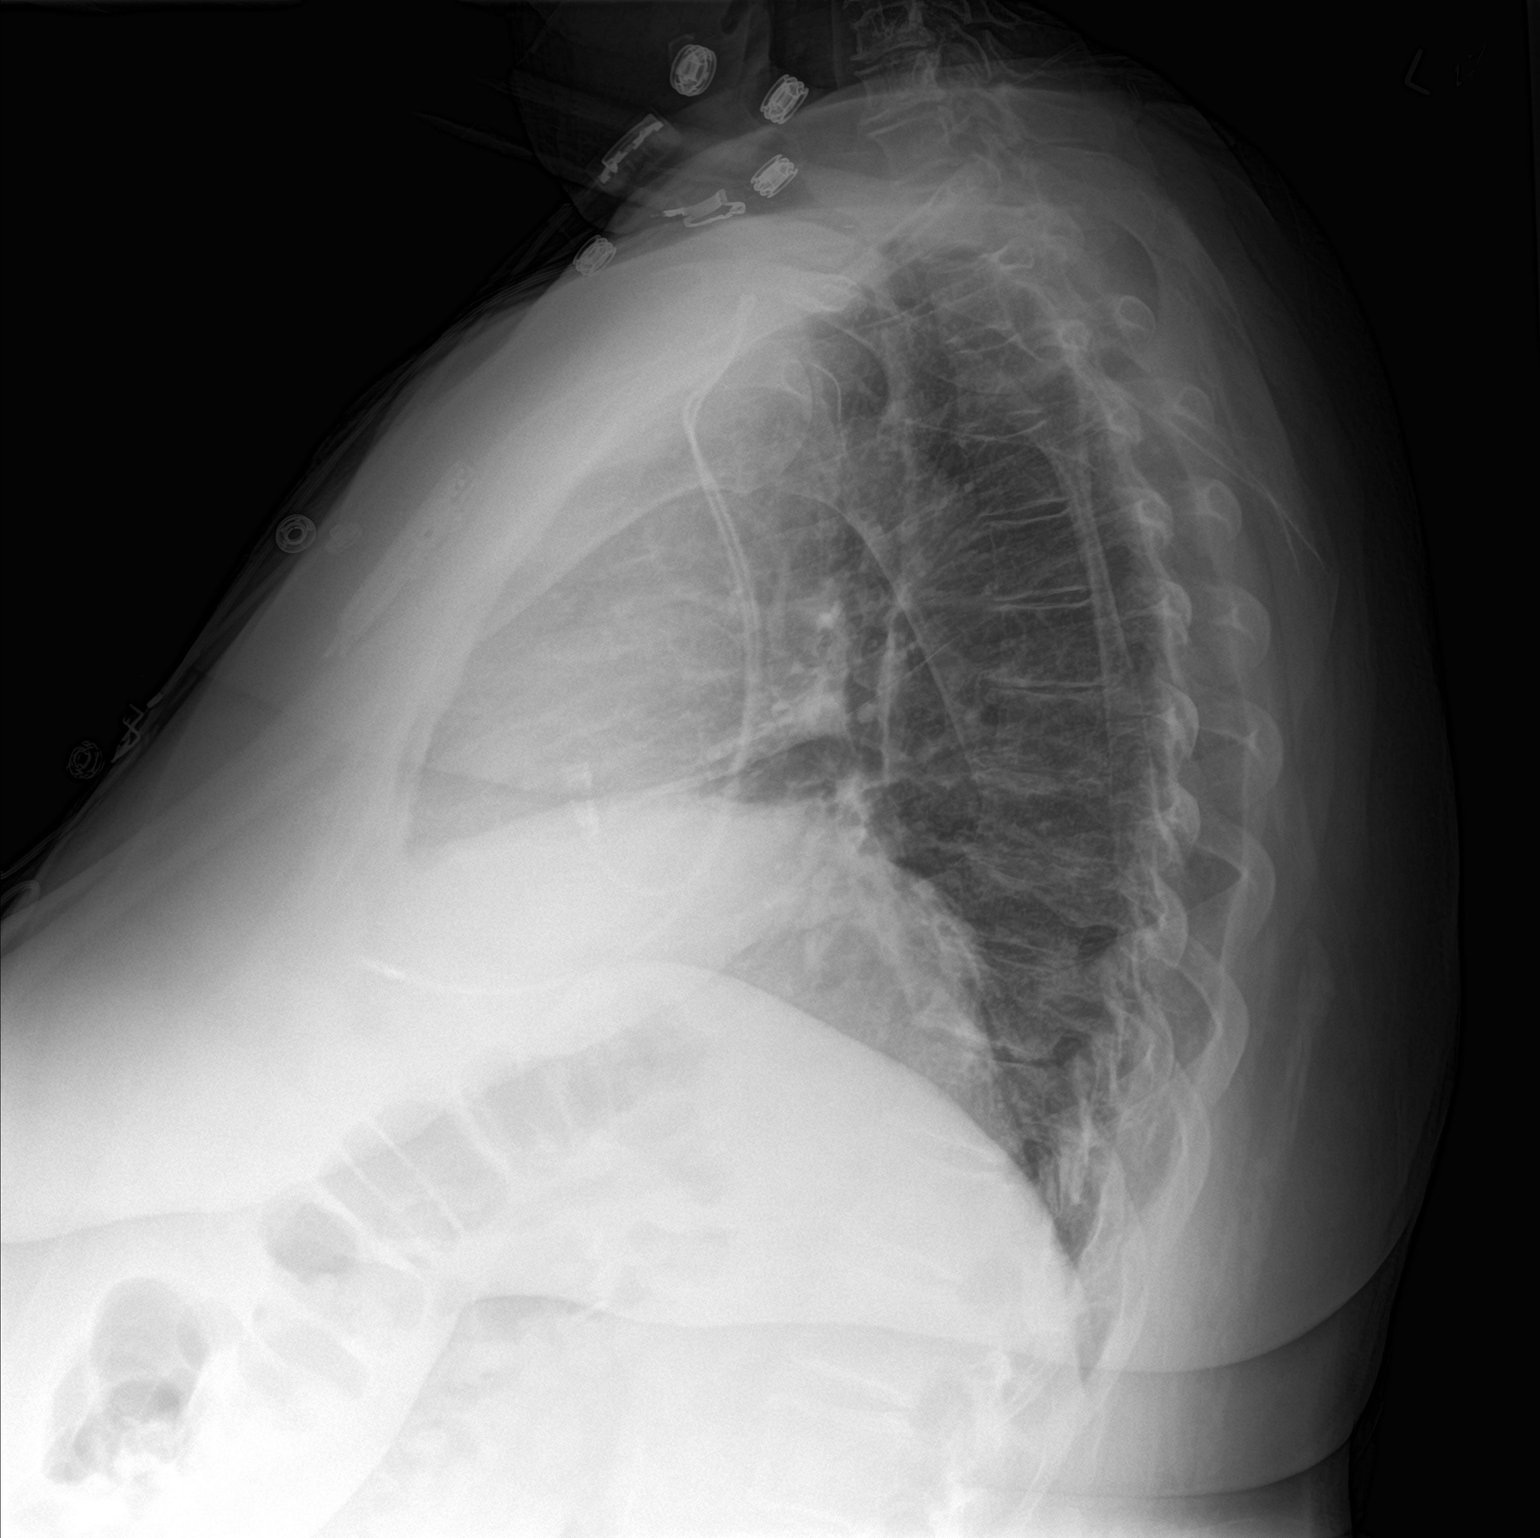

[2 of 2 positions shown; findings below may reference images not displayed]

FINDINGS: There is chronic elevation of the right hemidiaphragm. The aerated
portions of both lungs are clear. There is no pneumothorax or
pleural effusion. The heart and pulmonary vascularity are normal.
The permanent pacemaker electrodes are in reasonable position. The
external pacemaker defibrillator pads are no longer present. There
is gentle dextrocurvature centered at approximately T5 which is
stable. There is multilevel degenerative disc space narrowing of the
thoracic spine.
IMPRESSION: No postprocedure complication following permanent pacemaker
placement.

## 2018-08-17 NOTE — Patient Instructions (Signed)
Medication Instructions:  NONE If you need a refill on your cardiac medications before your next appointment, please call your pharmacy.   Lab work: NONE If you have labs (blood work) drawn today and your tests are completely normal, you will receive your results only by: Marland Kitchen MyChart Message (if you have MyChart) OR . A paper copy in the mail If you have any lab test that is abnormal or we need to change your treatment, we will call you to review the results.  Testing/Procedures: NONE  Follow-Up: 6 MONTHS WITH AMBER SEILER  At [facility], you and your health needs are our priority.  As part of our continuing mission to provide you with exceptional heart care, we have created designated Provider Care Teams.  These Care Teams include your primary Cardiologist (physician) and Advanced Practice Providers (APPs -  Physician Assistants and Nurse Practitioners) who all work together to provide you with the care you need, when you need it. .   Any Other Special Instructions Will Be Listed Below (If Applicable). Remote monitoring is used to monitor your Pacemaker  from home. This monitoring reduces the number of office visits required to check your device to one time per year. It allows [place] to keep an eye on the functioning of your device to ensure it is working properly. You are scheduled for a device check from home on 11/16/2018. You may send your transmission at any time that day. If you have a wireless device, the transmission will be sent automatically. After your physician reviews your transmission, you will receive a postcard with your next transmission date.

## 2018-09-19 DIAGNOSIS — G4733 Obstructive sleep apnea (adult) (pediatric): Secondary | ICD-10-CM | POA: Diagnosis not present

## 2018-10-06 DIAGNOSIS — M25561 Pain in right knee: Secondary | ICD-10-CM | POA: Diagnosis not present

## 2018-10-06 DIAGNOSIS — M1711 Unilateral primary osteoarthritis, right knee: Secondary | ICD-10-CM | POA: Diagnosis not present

## 2018-10-11 DIAGNOSIS — R0609 Other forms of dyspnea: Secondary | ICD-10-CM | POA: Diagnosis not present

## 2018-10-11 DIAGNOSIS — Z79899 Other long term (current) drug therapy: Secondary | ICD-10-CM | POA: Diagnosis not present

## 2018-10-11 DIAGNOSIS — R5383 Other fatigue: Secondary | ICD-10-CM | POA: Diagnosis not present

## 2018-11-16 ENCOUNTER — Other Ambulatory Visit: Payer: Self-pay

## 2018-11-16 ENCOUNTER — Ambulatory Visit (INDEPENDENT_AMBULATORY_CARE_PROVIDER_SITE_OTHER): Payer: Medicare HMO | Admitting: *Deleted

## 2018-11-16 DIAGNOSIS — I442 Atrioventricular block, complete: Secondary | ICD-10-CM

## 2018-11-16 DIAGNOSIS — R001 Bradycardia, unspecified: Secondary | ICD-10-CM

## 2018-11-16 LAB — CUP PACEART REMOTE DEVICE CHECK
Battery Remaining Longevity: 118 mo
Battery Remaining Percentage: 95.5 %
Battery Voltage: 2.99 V
Brady Statistic AP VP Percent: 36 %
Brady Statistic AP VS Percent: 1 %
Brady Statistic AS VP Percent: 63 %
Brady Statistic AS VS Percent: 1 %
Brady Statistic RA Percent Paced: 36 %
Brady Statistic RV Percent Paced: 99 %
Date Time Interrogation Session: 20200415061200
Implantable Lead Implant Date: 20190121
Implantable Lead Implant Date: 20190121
Implantable Lead Location: 753859
Implantable Lead Location: 753860
Implantable Pulse Generator Implant Date: 20170928
Lead Channel Impedance Value: 430 Ohm
Lead Channel Impedance Value: 450 Ohm
Lead Channel Pacing Threshold Amplitude: 0.625 V
Lead Channel Pacing Threshold Amplitude: 0.875 V
Lead Channel Pacing Threshold Pulse Width: 0.5 ms
Lead Channel Pacing Threshold Pulse Width: 0.5 ms
Lead Channel Sensing Intrinsic Amplitude: 1 mV
Lead Channel Sensing Intrinsic Amplitude: 12 mV
Lead Channel Setting Pacing Amplitude: 1.125
Lead Channel Setting Pacing Amplitude: 2 V
Lead Channel Setting Pacing Pulse Width: 0.5 ms
Lead Channel Setting Sensing Sensitivity: 4 mV
Pulse Gen Model: 2272
Pulse Gen Serial Number: 7950948

## 2018-11-23 NOTE — Progress Notes (Signed)
Remote pacemaker transmission.   

## 2019-02-15 ENCOUNTER — Ambulatory Visit (INDEPENDENT_AMBULATORY_CARE_PROVIDER_SITE_OTHER): Payer: Medicare HMO | Admitting: *Deleted

## 2019-02-15 DIAGNOSIS — I442 Atrioventricular block, complete: Secondary | ICD-10-CM | POA: Diagnosis not present

## 2019-02-15 LAB — CUP PACEART REMOTE DEVICE CHECK
Date Time Interrogation Session: 20200715144815
Implantable Lead Implant Date: 20190121
Implantable Lead Implant Date: 20190121
Implantable Lead Location: 753859
Implantable Lead Location: 753860
Implantable Pulse Generator Implant Date: 20170928
Pulse Gen Model: 2272
Pulse Gen Serial Number: 7950948

## 2019-02-25 ENCOUNTER — Encounter: Payer: Self-pay | Admitting: Cardiology

## 2019-02-25 NOTE — Progress Notes (Signed)
Remote pacemaker transmission.   

## 2019-02-27 ENCOUNTER — Other Ambulatory Visit: Payer: Self-pay | Admitting: Internal Medicine

## 2019-02-27 ENCOUNTER — Other Ambulatory Visit: Payer: Self-pay | Admitting: Pediatric Surgery

## 2019-02-27 DIAGNOSIS — Z1231 Encounter for screening mammogram for malignant neoplasm of breast: Secondary | ICD-10-CM

## 2019-02-27 DIAGNOSIS — G4733 Obstructive sleep apnea (adult) (pediatric): Secondary | ICD-10-CM | POA: Diagnosis not present

## 2019-03-23 DIAGNOSIS — H5213 Myopia, bilateral: Secondary | ICD-10-CM | POA: Diagnosis not present

## 2019-03-30 DIAGNOSIS — G4733 Obstructive sleep apnea (adult) (pediatric): Secondary | ICD-10-CM | POA: Diagnosis not present

## 2019-04-14 DIAGNOSIS — R69 Illness, unspecified: Secondary | ICD-10-CM | POA: Diagnosis not present

## 2019-04-18 ENCOUNTER — Ambulatory Visit
Admission: RE | Admit: 2019-04-18 | Discharge: 2019-04-18 | Disposition: A | Discharge status: Home / Self Care | Payer: Medicare HMO | Source: Ambulatory Visit | Attending: Internal Medicine | Admitting: Internal Medicine

## 2019-04-18 ENCOUNTER — Other Ambulatory Visit: Payer: Self-pay

## 2019-04-18 DIAGNOSIS — Z1231 Encounter for screening mammogram for malignant neoplasm of breast: Secondary | ICD-10-CM

## 2019-04-28 ENCOUNTER — Telehealth: Payer: Self-pay

## 2019-04-30 DIAGNOSIS — G4733 Obstructive sleep apnea (adult) (pediatric): Secondary | ICD-10-CM | POA: Diagnosis not present

## 2019-05-01 ENCOUNTER — Telehealth: Payer: Medicare HMO | Admitting: Internal Medicine

## 2019-05-17 ENCOUNTER — Ambulatory Visit (INDEPENDENT_AMBULATORY_CARE_PROVIDER_SITE_OTHER): Payer: Medicare HMO | Admitting: *Deleted

## 2019-05-17 DIAGNOSIS — I441 Atrioventricular block, second degree: Secondary | ICD-10-CM

## 2019-05-17 DIAGNOSIS — I509 Heart failure, unspecified: Secondary | ICD-10-CM

## 2019-05-18 LAB — CUP PACEART REMOTE DEVICE CHECK
Battery Remaining Longevity: 118 mo
Battery Remaining Percentage: 95.5 %
Battery Voltage: 2.99 V
Brady Statistic AP VP Percent: 40 %
Brady Statistic AP VS Percent: 1 %
Brady Statistic AS VP Percent: 60 %
Brady Statistic AS VS Percent: 1 %
Brady Statistic RA Percent Paced: 39 %
Brady Statistic RV Percent Paced: 99 %
Date Time Interrogation Session: 20201014072138
Implantable Lead Implant Date: 20190121
Implantable Lead Implant Date: 20190121
Implantable Lead Location: 753859
Implantable Lead Location: 753860
Implantable Pulse Generator Implant Date: 20170928
Lead Channel Impedance Value: 430 Ohm
Lead Channel Impedance Value: 450 Ohm
Lead Channel Pacing Threshold Amplitude: 0.75 V
Lead Channel Pacing Threshold Amplitude: 0.875 V
Lead Channel Pacing Threshold Pulse Width: 0.5 ms
Lead Channel Pacing Threshold Pulse Width: 0.5 ms
Lead Channel Sensing Intrinsic Amplitude: 1 mV
Lead Channel Sensing Intrinsic Amplitude: 12 mV
Lead Channel Setting Pacing Amplitude: 1.125
Lead Channel Setting Pacing Amplitude: 2 V
Lead Channel Setting Pacing Pulse Width: 0.5 ms
Lead Channel Setting Sensing Sensitivity: 4 mV
Pulse Gen Model: 2272
Pulse Gen Serial Number: 7950948

## 2019-05-20 IMAGING — CR DG CHEST 2V
2 series · 2 of 2 positions shown · non-contrast
Comparison: 11/06/2016 and 05/01/2016.

CLINICAL DATA: 65-year-old female with pacemaker in place. History
of malfunction. No chest complaints. Nonsmoker. Hypertension.
Initial encounter.

EXAM:
CHEST  2 VIEW

[w chest pa]
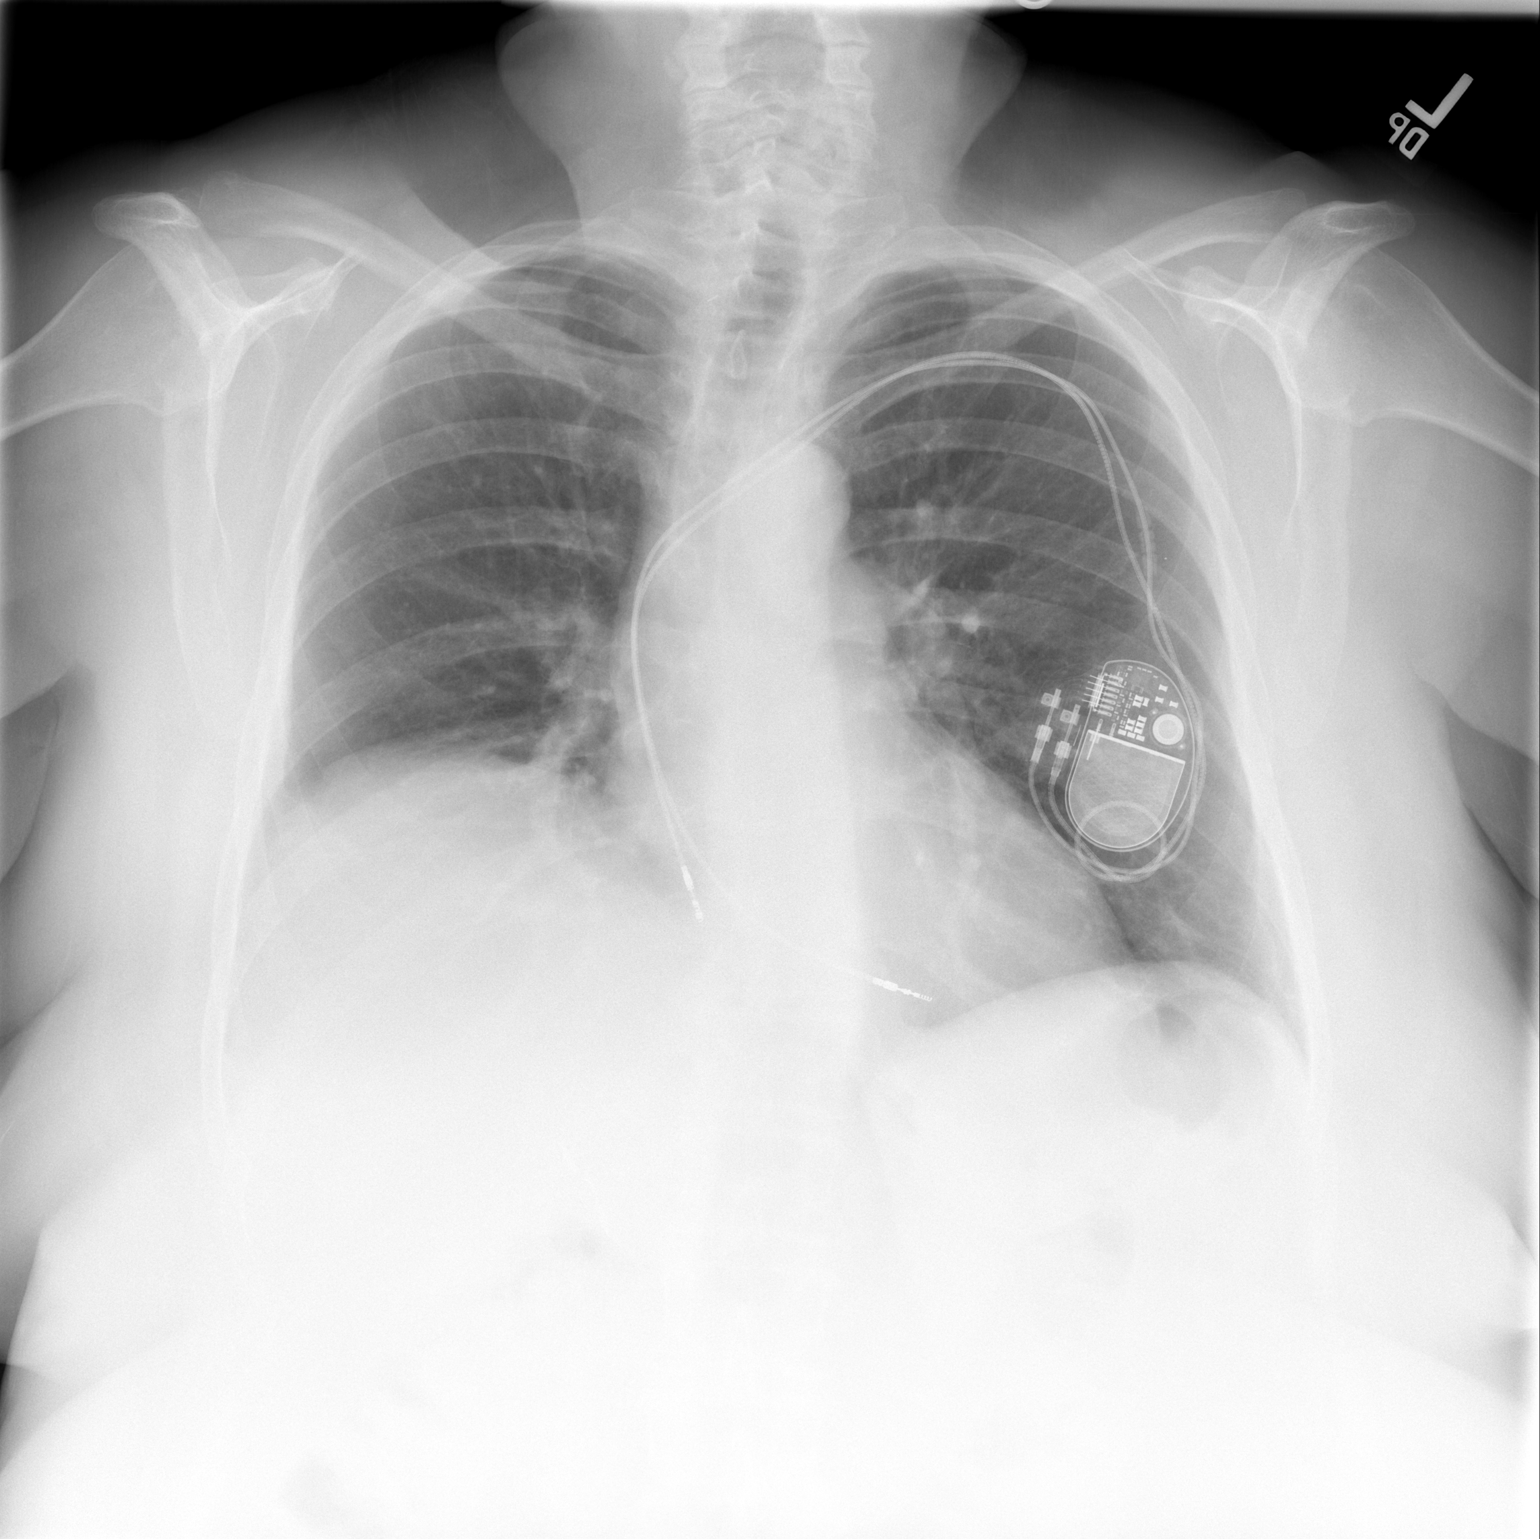

[w chest lat]
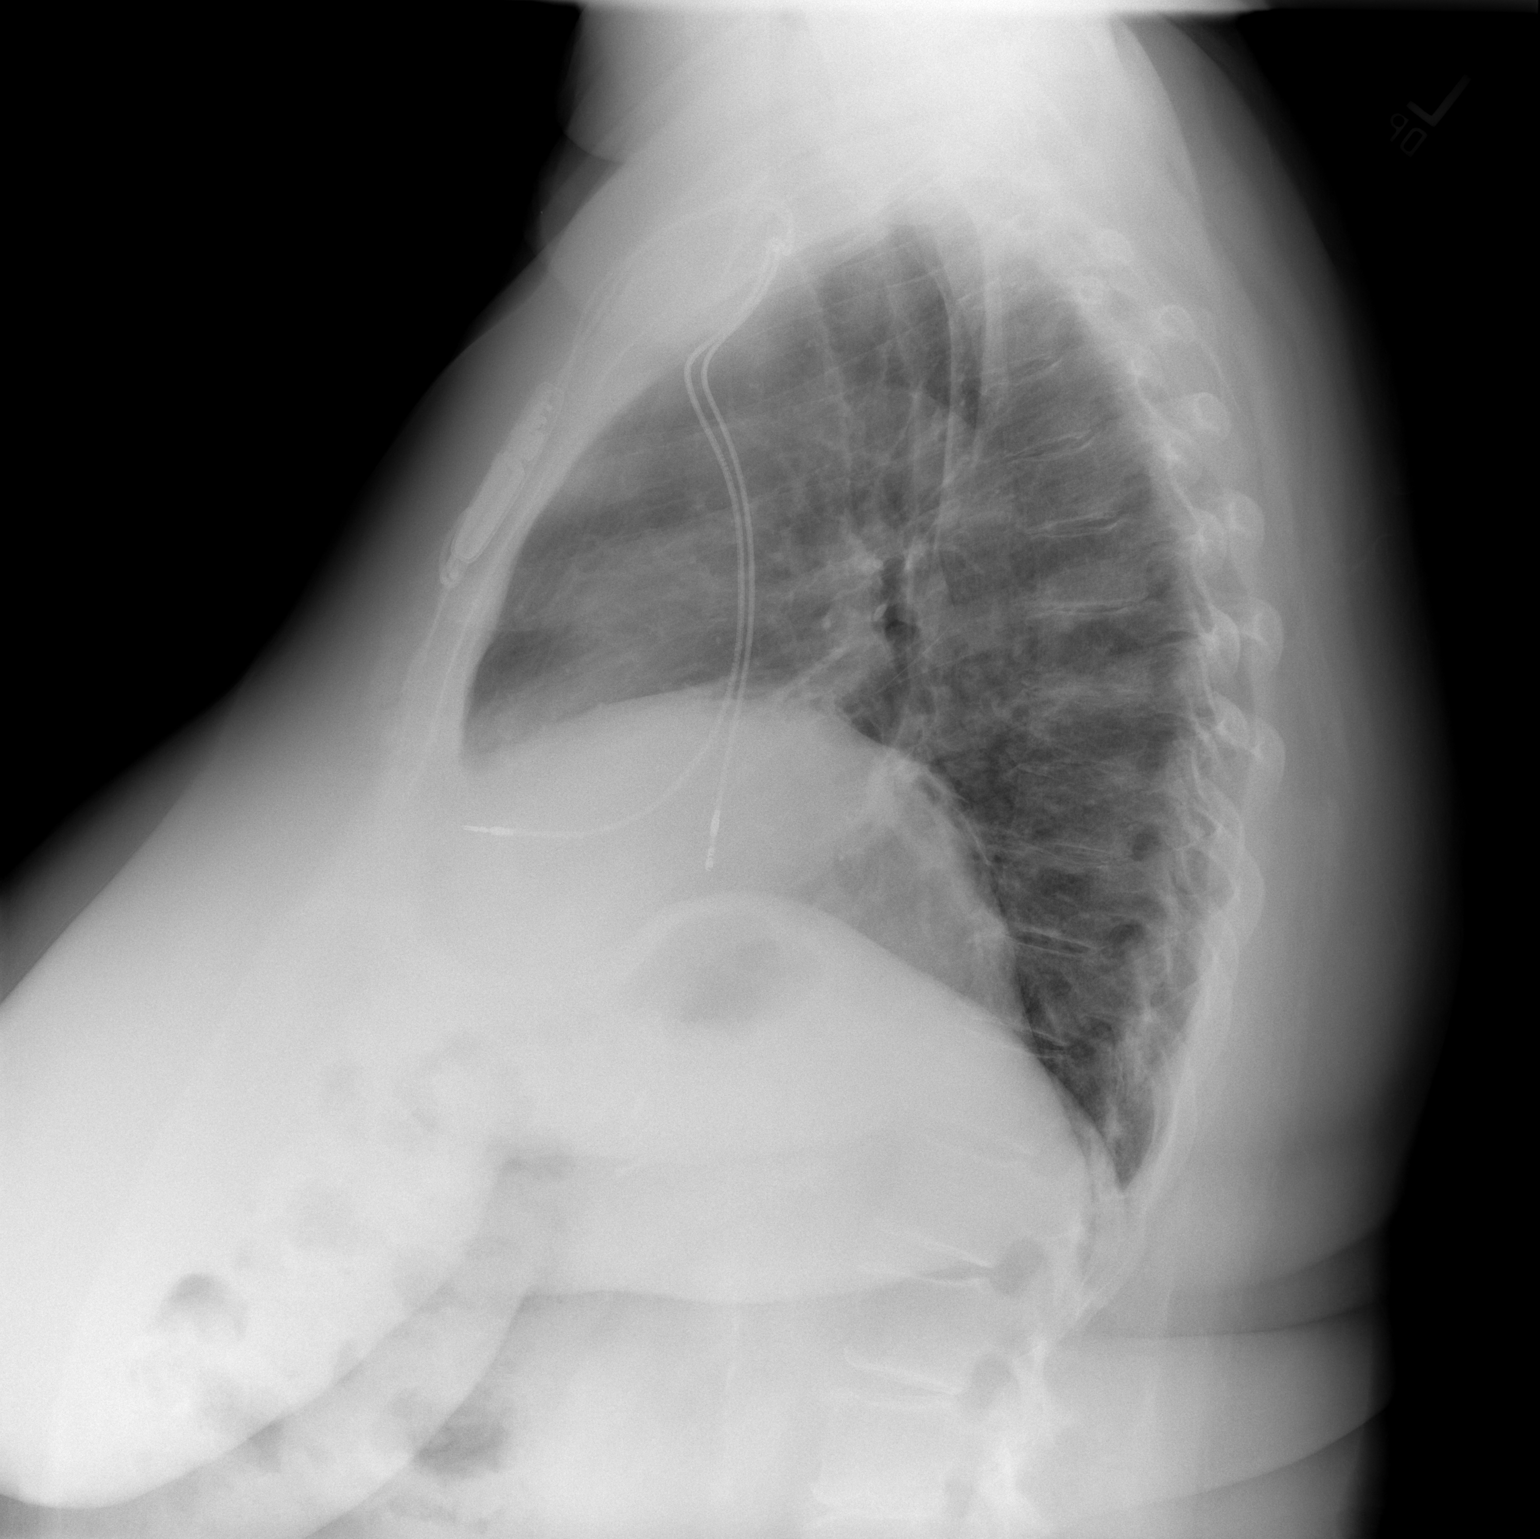

[2 of 2 positions shown; findings below may reference images not displayed]

FINDINGS: Sequential pacemaker in place entering from the left with leads in
the region of the right atrium and right ventricle. Heart size
within normal limits.

Chronic elevated right hemidiaphragm right base subsegmental
atelectasis/ scarring. No infiltrate, congestive heart failure or
pneumothorax.

No plain film evidence of pulmonary malignancy.

Mild scoliosis thoracic spine.
IMPRESSION: Sequential pacemaker leads appear similar to prior exam in the
region of the right atrium and right ventricle. Remainder of
findings unchanged.

## 2019-05-31 NOTE — Progress Notes (Signed)
Remote pacemaker transmission.   

## 2019-07-06 DIAGNOSIS — K219 Gastro-esophageal reflux disease without esophagitis: Secondary | ICD-10-CM | POA: Diagnosis not present

## 2019-07-06 DIAGNOSIS — Z6841 Body Mass Index (BMI) 40.0 and over, adult: Secondary | ICD-10-CM | POA: Diagnosis not present

## 2019-07-06 DIAGNOSIS — G43009 Migraine without aura, not intractable, without status migrainosus: Secondary | ICD-10-CM | POA: Diagnosis not present

## 2019-07-06 DIAGNOSIS — G4733 Obstructive sleep apnea (adult) (pediatric): Secondary | ICD-10-CM | POA: Diagnosis not present

## 2019-07-06 DIAGNOSIS — E039 Hypothyroidism, unspecified: Secondary | ICD-10-CM | POA: Diagnosis not present

## 2019-07-06 DIAGNOSIS — I420 Dilated cardiomyopathy: Secondary | ICD-10-CM | POA: Diagnosis not present

## 2019-07-06 DIAGNOSIS — Z0000 Encounter for general adult medical examination without abnormal findings: Secondary | ICD-10-CM | POA: Diagnosis not present

## 2019-07-06 DIAGNOSIS — I10 Essential (primary) hypertension: Secondary | ICD-10-CM | POA: Diagnosis not present

## 2019-07-06 DIAGNOSIS — Z1389 Encounter for screening for other disorder: Secondary | ICD-10-CM | POA: Diagnosis not present

## 2019-07-06 DIAGNOSIS — Z950 Presence of cardiac pacemaker: Secondary | ICD-10-CM | POA: Diagnosis not present

## 2019-07-06 DIAGNOSIS — Z86010 Personal history of colonic polyps: Secondary | ICD-10-CM | POA: Diagnosis not present

## 2019-07-06 DIAGNOSIS — E559 Vitamin D deficiency, unspecified: Secondary | ICD-10-CM | POA: Diagnosis not present

## 2019-07-13 ENCOUNTER — Other Ambulatory Visit: Payer: Self-pay | Admitting: Internal Medicine

## 2019-07-13 DIAGNOSIS — E2839 Other primary ovarian failure: Secondary | ICD-10-CM

## 2019-08-16 ENCOUNTER — Ambulatory Visit (INDEPENDENT_AMBULATORY_CARE_PROVIDER_SITE_OTHER): Payer: Medicare HMO | Admitting: *Deleted

## 2019-08-16 DIAGNOSIS — I441 Atrioventricular block, second degree: Secondary | ICD-10-CM | POA: Diagnosis not present

## 2019-08-16 LAB — CUP PACEART REMOTE DEVICE CHECK
Battery Remaining Longevity: 119 mo
Battery Remaining Percentage: 95.5 %
Battery Voltage: 2.99 V
Brady Statistic AP VP Percent: 42 %
Brady Statistic AP VS Percent: 1 %
Brady Statistic AS VP Percent: 58 %
Brady Statistic AS VS Percent: 1 %
Brady Statistic RA Percent Paced: 41 %
Brady Statistic RV Percent Paced: 99 %
Date Time Interrogation Session: 20210113020022
Implantable Lead Implant Date: 20190121
Implantable Lead Implant Date: 20190121
Implantable Lead Location: 753859
Implantable Lead Location: 753860
Implantable Pulse Generator Implant Date: 20170928
Lead Channel Impedance Value: 430 Ohm
Lead Channel Impedance Value: 490 Ohm
Lead Channel Pacing Threshold Amplitude: 0.625 V
Lead Channel Pacing Threshold Amplitude: 1 V
Lead Channel Pacing Threshold Pulse Width: 0.5 ms
Lead Channel Pacing Threshold Pulse Width: 0.5 ms
Lead Channel Sensing Intrinsic Amplitude: 1 mV
Lead Channel Sensing Intrinsic Amplitude: 6.4 mV
Lead Channel Setting Pacing Amplitude: 0.875
Lead Channel Setting Pacing Amplitude: 2 V
Lead Channel Setting Pacing Pulse Width: 0.5 ms
Lead Channel Setting Sensing Sensitivity: 4 mV
Pulse Gen Model: 2272
Pulse Gen Serial Number: 7950948

## 2019-08-21 DIAGNOSIS — G4733 Obstructive sleep apnea (adult) (pediatric): Secondary | ICD-10-CM | POA: Diagnosis not present

## 2019-08-24 DIAGNOSIS — Z0100 Encounter for examination of eyes and vision without abnormal findings: Secondary | ICD-10-CM | POA: Diagnosis not present

## 2019-09-13 DIAGNOSIS — Z86010 Personal history of colonic polyps: Secondary | ICD-10-CM | POA: Diagnosis not present

## 2019-09-21 DIAGNOSIS — G4733 Obstructive sleep apnea (adult) (pediatric): Secondary | ICD-10-CM | POA: Diagnosis not present

## 2019-09-27 ENCOUNTER — Other Ambulatory Visit: Payer: Self-pay

## 2019-09-27 ENCOUNTER — Ambulatory Visit
Admission: RE | Admit: 2019-09-27 | Discharge: 2019-09-27 | Disposition: A | Discharge status: Home / Self Care | Payer: Medicare HMO | Source: Ambulatory Visit | Attending: Internal Medicine | Admitting: Internal Medicine

## 2019-09-27 DIAGNOSIS — M85852 Other specified disorders of bone density and structure, left thigh: Secondary | ICD-10-CM | POA: Diagnosis not present

## 2019-09-27 DIAGNOSIS — E2839 Other primary ovarian failure: Secondary | ICD-10-CM

## 2019-09-27 DIAGNOSIS — Z780 Asymptomatic menopausal state: Secondary | ICD-10-CM | POA: Diagnosis not present

## 2019-09-28 DIAGNOSIS — M1711 Unilateral primary osteoarthritis, right knee: Secondary | ICD-10-CM | POA: Diagnosis not present

## 2019-10-12 DIAGNOSIS — Z1159 Encounter for screening for other viral diseases: Secondary | ICD-10-CM | POA: Diagnosis not present

## 2019-10-16 DIAGNOSIS — K648 Other hemorrhoids: Secondary | ICD-10-CM | POA: Diagnosis not present

## 2019-10-16 DIAGNOSIS — Z86010 Personal history of colonic polyps: Secondary | ICD-10-CM | POA: Diagnosis not present

## 2019-10-19 DIAGNOSIS — G4733 Obstructive sleep apnea (adult) (pediatric): Secondary | ICD-10-CM | POA: Diagnosis not present

## 2019-11-15 ENCOUNTER — Ambulatory Visit (INDEPENDENT_AMBULATORY_CARE_PROVIDER_SITE_OTHER): Payer: Medicare HMO | Admitting: *Deleted

## 2019-11-15 DIAGNOSIS — I441 Atrioventricular block, second degree: Secondary | ICD-10-CM | POA: Diagnosis not present

## 2019-11-15 LAB — CUP PACEART REMOTE DEVICE CHECK
Battery Remaining Longevity: 119 mo
Battery Remaining Percentage: 95.5 %
Battery Voltage: 2.99 V
Brady Statistic AP VP Percent: 43 %
Brady Statistic AP VS Percent: 1 %
Brady Statistic AS VP Percent: 55 %
Brady Statistic AS VS Percent: 1.2 %
Brady Statistic RA Percent Paced: 43 %
Brady Statistic RV Percent Paced: 98 %
Date Time Interrogation Session: 20210414024829
Implantable Lead Implant Date: 20190121
Implantable Lead Implant Date: 20190121
Implantable Lead Location: 753859
Implantable Lead Location: 753860
Implantable Pulse Generator Implant Date: 20170928
Lead Channel Impedance Value: 430 Ohm
Lead Channel Impedance Value: 490 Ohm
Lead Channel Pacing Threshold Amplitude: 0.625 V
Lead Channel Pacing Threshold Amplitude: 0.75 V
Lead Channel Pacing Threshold Pulse Width: 0.5 ms
Lead Channel Pacing Threshold Pulse Width: 0.5 ms
Lead Channel Sensing Intrinsic Amplitude: 1 mV
Lead Channel Sensing Intrinsic Amplitude: 8.5 mV
Lead Channel Setting Pacing Amplitude: 1 V
Lead Channel Setting Pacing Amplitude: 2 V
Lead Channel Setting Pacing Pulse Width: 0.5 ms
Lead Channel Setting Sensing Sensitivity: 4 mV
Pulse Gen Model: 2272
Pulse Gen Serial Number: 7950948

## 2019-11-15 NOTE — Progress Notes (Signed)
PPM Remote  

## 2019-11-21 NOTE — Progress Notes (Signed)
Cardiology Office Note   Date:  11/22/2019   ID:  [address] [place], [place] 09-01-1950, MRN [id number]  PCP:  Marden Noble, MD    No chief complaint on file.  Chronic diastolic heart failure  Wt Readings from Last 3 Encounters:  11/22/19 218 lb 12.8 oz (99.2 kg)  08/17/18 241 lb (109.3 kg)  12/13/17 241 lb (109.3 kg)       History of Present Illness: Kelly Castaneda is a 69 y.o. female  Who has had systolic dysfunction in the past. She had syncope and monitor showed symptomatic pauses. She was admitted for bradycardia. Her bradycardia resolved initially with stopping carvedilol.  Ultimately, EF improved to 50-55% in 2017, at the time of her pacer.    She also required a pacemaker and RV lead revision in 4/18, due to Mobitz type II heart block.   She has had B12 deficiency in the past.   Denies : Chest pain. Dizziness. Leg edema. Nitroglycerin use. Orthopnea. Palpitations. Paroxysmal nocturnal dyspnea. Shortness of breath. Syncope.   Intentional weight loss.  3x/week exercise. Using weight watchers.    Colonoscopy in 3/21 was clear.    Past Medical History:  Diagnosis Date  . Arthritis    "wrists, right knee" (11/06/2016)  . Bell's palsy   . Chronic diastolic CHF (congestive heart failure) (HCC)    a. 11/2015 Echo: EF 50-55%, Gr1 DD, mildly dil LA.  Marland Kitchen Complete heart block (HCC)    mobitz II second degree AV  . Essential hypertension   . Family history of adverse reaction to anesthesia    "had a little trouble waking my brother up; dx'd sleep apnea"  . GERD (gastroesophageal reflux disease)   . H/O hiatal hernia   . History of kidney stones   . Hypothyroidism    "not on RX" (11/06/2016)  . Migraine    "none in years" (11/06/2016)  . OSA on CPAP    "I wear nose pillows"  . Presence of permanent cardiac pacemaker   . Shingles   . Syncope    a. 11/2015 - subsequent event monitoring revealed CHF in setting of beta blocker therapy - resolved with  discontinuation of coreg.    Past Surgical History:  Procedure Laterality Date  . BACK SURGERY    . BREAST BIOPSY Right 1990s   biopsy with titanium clip placement  . CARDIAC CATHETERIZATION     "years ago"  . CARDIAC CATHETERIZATION N/A 01/03/2016   Procedure: Temporary Pacemaker;  Surgeon: Lennette Bihari, MD;  [facility]: [facility];  Service: Cardiovascular;  Laterality: N/A;  . DILATION AND CURETTAGE OF UTERUS    . EP IMPLANTABLE DEVICE N/A 04/30/2016   SJM Assurity MRI PPM implanted for transient complete heart block by Dr Johney Frame  . INSERT / REPLACE / REMOVE PACEMAKER    . JOINT REPLACEMENT    . LAPAROSCOPIC CHOLECYSTECTOMY  ~ 1990  . LEAD REVISION  11/06/2016   pacemaker  . LEAD REVISION/REPAIR N/A 11/06/2016   Procedure: Lead Revision/Repair;  Surgeon: Hillis Range, MD;  [facility]: [facility];  Service: Cardiovascular;  Laterality: N/A;  . LEAD REVISION/REPAIR N/A 08/23/2017   Procedure: LEAD REVISION/REPAIR;  Surgeon: Marinus Maw, MD;  [facility]: [facility];  Service: Cardiovascular;  Laterality: N/A;  . LITHOTRIPSY  1990s   "I've had a couple of those" (11/06/2016)  . LUMBAR DISC SURGERY  ~ 2007  . PARATHYROIDECTOMY Right 1990s  . TOTAL KNEE ARTHROPLASTY Left 09/25/2013  Procedure: LEFT TOTAL KNEE ARTHROPLASTY;  Surgeon: [name], MD;  Location: [facility];  Service: Orthopedics;  Laterality: Left;  . TUBAL LIGATION    . VAGINAL HYSTERECTOMY  1996     Current Outpatient Medications  Medication Sig Dispense Refill  . amLODipine (NORVASC) 5 MG tablet Take 1 tablet (5 mg total) by mouth daily. 30 tablet 6  . aspirin EC 81 MG tablet Take 81 mg by mouth daily.    . carvedilol (COREG) 3.125 MG tablet Take 1 tablet (3.125 mg total) by mouth 2 (two) times daily with a meal. 180 tablet 3  . Cholecalciferol (VITAMIN D) 2000 UNITS tablet Take 2,000 Units by mouth daily.    . Cyanocobalamin (VITAMIN B 12 PO) Take 100 mcg by mouth daily.    . furosemide  (LASIX) 20 MG tablet Take 1 tablet (20 mg total) by mouth daily. 30 tablet 6  . losartan (COZAAR) 100 MG tablet Take 100 mg by mouth every morning.    . naproxen sodium (ANAPROX) 220 MG tablet Take 440 mg by mouth daily as needed (arthritis).    Marland Kitchen omeprazole (PRILOSEC) 20 MG capsule Take 20 mg by mouth daily. For heartburn     No current facility-administered medications for this visit.    Allergies:   Codeine, Fentanyl, Morphine, and Morphine and related    Social History:  The patient  reports that she has never smoked. She has never used smokeless tobacco. She reports that she does not drink alcohol or use drugs.   Family History:  The patient's family history includes CAD in her father; Diabetes in her mother; Heart attack in her father; Hypertension in her father and mother.    ROS:  Please see the history of present illness.   Otherwise, review of systems are positive for intentional weight loss.   All other systems are reviewed and negative.    PHYSICAL EXAM: VS:  BP 122/78   Pulse 69   Ht 5\' 4"  (1.626 m)   Wt 218 lb 12.8 oz (99.2 kg)   SpO2 95%   BMI 37.56 kg/m  , BMI Body mass index is 37.56 kg/m. GEN: Well nourished, well developed, in no acute distress  HEENT: normal  Neck: no JVD, carotid bruits, or masses Cardiac: RRR; no murmurs, rubs, or gallops,no edema  Respiratory:  clear to auscultation bilaterally, normal work of breathing GI: soft, nontender, nondistended, + BS MS: no deformity or atrophy  Skin: warm and dry, no rash Neuro:  Strength and sensation are intact Psych: euthymic mood, full affect   EKG:   The ekg ordered today demonstrates NSR, intermittent atrial pacing   Recent Labs: No results found for requested labs within last 8760 hours.   Lipid Panel No results found for: CHOL, TRIG, HDL, CHOLHDL, VLDL, LDLCALC, LDLDIRECT   Other studies Reviewed: Additional studies/ records that were reviewed today with results demonstrating: PMD labs  reviewed.   ASSESSMENT AND PLAN:  1. Pacemaker: Checked with EP.  2. Systolic heart failure: resolved. Chronic diastolic heart failure- now using Lasix as needed.  No issues with volume overload.  Improved with weight loss.  3. HTN: The current medical regimen is effective;  continue present plan and medications. 4. Morbid obesity: Significantly improved since the last visit.  I congratulated her and encouraged her to continue the same habits.     Current medicines are reviewed at length with the patient today.  The patient concerns regarding her medicines were addressed.  The following  changes have been made:  No change  Labs/ tests ordered today include:  No orders of the defined types were placed in this encounter.   Recommend 150 minutes/week of aerobic exercise Low fat, low carb, high fiber diet recommended  Disposition:   FU in 1 year   Signed, Larae Grooms, MD  11/22/2019 9:48 AM    [facility] [place], [place], [place]  [address] Phone: [phone]; Fax: [phone]

## 2019-11-22 ENCOUNTER — Ambulatory Visit: Payer: Medicare HMO | Admitting: Interventional Cardiology

## 2019-11-22 ENCOUNTER — Other Ambulatory Visit: Payer: Self-pay

## 2019-11-22 ENCOUNTER — Encounter: Payer: Self-pay | Admitting: Interventional Cardiology

## 2019-11-22 VITALS — BP 122/78 | HR 69 | Ht 64.0 in | Wt 218.8 lb

## 2019-11-22 DIAGNOSIS — I10 Essential (primary) hypertension: Secondary | ICD-10-CM

## 2019-11-22 DIAGNOSIS — Z950 Presence of cardiac pacemaker: Secondary | ICD-10-CM

## 2019-11-22 DIAGNOSIS — I5032 Chronic diastolic (congestive) heart failure: Secondary | ICD-10-CM | POA: Diagnosis not present

## 2019-11-22 NOTE — Patient Instructions (Signed)
Medication Instructions:  Your physician recommends that you continue on your current medications as directed. Please refer to the Current Medication list given to you today.  *If you need a refill on your cardiac medications before your next appointment, please call your pharmacy*   Lab Work: None ordered  If you have labs (blood work) drawn today and your tests are completely normal, you will receive your results only by: . MyChart Message (if you have MyChart) OR . A paper copy in the mail If you have any lab test that is abnormal or we need to change your treatment, we will call you to review the results.   Testing/Procedures: None ordered   Follow-Up: At [facility], you and your health needs are our priority.  As part of our continuing mission to provide you with exceptional heart care, we have created designated Provider Care Teams.  These Care Teams include your primary Cardiologist (physician) and Advanced Practice Providers (APPs -  Physician Assistants and Nurse Practitioners) who all work together to provide you with the care you need, when you need it.  We recommend signing up for the patient portal called "MyChart".  Sign up information is provided on this After Visit Summary.  MyChart is used to connect with patients for Virtual Visits (Telemedicine).  Patients are able to view lab/test results, encounter notes, upcoming appointments, etc.  Non-urgent messages can be sent to your provider as well.   To learn more about what you can do with MyChart, go to [email or web address].    Your next appointment:   12 month(s)  The format for your next appointment:   In Person  Provider:   You may see Jay Varanasi, MD or one of the following Advanced Practice Providers on your designated Care Team:    Dayna Dunn, PA-C  Michele Lenze, PA-C    Other Instructions   

## 2020-02-14 ENCOUNTER — Ambulatory Visit (INDEPENDENT_AMBULATORY_CARE_PROVIDER_SITE_OTHER): Payer: Medicare HMO | Admitting: *Deleted

## 2020-02-14 DIAGNOSIS — I441 Atrioventricular block, second degree: Secondary | ICD-10-CM | POA: Diagnosis not present

## 2020-02-14 DIAGNOSIS — G43009 Migraine without aura, not intractable, without status migrainosus: Secondary | ICD-10-CM | POA: Diagnosis not present

## 2020-02-14 DIAGNOSIS — M179 Osteoarthritis of knee, unspecified: Secondary | ICD-10-CM | POA: Diagnosis not present

## 2020-02-14 DIAGNOSIS — I10 Essential (primary) hypertension: Secondary | ICD-10-CM | POA: Diagnosis not present

## 2020-02-14 DIAGNOSIS — E039 Hypothyroidism, unspecified: Secondary | ICD-10-CM | POA: Diagnosis not present

## 2020-02-14 LAB — CUP PACEART REMOTE DEVICE CHECK
Battery Remaining Longevity: 118 mo
Battery Remaining Percentage: 95.5 %
Battery Voltage: 2.99 V
Brady Statistic AP VP Percent: 46 %
Brady Statistic AP VS Percent: 1 %
Brady Statistic AS VP Percent: 51 %
Brady Statistic AS VS Percent: 1.9 %
Brady Statistic RA Percent Paced: 46 %
Brady Statistic RV Percent Paced: 97 %
Date Time Interrogation Session: 20210714032641
Implantable Lead Implant Date: 20190121
Implantable Lead Implant Date: 20190121
Implantable Lead Location: 753859
Implantable Lead Location: 753860
Implantable Pulse Generator Implant Date: 20170928
Lead Channel Impedance Value: 440 Ohm
Lead Channel Impedance Value: 510 Ohm
Lead Channel Pacing Threshold Amplitude: 0.625 V
Lead Channel Pacing Threshold Amplitude: 0.875 V
Lead Channel Pacing Threshold Pulse Width: 0.5 ms
Lead Channel Pacing Threshold Pulse Width: 0.5 ms
Lead Channel Sensing Intrinsic Amplitude: 2.3 mV
Lead Channel Sensing Intrinsic Amplitude: 5.5 mV
Lead Channel Setting Pacing Amplitude: 1.125
Lead Channel Setting Pacing Amplitude: 2 V
Lead Channel Setting Pacing Pulse Width: 0.5 ms
Lead Channel Setting Sensing Sensitivity: 4 mV
Pulse Gen Model: 2272
Pulse Gen Serial Number: 7950948

## 2020-02-15 NOTE — Progress Notes (Signed)
Remote pacemaker transmission.   

## 2020-03-04 ENCOUNTER — Other Ambulatory Visit: Payer: Self-pay | Admitting: Internal Medicine

## 2020-03-04 DIAGNOSIS — Z1231 Encounter for screening mammogram for malignant neoplasm of breast: Secondary | ICD-10-CM

## 2020-03-20 DIAGNOSIS — M1711 Unilateral primary osteoarthritis, right knee: Secondary | ICD-10-CM | POA: Diagnosis not present

## 2020-03-26 DIAGNOSIS — H5213 Myopia, bilateral: Secondary | ICD-10-CM | POA: Diagnosis not present

## 2020-04-03 DIAGNOSIS — G43009 Migraine without aura, not intractable, without status migrainosus: Secondary | ICD-10-CM | POA: Diagnosis not present

## 2020-04-03 DIAGNOSIS — E039 Hypothyroidism, unspecified: Secondary | ICD-10-CM | POA: Diagnosis not present

## 2020-04-03 DIAGNOSIS — M179 Osteoarthritis of knee, unspecified: Secondary | ICD-10-CM | POA: Diagnosis not present

## 2020-04-03 DIAGNOSIS — I10 Essential (primary) hypertension: Secondary | ICD-10-CM | POA: Diagnosis not present

## 2020-04-18 ENCOUNTER — Other Ambulatory Visit: Payer: Self-pay

## 2020-04-18 ENCOUNTER — Ambulatory Visit
Admission: RE | Admit: 2020-04-18 | Discharge: 2020-04-18 | Disposition: A | Discharge status: Home / Self Care | Payer: Medicare HMO | Source: Ambulatory Visit | Attending: Internal Medicine | Admitting: Internal Medicine

## 2020-04-18 DIAGNOSIS — Z1231 Encounter for screening mammogram for malignant neoplasm of breast: Secondary | ICD-10-CM

## 2020-04-30 DIAGNOSIS — Z03818 Encounter for observation for suspected exposure to other biological agents ruled out: Secondary | ICD-10-CM | POA: Diagnosis not present

## 2020-05-01 DIAGNOSIS — Z20828 Contact with and (suspected) exposure to other viral communicable diseases: Secondary | ICD-10-CM | POA: Diagnosis not present

## 2020-05-06 DIAGNOSIS — M179 Osteoarthritis of knee, unspecified: Secondary | ICD-10-CM | POA: Diagnosis not present

## 2020-05-06 DIAGNOSIS — E039 Hypothyroidism, unspecified: Secondary | ICD-10-CM | POA: Diagnosis not present

## 2020-05-06 DIAGNOSIS — I10 Essential (primary) hypertension: Secondary | ICD-10-CM | POA: Diagnosis not present

## 2020-05-06 DIAGNOSIS — G43009 Migraine without aura, not intractable, without status migrainosus: Secondary | ICD-10-CM | POA: Diagnosis not present

## 2020-05-13 DIAGNOSIS — R69 Illness, unspecified: Secondary | ICD-10-CM | POA: Diagnosis not present

## 2020-05-15 ENCOUNTER — Ambulatory Visit (INDEPENDENT_AMBULATORY_CARE_PROVIDER_SITE_OTHER): Payer: Medicare HMO

## 2020-05-15 DIAGNOSIS — I441 Atrioventricular block, second degree: Secondary | ICD-10-CM | POA: Diagnosis not present

## 2020-05-17 DIAGNOSIS — R69 Illness, unspecified: Secondary | ICD-10-CM | POA: Diagnosis not present

## 2020-05-17 LAB — CUP PACEART REMOTE DEVICE CHECK
Battery Remaining Longevity: 118 mo
Battery Remaining Percentage: 95.5 %
Battery Voltage: 2.98 V
Brady Statistic AP VP Percent: 48 %
Brady Statistic AP VS Percent: 1 %
Brady Statistic AS VP Percent: 49 %
Brady Statistic AS VS Percent: 2.8 %
Brady Statistic RA Percent Paced: 47 %
Brady Statistic RV Percent Paced: 96 %
Date Time Interrogation Session: 20211013020859
Implantable Lead Implant Date: 20190121
Implantable Lead Implant Date: 20190121
Implantable Lead Location: 753859
Implantable Lead Location: 753860
Implantable Pulse Generator Implant Date: 20170928
Lead Channel Impedance Value: 430 Ohm
Lead Channel Impedance Value: 510 Ohm
Lead Channel Pacing Threshold Amplitude: 0.5 V
Lead Channel Pacing Threshold Amplitude: 0.75 V
Lead Channel Pacing Threshold Pulse Width: 0.5 ms
Lead Channel Pacing Threshold Pulse Width: 0.5 ms
Lead Channel Sensing Intrinsic Amplitude: 1.4 mV
Lead Channel Sensing Intrinsic Amplitude: 6 mV
Lead Channel Setting Pacing Amplitude: 1 V
Lead Channel Setting Pacing Amplitude: 2 V
Lead Channel Setting Pacing Pulse Width: 0.5 ms
Lead Channel Setting Sensing Sensitivity: 4 mV
Pulse Gen Model: 2272
Pulse Gen Serial Number: 7950948

## 2020-05-21 NOTE — Progress Notes (Signed)
Remote pacemaker transmission.   

## 2020-07-15 DIAGNOSIS — Z1389 Encounter for screening for other disorder: Secondary | ICD-10-CM | POA: Diagnosis not present

## 2020-07-15 DIAGNOSIS — Z6841 Body Mass Index (BMI) 40.0 and over, adult: Secondary | ICD-10-CM | POA: Diagnosis not present

## 2020-07-15 DIAGNOSIS — E039 Hypothyroidism, unspecified: Secondary | ICD-10-CM | POA: Diagnosis not present

## 2020-07-15 DIAGNOSIS — Z79899 Other long term (current) drug therapy: Secondary | ICD-10-CM | POA: Diagnosis not present

## 2020-07-15 DIAGNOSIS — G43009 Migraine without aura, not intractable, without status migrainosus: Secondary | ICD-10-CM | POA: Diagnosis not present

## 2020-07-15 DIAGNOSIS — I10 Essential (primary) hypertension: Secondary | ICD-10-CM | POA: Diagnosis not present

## 2020-07-15 DIAGNOSIS — I420 Dilated cardiomyopathy: Secondary | ICD-10-CM | POA: Diagnosis not present

## 2020-07-15 DIAGNOSIS — Z0000 Encounter for general adult medical examination without abnormal findings: Secondary | ICD-10-CM | POA: Diagnosis not present

## 2020-07-15 DIAGNOSIS — E559 Vitamin D deficiency, unspecified: Secondary | ICD-10-CM | POA: Diagnosis not present

## 2020-08-03 DIAGNOSIS — U071 COVID-19: Secondary | ICD-10-CM | POA: Diagnosis not present

## 2020-08-14 ENCOUNTER — Ambulatory Visit (INDEPENDENT_AMBULATORY_CARE_PROVIDER_SITE_OTHER): Payer: Medicare HMO

## 2020-08-14 DIAGNOSIS — I441 Atrioventricular block, second degree: Secondary | ICD-10-CM | POA: Diagnosis not present

## 2020-08-16 ENCOUNTER — Telehealth: Payer: Self-pay

## 2020-08-16 LAB — CUP PACEART REMOTE DEVICE CHECK
Battery Remaining Longevity: 116 mo
Battery Remaining Percentage: 95.5 %
Battery Voltage: 2.98 V
Brady Statistic AP VP Percent: 48 %
Brady Statistic AP VS Percent: 1 %
Brady Statistic AS VP Percent: 48 %
Brady Statistic AS VS Percent: 2.7 %
Brady Statistic RA Percent Paced: 48 %
Brady Statistic RV Percent Paced: 96 %
Date Time Interrogation Session: 20220112155347
Implantable Lead Implant Date: 20190121
Implantable Lead Implant Date: 20190121
Implantable Lead Location: 753859
Implantable Lead Location: 753860
Implantable Pulse Generator Implant Date: 20170928
Lead Channel Impedance Value: 440 Ohm
Lead Channel Impedance Value: 490 Ohm
Lead Channel Pacing Threshold Amplitude: 0.75 V
Lead Channel Pacing Threshold Amplitude: 0.875 V
Lead Channel Pacing Threshold Pulse Width: 0.5 ms
Lead Channel Pacing Threshold Pulse Width: 0.5 ms
Lead Channel Sensing Intrinsic Amplitude: 1.3 mV
Lead Channel Sensing Intrinsic Amplitude: 8.5 mV
Lead Channel Setting Pacing Amplitude: 1 V
Lead Channel Setting Pacing Amplitude: 2 V
Lead Channel Setting Pacing Pulse Width: 0.5 ms
Lead Channel Setting Sensing Sensitivity: 4 mV
Pulse Gen Model: 2272
Pulse Gen Serial Number: 7950948

## 2020-08-16 NOTE — Telephone Encounter (Signed)
The patient would like for someone to call her about her 08/14/2020 transmission results. I told her the nurse will give her a call back.

## 2020-08-16 NOTE — Telephone Encounter (Signed)
Spoke with patient, advised of normal device function.  There have been some episodes of HVR.  If any concerns when reviewed by MD we will contact her.

## 2020-08-24 DIAGNOSIS — I10 Essential (primary) hypertension: Secondary | ICD-10-CM | POA: Diagnosis not present

## 2020-08-24 DIAGNOSIS — G43009 Migraine without aura, not intractable, without status migrainosus: Secondary | ICD-10-CM | POA: Diagnosis not present

## 2020-08-24 DIAGNOSIS — K219 Gastro-esophageal reflux disease without esophagitis: Secondary | ICD-10-CM | POA: Diagnosis not present

## 2020-08-24 DIAGNOSIS — E039 Hypothyroidism, unspecified: Secondary | ICD-10-CM | POA: Diagnosis not present

## 2020-08-24 DIAGNOSIS — M179 Osteoarthritis of knee, unspecified: Secondary | ICD-10-CM | POA: Diagnosis not present

## 2020-08-28 NOTE — Progress Notes (Signed)
Remote pacemaker transmission.   

## 2020-08-30 DIAGNOSIS — M1711 Unilateral primary osteoarthritis, right knee: Secondary | ICD-10-CM | POA: Diagnosis not present

## 2020-11-13 ENCOUNTER — Ambulatory Visit (INDEPENDENT_AMBULATORY_CARE_PROVIDER_SITE_OTHER): Payer: Medicare HMO

## 2020-11-13 DIAGNOSIS — I441 Atrioventricular block, second degree: Secondary | ICD-10-CM | POA: Diagnosis not present

## 2020-11-15 LAB — CUP PACEART REMOTE DEVICE CHECK
Battery Remaining Longevity: 116 mo
Battery Remaining Percentage: 95.5 %
Battery Voltage: 2.98 V
Brady Statistic AP VP Percent: 49 %
Brady Statistic AP VS Percent: 1 %
Brady Statistic AS VP Percent: 48 %
Brady Statistic AS VS Percent: 2.4 %
Brady Statistic RA Percent Paced: 49 %
Brady Statistic RV Percent Paced: 97 %
Date Time Interrogation Session: 20220413113631
Implantable Lead Implant Date: 20190121
Implantable Lead Implant Date: 20190121
Implantable Lead Location: 753859
Implantable Lead Location: 753860
Implantable Pulse Generator Implant Date: 20170928
Lead Channel Impedance Value: 440 Ohm
Lead Channel Impedance Value: 510 Ohm
Lead Channel Pacing Threshold Amplitude: 0.625 V
Lead Channel Pacing Threshold Amplitude: 1 V
Lead Channel Pacing Threshold Pulse Width: 0.5 ms
Lead Channel Pacing Threshold Pulse Width: 0.5 ms
Lead Channel Sensing Intrinsic Amplitude: 1.3 mV
Lead Channel Sensing Intrinsic Amplitude: 12 mV
Lead Channel Setting Pacing Amplitude: 1.25 V
Lead Channel Setting Pacing Amplitude: 2 V
Lead Channel Setting Pacing Pulse Width: 0.5 ms
Lead Channel Setting Sensing Sensitivity: 4 mV
Pulse Gen Model: 2272
Pulse Gen Serial Number: 7950948

## 2020-11-22 DIAGNOSIS — M1711 Unilateral primary osteoarthritis, right knee: Secondary | ICD-10-CM | POA: Diagnosis not present

## 2020-11-27 NOTE — Progress Notes (Signed)
Remote pacemaker transmission.   

## 2020-12-26 DIAGNOSIS — M179 Osteoarthritis of knee, unspecified: Secondary | ICD-10-CM | POA: Diagnosis not present

## 2020-12-26 DIAGNOSIS — E039 Hypothyroidism, unspecified: Secondary | ICD-10-CM | POA: Diagnosis not present

## 2020-12-26 DIAGNOSIS — G43009 Migraine without aura, not intractable, without status migrainosus: Secondary | ICD-10-CM | POA: Diagnosis not present

## 2020-12-26 DIAGNOSIS — I10 Essential (primary) hypertension: Secondary | ICD-10-CM | POA: Diagnosis not present

## 2020-12-26 DIAGNOSIS — K219 Gastro-esophageal reflux disease without esophagitis: Secondary | ICD-10-CM | POA: Diagnosis not present

## 2021-01-01 ENCOUNTER — Other Ambulatory Visit: Payer: Self-pay | Admitting: Internal Medicine

## 2021-01-01 DIAGNOSIS — Z1231 Encounter for screening mammogram for malignant neoplasm of breast: Secondary | ICD-10-CM

## 2021-01-24 DIAGNOSIS — K219 Gastro-esophageal reflux disease without esophagitis: Secondary | ICD-10-CM | POA: Diagnosis not present

## 2021-01-24 DIAGNOSIS — G43009 Migraine without aura, not intractable, without status migrainosus: Secondary | ICD-10-CM | POA: Diagnosis not present

## 2021-01-24 DIAGNOSIS — I10 Essential (primary) hypertension: Secondary | ICD-10-CM | POA: Diagnosis not present

## 2021-01-24 DIAGNOSIS — E039 Hypothyroidism, unspecified: Secondary | ICD-10-CM | POA: Diagnosis not present

## 2021-01-24 DIAGNOSIS — M179 Osteoarthritis of knee, unspecified: Secondary | ICD-10-CM | POA: Diagnosis not present

## 2021-02-12 ENCOUNTER — Ambulatory Visit (INDEPENDENT_AMBULATORY_CARE_PROVIDER_SITE_OTHER): Payer: Medicare HMO

## 2021-02-12 DIAGNOSIS — I441 Atrioventricular block, second degree: Secondary | ICD-10-CM | POA: Diagnosis not present

## 2021-02-12 LAB — CUP PACEART REMOTE DEVICE CHECK
Battery Remaining Longevity: 54 mo
Battery Remaining Percentage: 49 %
Battery Voltage: 2.98 V
Brady Statistic AP VP Percent: 50 %
Brady Statistic AP VS Percent: 1 %
Brady Statistic AS VP Percent: 47 %
Brady Statistic AS VS Percent: 2.2 %
Brady Statistic RA Percent Paced: 50 %
Brady Statistic RV Percent Paced: 97 %
Date Time Interrogation Session: 20220713041853
Implantable Lead Implant Date: 20190121
Implantable Lead Implant Date: 20190121
Implantable Lead Location: 753859
Implantable Lead Location: 753860
Implantable Pulse Generator Implant Date: 20170928
Lead Channel Impedance Value: 430 Ohm
Lead Channel Impedance Value: 450 Ohm
Lead Channel Pacing Threshold Amplitude: 0.625 V
Lead Channel Pacing Threshold Amplitude: 1 V
Lead Channel Pacing Threshold Pulse Width: 0.5 ms
Lead Channel Pacing Threshold Pulse Width: 0.5 ms
Lead Channel Sensing Intrinsic Amplitude: 0.9 mV
Lead Channel Sensing Intrinsic Amplitude: 12 mV
Lead Channel Setting Pacing Amplitude: 1.25 V
Lead Channel Setting Pacing Amplitude: 2 V
Lead Channel Setting Pacing Pulse Width: 0.5 ms
Lead Channel Setting Sensing Sensitivity: 4 mV
Pulse Gen Model: 2272
Pulse Gen Serial Number: 7950948

## 2021-02-22 DIAGNOSIS — G4733 Obstructive sleep apnea (adult) (pediatric): Secondary | ICD-10-CM | POA: Diagnosis not present

## 2021-03-06 NOTE — Progress Notes (Signed)
Remote pacemaker transmission.   

## 2021-03-25 DIAGNOSIS — G4733 Obstructive sleep apnea (adult) (pediatric): Secondary | ICD-10-CM | POA: Diagnosis not present

## 2021-03-27 DIAGNOSIS — H179 Unspecified corneal scar and opacity: Secondary | ICD-10-CM | POA: Diagnosis not present

## 2021-03-28 DIAGNOSIS — M1711 Unilateral primary osteoarthritis, right knee: Secondary | ICD-10-CM | POA: Diagnosis not present

## 2021-04-21 ENCOUNTER — Other Ambulatory Visit: Payer: Self-pay

## 2021-04-21 ENCOUNTER — Ambulatory Visit
Admission: RE | Admit: 2021-04-21 | Discharge: 2021-04-21 | Disposition: A | Discharge status: Home / Self Care | Payer: Medicare HMO | Source: Ambulatory Visit | Attending: Internal Medicine | Admitting: Internal Medicine

## 2021-04-21 DIAGNOSIS — Z1231 Encounter for screening mammogram for malignant neoplasm of breast: Secondary | ICD-10-CM

## 2021-04-25 DIAGNOSIS — G4733 Obstructive sleep apnea (adult) (pediatric): Secondary | ICD-10-CM | POA: Diagnosis not present

## 2021-05-14 ENCOUNTER — Other Ambulatory Visit: Payer: Self-pay

## 2021-05-14 ENCOUNTER — Ambulatory Visit (INDEPENDENT_AMBULATORY_CARE_PROVIDER_SITE_OTHER): Payer: Medicare HMO

## 2021-05-14 ENCOUNTER — Ambulatory Visit: Payer: Medicare HMO | Admitting: Internal Medicine

## 2021-05-14 VITALS — BP 144/82 | HR 81 | Ht 63.0 in | Wt 223.4 lb

## 2021-05-14 DIAGNOSIS — I441 Atrioventricular block, second degree: Secondary | ICD-10-CM | POA: Diagnosis not present

## 2021-05-14 DIAGNOSIS — I10 Essential (primary) hypertension: Secondary | ICD-10-CM | POA: Diagnosis not present

## 2021-05-14 DIAGNOSIS — I442 Atrioventricular block, complete: Secondary | ICD-10-CM

## 2021-05-14 DIAGNOSIS — I5032 Chronic diastolic (congestive) heart failure: Secondary | ICD-10-CM

## 2021-05-14 LAB — CUP PACEART INCLINIC DEVICE CHECK
Battery Remaining Longevity: 51 mo
Battery Voltage: 2.98 V
Brady Statistic RA Percent Paced: 51 %
Brady Statistic RV Percent Paced: 97 %
Date Time Interrogation Session: 20221012134845
Implantable Lead Implant Date: 20190121
Implantable Lead Implant Date: 20190121
Implantable Lead Location: 753859
Implantable Lead Location: 753860
Implantable Pulse Generator Implant Date: 20170928
Lead Channel Impedance Value: 450 Ohm
Lead Channel Impedance Value: 512.5 Ohm
Lead Channel Pacing Threshold Amplitude: 0.5 V
Lead Channel Pacing Threshold Amplitude: 0.5 V
Lead Channel Pacing Threshold Amplitude: 1 V
Lead Channel Pacing Threshold Amplitude: 1 V
Lead Channel Pacing Threshold Pulse Width: 0.5 ms
Lead Channel Pacing Threshold Pulse Width: 0.5 ms
Lead Channel Pacing Threshold Pulse Width: 0.5 ms
Lead Channel Pacing Threshold Pulse Width: 0.5 ms
Lead Channel Sensing Intrinsic Amplitude: 1.4 mV
Lead Channel Sensing Intrinsic Amplitude: 12 mV
Lead Channel Setting Pacing Amplitude: 1.125
Lead Channel Setting Pacing Amplitude: 2 V
Lead Channel Setting Pacing Pulse Width: 0.5 ms
Lead Channel Setting Sensing Sensitivity: 4 mV
Pulse Gen Model: 2272
Pulse Gen Serial Number: 7950948

## 2021-05-14 NOTE — Patient Instructions (Addendum)

## 2021-05-14 NOTE — Progress Notes (Signed)
PCP: Marden Noble, MD Primary Cardiologist: Dr Eldridge Dace Primary EP:  Dr Tharon Aquas Kelly Castaneda is a 70 y.o. female who presents today for routine electrophysiology followup.  Since last being seen in our clinic, the patient reports doing very well.  Today, she denies symptoms of palpitations, chest pain, shortness of breath,  lower extremity edema, dizziness, presyncope, or syncope.  The patient is otherwise without complaint today.   Past Medical History:  Diagnosis Date   Arthritis    "wrists, right knee" (11/06/2016)   Bell's palsy    Chronic diastolic CHF (congestive heart failure) (HCC)    a. 11/2015 Echo: EF 50-55%, Gr1 DD, mildly dil LA.   Complete heart block (HCC)    mobitz II second degree AV   Essential hypertension    Family history of adverse reaction to anesthesia    "had a little trouble waking my brother up; dx'd sleep apnea"   GERD (gastroesophageal reflux disease)    H/O hiatal hernia    History of kidney stones    Hypothyroidism    "not on RX" (11/06/2016)   Migraine    "none in years" (11/06/2016)   OSA on CPAP    "I wear nose pillows"   Presence of permanent cardiac pacemaker    Shingles    Syncope    a. 11/2015 - subsequent event monitoring revealed CHF in setting of beta blocker therapy - resolved with discontinuation of coreg.   Past Surgical History:  Procedure Laterality Date   BACK SURGERY     BREAST BIOPSY Right 1990s   biopsy with titanium clip placement   CARDIAC CATHETERIZATION     "years ago"   CARDIAC CATHETERIZATION N/A 01/03/2016   Procedure: Temporary Pacemaker;  Surgeon: Lennette Bihari, MD;  [facility];  Service: Cardiovascular;  Laterality: N/A;   DILATION AND CURETTAGE OF UTERUS     EP IMPLANTABLE DEVICE N/A 04/30/2016   SJM Assurity MRI PPM implanted for transient complete heart block by Dr Cora Daniels / REPLACE / REMOVE PACEMAKER     JOINT REPLACEMENT     LAPAROSCOPIC CHOLECYSTECTOMY  ~ 1990   LEAD REVISION   11/06/2016   pacemaker   LEAD REVISION/REPAIR N/A 11/06/2016   Procedure: Lead Revision/Repair;  Surgeon: Hillis Range, MD;  [facility];  Service: Cardiovascular;  Laterality: N/A;   LEAD REVISION/REPAIR N/A 08/23/2017   Procedure: LEAD REVISION/REPAIR;  Surgeon: Marinus Maw, MD;  [facility];  Service: Cardiovascular;  Laterality: N/A;   LITHOTRIPSY  1990s   "I've had a couple of those" (11/06/2016)   LUMBAR DISC SURGERY  ~ 2007   PARATHYROIDECTOMY Right 1990s   TOTAL KNEE ARTHROPLASTY Left 09/25/2013   Procedure: LEFT TOTAL KNEE ARTHROPLASTY;  Surgeon: [name], MD;  Location: [facility];  Service: Orthopedics;  Laterality: Left;   TUBAL LIGATION     VAGINAL HYSTERECTOMY  1996    ROS- all systems are reviewed and negative except as per HPI above  Current Outpatient Medications  Medication Sig Dispense Refill   amLODipine (NORVASC) 5 MG tablet Take 1 tablet (5 mg total) by mouth daily. 30 tablet 6   aspirin EC 81 MG tablet Take 81 mg by mouth daily.     carvedilol (COREG) 3.125 MG tablet Take 1 tablet (3.125 mg total) by mouth 2 (two) times daily with a meal. 180 tablet 3   Cholecalciferol (VITAMIN D) 2000 UNITS tablet Take 2,000  Units by mouth daily.     Cyanocobalamin (VITAMIN B 12 PO) Take 100 mcg by mouth daily.     furosemide (LASIX) 20 MG tablet Take 1 tablet (20 mg total) by mouth daily. 30 tablet 6   losartan (COZAAR) 100 MG tablet Take 100 mg by mouth every morning.     naproxen sodium (ANAPROX) 220 MG tablet Take 440 mg by mouth daily as needed (arthritis).     omeprazole (PRILOSEC) 20 MG capsule Take 20 mg by mouth daily. For heartburn     No current facility-administered medications for this visit.    Physical Exam: Vitals:   05/14/21 1224  BP: (!) 144/82  Pulse: 81  SpO2: 95%  Weight: 223 lb 6.4 oz (101.3 kg)  Height: 5\' 3"  (1.6 m)    GEN- The patient is well appearing, alert and oriented x 3 today.   Head- normocephalic,  atraumatic Eyes-  Sclera clear, conjunctiva pink Ears- hearing intact Oropharynx- clear Lungs- Clear to ausculation bilaterally, normal work of breathing Chest- pacemaker pocket is well healed Heart- Regular rate and rhythm, no murmurs, rubs or gallops, PMI not laterally displaced GI- soft, NT, ND, + BS Extremities- no clubbing, cyanosis, or edema  Pacemaker interrogation- reviewed in detail today,  See PACEART report  ekg tracing ordered today is personally reviewed and shows sinus with V pacing  Assessment and Plan:  1. Symptomatic complete heart block Normal pacemaker function See Pace Art report No changes today she is device dependant today  2. Chronic diastolic dysfunction Stable No change required today  3. HTN Stable No change required today  4. Obesity Body mass index is 39.57 kg/m. Lifestyle modification is advised  Return to see EP APP annually   MD, [facility] 05/14/2021 12:33 PM

## 2021-05-16 LAB — CUP PACEART REMOTE DEVICE CHECK
Battery Remaining Longevity: 50 mo
Battery Remaining Percentage: 46 %
Battery Voltage: 2.98 V
Brady Statistic AP VP Percent: 51 %
Brady Statistic AP VS Percent: 1 %
Brady Statistic AS VP Percent: 46 %
Brady Statistic AS VS Percent: 2 %
Brady Statistic RA Percent Paced: 51 %
Brady Statistic RV Percent Paced: 97 %
Date Time Interrogation Session: 20221012025712
Implantable Lead Implant Date: 20190121
Implantable Lead Implant Date: 20190121
Implantable Lead Location: 753859
Implantable Lead Location: 753860
Implantable Pulse Generator Implant Date: 20170928
Lead Channel Impedance Value: 430 Ohm
Lead Channel Impedance Value: 450 Ohm
Lead Channel Pacing Threshold Amplitude: 0.875 V
Lead Channel Pacing Threshold Amplitude: 1.125 V
Lead Channel Pacing Threshold Pulse Width: 0.5 ms
Lead Channel Pacing Threshold Pulse Width: 0.5 ms
Lead Channel Sensing Intrinsic Amplitude: 12 mV
Lead Channel Sensing Intrinsic Amplitude: 2 mV
Lead Channel Setting Pacing Amplitude: 1.375
Lead Channel Setting Pacing Amplitude: 2 V
Lead Channel Setting Pacing Pulse Width: 0.5 ms
Lead Channel Setting Sensing Sensitivity: 4 mV
Pulse Gen Model: 2272
Pulse Gen Serial Number: 7950948

## 2021-05-22 NOTE — Progress Notes (Signed)
Remote pacemaker transmission.   

## 2021-05-25 DIAGNOSIS — G4733 Obstructive sleep apnea (adult) (pediatric): Secondary | ICD-10-CM | POA: Diagnosis not present

## 2021-06-04 DIAGNOSIS — G4733 Obstructive sleep apnea (adult) (pediatric): Secondary | ICD-10-CM | POA: Diagnosis not present

## 2021-06-16 DIAGNOSIS — G4733 Obstructive sleep apnea (adult) (pediatric): Secondary | ICD-10-CM | POA: Diagnosis not present

## 2021-06-25 DIAGNOSIS — G4733 Obstructive sleep apnea (adult) (pediatric): Secondary | ICD-10-CM | POA: Diagnosis not present

## 2021-06-30 DIAGNOSIS — M1711 Unilateral primary osteoarthritis, right knee: Secondary | ICD-10-CM | POA: Diagnosis not present

## 2021-07-04 DIAGNOSIS — G4733 Obstructive sleep apnea (adult) (pediatric): Secondary | ICD-10-CM | POA: Diagnosis not present

## 2021-07-11 ENCOUNTER — Telehealth: Payer: Self-pay | Admitting: *Deleted

## 2021-07-11 NOTE — Telephone Encounter (Signed)
   Pre-operative Risk Assessment    Patient Name: Kelly Castaneda  DOB: [birth year] MRN: [id number]      Request for Surgical Clearance   Procedure:   RIGHT TOTAL KNEE ARTHROPLASTY  Date of Surgery: Clearance 11/10/21                                 Surgeon:  DR. Ollen Gross Surgeon's Group or Practice Name:  [facility] Phone number:  [phone] Fax number:  [phone] ATTN: Aida Raider   Type of Clearance Requested: - Medical  - Pharmacy:  Hold Aspirin     Type of Anesthesia:   CHOICE   Additional requests/questions:   Elpidio Anis   07/11/2021, 5:08 PM

## 2021-07-14 NOTE — Telephone Encounter (Signed)
   Name: Kelly Castaneda  DOB: [birth year]  MRN: [id number]  Primary Cardiologist: Lance Muss, MD  Chart reviewed as part of pre-operative protocol coverage. Because of [facility] past medical history and time since last visit, she will require a follow-up visit in order to better assess preoperative cardiovascular risk.  Pre-op covering staff: - Please schedule appointment and call patient to inform them. If patient already had an upcoming appointment within acceptable timeframe, please add "pre-op clearance" to the appointment notes so provider is aware. - Please contact requesting surgeon's office via preferred method (i.e, phone, fax) to inform them of need for appointment prior to surgery.  If applicable, this message will also be routed to pharmacy pool and/or primary cardiologist for input on holding anticoagulant/antiplatelet agent as requested below so that this information is available to the clearing provider at time of patient's appointment.   Surgery is next April, please time the visit with Dr. Eldridge Dace to around Feb 2023 (if no opening, mid March 2023 at the latest) to allow adequate time between the visit and surgery.  [place], [place]  07/14/2021, 9:11 AM

## 2021-07-14 NOTE — Telephone Encounter (Signed)
Left detailed message for the pt to call and schedule an appt for pre op clearance. Knee surgery 11/10/21, appt should be sometime 09/2021 or early March 2023 at the latest per pre op provider. Left message to call asap as the schedules are filling up quickly.

## 2021-07-15 NOTE — Telephone Encounter (Signed)
Pt has appt with Jacolyn Reedy, [facility] 09/03/21 for pre op clearance. Will send FYI to requesting office pt has appt 09/03/21

## 2021-07-25 DIAGNOSIS — G4733 Obstructive sleep apnea (adult) (pediatric): Secondary | ICD-10-CM | POA: Diagnosis not present

## 2021-08-04 DIAGNOSIS — G4733 Obstructive sleep apnea (adult) (pediatric): Secondary | ICD-10-CM | POA: Diagnosis not present

## 2021-08-08 DIAGNOSIS — M1711 Unilateral primary osteoarthritis, right knee: Secondary | ICD-10-CM | POA: Diagnosis not present

## 2021-08-13 ENCOUNTER — Ambulatory Visit (INDEPENDENT_AMBULATORY_CARE_PROVIDER_SITE_OTHER): Payer: Medicare HMO

## 2021-08-13 DIAGNOSIS — I441 Atrioventricular block, second degree: Secondary | ICD-10-CM

## 2021-08-13 LAB — CUP PACEART REMOTE DEVICE CHECK
Battery Remaining Longevity: 48 mo
Battery Remaining Percentage: 43 %
Battery Voltage: 2.98 V
Brady Statistic AP VP Percent: 60 %
Brady Statistic AP VS Percent: 1 %
Brady Statistic AS VP Percent: 40 %
Brady Statistic AS VS Percent: 1 %
Brady Statistic RA Percent Paced: 59 %
Brady Statistic RV Percent Paced: 99 %
Date Time Interrogation Session: 20230111024153
Implantable Lead Implant Date: 20190121
Implantable Lead Implant Date: 20190121
Implantable Lead Location: 753859
Implantable Lead Location: 753860
Implantable Pulse Generator Implant Date: 20170928
Lead Channel Impedance Value: 440 Ohm
Lead Channel Impedance Value: 460 Ohm
Lead Channel Pacing Threshold Amplitude: 0.375 V
Lead Channel Pacing Threshold Amplitude: 1 V
Lead Channel Pacing Threshold Pulse Width: 0.5 ms
Lead Channel Pacing Threshold Pulse Width: 0.5 ms
Lead Channel Sensing Intrinsic Amplitude: 1.7 mV
Lead Channel Sensing Intrinsic Amplitude: 12 mV
Lead Channel Setting Pacing Amplitude: 1.25 V
Lead Channel Setting Pacing Amplitude: 2 V
Lead Channel Setting Pacing Pulse Width: 0.5 ms
Lead Channel Setting Sensing Sensitivity: 4 mV
Pulse Gen Model: 2272
Pulse Gen Serial Number: 7950948

## 2021-08-20 NOTE — Progress Notes (Signed)
Cardiology Office Note    Date:  09/03/2021   ID:  [address]Kelly Castaneda, [place] [birth year], MRN [id number]   PCP:  Marden NobleGates, Robert, MD   [facility]  Cardiologist:  Lance MussJayadeep Varanasi, MD   Advanced Practice Provider:  No care team member to display Electrophysiologist:  Hillis RangeJames Allred, MD   [phone]   No chief complaint on file.   History of Present Illness:  Kelly Castaneda is a 71 y.o. female with history of hypertension, chronic diastolic CHF, pacemaker for Mobitz type II heart block followed by EP, obesity.  Patient last saw Dr. Eldridge DaceVaranasi 11/22/2019 and was doing well with weight loss and only using Lasix as needed.  Patient saw Dr. Johney FrameAllred 05/14/2021 and pacer functioning normally.  Last echo 2017 EF 50 to 55% with grade 1 DD.  Patient here for preop clearance for total knee arthroscopy by Dr. Lequita HaltAluisio 11/10/2021. Patient denies chest pain, palpitations, dyspnea, edema. BP up today but usually 120-130/70. She checks it 3-4 times/week. Says it's never this high. No regular exercise.     Past Medical History:  Diagnosis Date   Arthritis    "wrists, right knee" (11/06/2016)   Bell's palsy    Chronic diastolic CHF (congestive heart failure) (HCC)    a. 11/2015 Echo: EF 50-55%, Gr1 DD, mildly dil LA.   Complete heart block (HCC)    mobitz II second degree AV   Essential hypertension    Family history of adverse reaction to anesthesia    "had a little trouble waking my brother up; dx'd sleep apnea"   GERD (gastroesophageal reflux disease)    H/O hiatal hernia    History of kidney stones    Hypothyroidism    "not on RX" (11/06/2016)   Migraine    "none in years" (11/06/2016)   OSA on CPAP    "I wear nose pillows"   Presence of permanent cardiac pacemaker    Shingles    Syncope    a. 11/2015 - subsequent event monitoring revealed CHF in setting of beta blocker therapy - resolved with discontinuation of coreg.    Past Surgical History:  Procedure Laterality  Date   BACK SURGERY     BREAST BIOPSY Right 1990s   biopsy with titanium clip placement   CARDIAC CATHETERIZATION     "years ago"   CARDIAC CATHETERIZATION N/A 01/03/2016   Procedure: Temporary Pacemaker;  Surgeon: Lennette Biharihomas A Kelly, MD;  [facility];  Service: Cardiovascular;  Laterality: N/A;   DILATION AND CURETTAGE OF UTERUS     EP IMPLANTABLE DEVICE N/A 04/30/2016   SJM Assurity MRI PPM implanted for transient complete heart block by Dr Cora DanielsAllred   INSERT / REPLACE / REMOVE PACEMAKER     JOINT REPLACEMENT     LAPAROSCOPIC CHOLECYSTECTOMY  ~ 1990   LEAD REVISION  11/06/2016   pacemaker   LEAD REVISION/REPAIR N/A 11/06/2016   Procedure: Lead Revision/Repair;  Surgeon: Hillis RangeJames Allred, MD;  [facility];  Service: Cardiovascular;  Laterality: N/A;   LEAD REVISION/REPAIR N/A 08/23/2017   Procedure: LEAD REVISION/REPAIR;  Surgeon: Marinus Mawaylor, Gregg W, MD;  [facility];  Service: Cardiovascular;  Laterality: N/A;   LITHOTRIPSY  1990s   "I've had a couple of those" (11/06/2016)   LUMBAR DISC SURGERY  ~ 2007   PARATHYROIDECTOMY Right 1990s   TOTAL KNEE ARTHROPLASTY Left 09/25/2013   Procedure: LEFT TOTAL KNEE ARTHROPLASTY;  Surgeon: Loanne DrillingFrank V Aluisio, MD;  Location:  [facility];  Service: Orthopedics;  Laterality: Left;   TUBAL LIGATION     VAGINAL HYSTERECTOMY  1996    Current Medications: Current Meds  Medication Sig   acetaminophen (TYLENOL) 500 MG tablet Take 500 mg by mouth every 6 (six) hours as needed for mild pain.   amLODipine (NORVASC) 5 MG tablet Take 1 tablet (5 mg total) by mouth daily.   aspirin EC 81 MG tablet Take 81 mg by mouth daily.   carvedilol (COREG) 3.125 MG tablet Take 1 tablet (3.125 mg total) by mouth 2 (two) times daily with a meal.   Cholecalciferol (VITAMIN D) 2000 UNITS tablet Take 2,000 Units by mouth daily.   Cyanocobalamin (VITAMIN B 12 PO) Take 100 mcg by mouth daily.   furosemide (LASIX) 20 MG tablet Take 1 tablet (20 mg  total) by mouth daily.   losartan (COZAAR) 100 MG tablet Take 100 mg by mouth every morning.   naproxen sodium (ANAPROX) 220 MG tablet Take 440 mg by mouth daily as needed (arthritis).   omeprazole (PRILOSEC) 20 MG capsule Take 20 mg by mouth daily. For heartburn     Allergies:   Codeine, Fentanyl, Morphine, and Morphine and related   Social History   Socioeconomic History   Marital status: Divorced    Spouse name: Not on file   Number of children: Not on file   Years of education: Not on file   Highest education level: Not on file  Occupational History   Not on file  Tobacco Use   Smoking status: Never   Smokeless tobacco: Never  Substance and Sexual Activity   Alcohol use: No   Drug use: No   Sexual activity: Never  Other Topics Concern   Not on file  Social History Narrative   Not on file   Social Determinants of Health   Financial Resource Strain: Not on file  Food Insecurity: Not on file  Transportation Needs: Not on file  Physical Activity: Not on file  Stress: Not on file  Social Connections: Not on file     Family History:  The patient's  family history includes CAD in her father; Diabetes in her mother; Heart attack in her father; Hypertension in her father and mother.   ROS:   Please see the history of present illness.    ROS All other systems reviewed and are negative.   PHYSICAL EXAM:   VS:  BP (!) 148/90    Pulse 86    Ht 5\' 3"  (1.6 m)    Wt 229 lb (103.9 kg)    SpO2 92%    BMI 40.57 kg/m   Physical Exam  GEN: Obese, in no acute distress  Neck: no JVD, carotid bruits, or masses Cardiac:RRR; 2/6 systolic murmur LSB  Respiratory:  clear to auscultation bilaterally, normal work of breathing GI: soft, nontender, nondistended, + BS Ext: without cyanosis, clubbing, or edema, Good distal pulses bilaterally Neuro:  Alert and Oriented x 3 Psych: euthymic mood, full affect  Wt Readings from Last 3 Encounters:  09/03/21 229 lb (103.9 kg)  05/14/21 223  lb 6.4 oz (101.3 kg)  11/22/19 218 lb 12.8 oz (99.2 kg)      Studies/Labs Reviewed:   EKG:  EKG is   ordered today.  The ekg ordered today demonstrates V paced.  Recent Labs: No results found for requested labs within last 8760 hours.   Lipid Panel No results found for: CHOL, TRIG, HDL, CHOLHDL, VLDL, LDLCALC, LDLDIRECT  Additional studies/  records that were reviewed today include:  Echo 2017  Study Conclusions   - Left ventricle: The cavity size was mildly dilated. Wall    thickness was normal. Systolic function was normal. The estimated    ejection fraction was in the range of 50% to 55%. Wall motion was    normal; there were no regional wall motion abnormalities. Doppler    parameters are consistent with abnormal left ventricular    relaxation (grade 1 diastolic dysfunction).  - Left atrium: The atrium was mildly dilated.   Risk Assessment/Calculations:         ASSESSMENT:    1. Preoperative clearance   2. Pacemaker   3. Essential hypertension   4. Chronic diastolic heart failure (HCC)      PLAN:  In order of problems listed above:  Preop clearance for total knee arthoplasty Dr. Despina Hick 11/10/21 patient with history of pacemaker, chronic diastolic CHF, hypertension.  Patient's blood pressure is elevated today but she checks it regularly at home and it stable.  She has no cardiac symptoms.  Pacer check earlier this month was normal.  She does not need any further cardiac work-up before knee surgery. According to the Revised Cardiac Risk Index (RCRI), her Perioperative Risk of Major Cardiac Event is (%): 0.9  Her Functional Capacity in METs is: 6.05 according to the [employer] Activity Status Index (DASI).    Status post pacemaker for Mobitz type II heart block 2018 followed by EP-pacer check 08/13/21 normal.  Hypertension blood pressure elevated today but normal at home.  She will keep track of it and if it remains elevated we can increase her Coreg  Chronic  diastolic CHF LVEF 50 to 55% on echo 2017 grade 1 DD no evidence of heart failure on exam   Shared Decision Making/Informed Consent        Medication Adjustments/Labs and Tests Ordered: Current medicines are reviewed at length with the patient today.  Concerns regarding medicines are outlined above.  Medication changes, Labs and Tests ordered today are listed in the Patient Instructions below. Patient Instructions  Medication Instructions:  Your physician recommends that you continue on your current medications as directed. Please refer to the Current Medication list given to you today.  *If you need a refill on your cardiac medications before your next appointment, please call your pharmacy*   Lab Work: TODAY:  CMET, CBC, TSH, & LIPID  If you have labs (blood work) drawn today and your tests are completely normal, you will receive your results only by: MyChart Message (if you have MyChart) OR A paper copy in the mail If you have any lab test that is abnormal or we need to change your treatment, we will call you to review the results.   Testing/Procedures: None ordered   Follow-Up: At [facility], you and your health needs are our priority.  As part of our continuing mission to provide you with exceptional heart care, we have created designated Provider Care Teams.  These Care Teams include your primary Cardiologist (physician) and Advanced Practice Providers (APPs -  Physician Assistants and Nurse Practitioners) who all work together to provide you with the care you need, when you need it.  We recommend signing up for the patient portal called "MyChart".  Sign up information is provided on this After Visit Summary.  MyChart is used to connect with patients for Virtual Visits (Telemedicine).  Patients are able to view lab/test results, encounter notes, upcoming appointments, etc.  Non-urgent messages can be  sent to your provider as well.   To learn more about what you can do with  MyChart, go to [email or web address].    Your next appointment:   12 month(s)  The format for your next appointment:   In Person  Provider:   Lance Muss, MD  or Jacolyn Reedy, PA-C       If primary card or EP is not listed click here to update      Other Instructions  Keep a track of your blood pressure.  Let us know if it's consistently running higher than 130/80. You can send via mychart or call us at [phone].   Kelly Clan, PA-C  09/03/2021 9:09 AM    [facility] [address] [place], [place], [place]  [address] Phone: [phone]; Fax: [phone]

## 2021-08-22 NOTE — Progress Notes (Signed)
Remote pacemaker transmission.   

## 2021-08-25 DIAGNOSIS — G4733 Obstructive sleep apnea (adult) (pediatric): Secondary | ICD-10-CM | POA: Diagnosis not present

## 2021-09-03 ENCOUNTER — Encounter: Payer: Self-pay | Admitting: Physician Assistant

## 2021-09-03 ENCOUNTER — Ambulatory Visit: Payer: Medicare HMO | Admitting: Physician Assistant

## 2021-09-03 ENCOUNTER — Other Ambulatory Visit: Payer: Self-pay

## 2021-09-03 VITALS — BP 148/90 | HR 86 | Ht 63.0 in | Wt 229.0 lb

## 2021-09-03 DIAGNOSIS — I5032 Chronic diastolic (congestive) heart failure: Secondary | ICD-10-CM | POA: Diagnosis not present

## 2021-09-03 DIAGNOSIS — Z01818 Encounter for other preprocedural examination: Secondary | ICD-10-CM

## 2021-09-03 DIAGNOSIS — Z950 Presence of cardiac pacemaker: Secondary | ICD-10-CM

## 2021-09-03 DIAGNOSIS — I10 Essential (primary) hypertension: Secondary | ICD-10-CM | POA: Diagnosis not present

## 2021-09-03 LAB — TSH: TSH: 1.02 u[IU]/mL (ref 0.450–4.500)

## 2021-09-03 LAB — LIPID PANEL
Chol/HDL Ratio: 2.2 ratio (ref 0.0–4.4)
Cholesterol, Total: 199 mg/dL (ref 100–199)
HDL: 91 mg/dL (ref >39)
LDL Chol Calc (NIH): 97 mg/dL (ref 0–99)
Triglycerides: 59 mg/dL (ref 0–149)
VLDL Cholesterol Cal: 11 mg/dL (ref 5–40)

## 2021-09-03 LAB — COMPREHENSIVE METABOLIC PANEL
ALT: 10 IU/L (ref 0–32)
AST: 13 IU/L (ref 0–40)
Albumin/Globulin Ratio: 1.5 (ref 1.2–2.2)
Albumin: 4.3 g/dL (ref 3.7–4.7)
Alkaline Phosphatase: 56 IU/L (ref 44–121)
BUN/Creatinine Ratio: 20 (ref 12–28)
BUN: 17 mg/dL (ref 8–27)
Bilirubin Total: 0.3 mg/dL (ref 0.0–1.2)
CO2: 26 mmol/L (ref 20–29)
Calcium: 10.2 mg/dL (ref 8.7–10.3)
Chloride: 104 mmol/L (ref 96–106)
Creatinine, Ser: 0.86 mg/dL (ref 0.57–1.00)
Globulin, Total: 2.8 g/dL (ref 1.5–4.5)
Glucose: 108 mg/dL — ABNORMAL HIGH (ref 70–99)
Potassium: 4.7 mmol/L (ref 3.5–5.2)
Sodium: 141 mmol/L (ref 134–144)
Total Protein: 7.1 g/dL (ref 6.0–8.5)
eGFR: 72 mL/min/{1.73_m2} (ref >59)

## 2021-09-03 LAB — CBC
Hematocrit: 38.8 % (ref 34.0–46.6)
Hemoglobin: 13.1 g/dL (ref 11.1–15.9)
MCH: 31.6 pg (ref 26.6–33.0)
MCHC: 33.8 g/dL (ref 31.5–35.7)
MCV: 94 fL (ref 79–97)
Platelets: 218 10*3/uL (ref 150–450)
RBC: 4.15 x10E6/uL (ref 3.77–5.28)
RDW: 12.1 % (ref 11.7–15.4)
WBC: 5.3 10*3/uL (ref 3.4–10.8)

## 2021-09-03 NOTE — Patient Instructions (Addendum)
Medication Instructions:  Your physician recommends that you continue on your current medications as directed. Please refer to the Current Medication list given to you today.  *If you need a refill on your cardiac medications before your next appointment, please call your pharmacy*   Lab Work: TODAY:  CMET, CBC, TSH, & LIPID  If you have labs (blood work) drawn today and your tests are completely normal, you will receive your results only by: [place] (if you have MyChart) OR A paper copy in the mail If you have any lab test that is abnormal or we need to change your treatment, we will call you to review the results.   Testing/Procedures: None ordered   Follow-Up: At [facility], you and your health needs are our priority.  As part of our continuing mission to provide you with exceptional heart care, we have created designated Provider Care Teams.  These Care Teams include your primary Cardiologist (physician) and Advanced Practice Providers (APPs -  Physician Assistants and Nurse Practitioners) who all work together to provide you with the care you need, when you need it.  We recommend signing up for the patient portal called "MyChart".  Sign up information is provided on this After Visit Summary.  MyChart is used to connect with patients for Virtual Visits (Telemedicine).  Patients are able to view lab/test results, encounter notes, upcoming appointments, etc.  Non-urgent messages can be sent to your provider as well.   To learn more about what you can do with MyChart, go to [email or web address].    Your next appointment:   12 month(s)  The format for your next appointment:   In Person  Provider:   Larae Grooms, MD  or Ermalinda Barrios, PA-C       If primary card or EP is not listed click here to update      Other Instructions  Keep a track of your blood pressure.  Let us know if it's consistently running higher than 130/80. You can send via mychart or call us  at [phone].

## 2021-09-25 DIAGNOSIS — G4733 Obstructive sleep apnea (adult) (pediatric): Secondary | ICD-10-CM | POA: Diagnosis not present

## 2021-10-14 DIAGNOSIS — Z950 Presence of cardiac pacemaker: Secondary | ICD-10-CM | POA: Diagnosis not present

## 2021-10-14 DIAGNOSIS — I420 Dilated cardiomyopathy: Secondary | ICD-10-CM | POA: Diagnosis not present

## 2021-10-14 DIAGNOSIS — M179 Osteoarthritis of knee, unspecified: Secondary | ICD-10-CM | POA: Diagnosis not present

## 2021-10-15 NOTE — H&P (Signed)
TOTAL KNEE ADMISSION H&P ? ?Patient is being admitted for right total knee arthroplasty. ? ?Subjective: ? ?Chief Complaint: Right knee pain. ? ?HPI: Kelly Castaneda, 71 y.o. female has a history of pain and functional disability in the right knee due to arthritis and has failed non-surgical conservative treatments for greater than 12 weeks to include NSAID's and/or analgesics, flexibility and strengthening excercises, and activity modification. Onset of symptoms was gradual, starting  several  years ago with gradually worsening course since that time. The patient noted no past surgery on the right knee.  Patient currently rates pain in the right knee at 6 out of 10 with activity. Patient has worsening of pain with activity and weight bearing, pain that interferes with activities of daily living, pain with passive range of motion, and crepitus. Patient has evidence of periarticular osteophytes and joint space narrowing by imaging studies. There is no active infection. ? ?Patient Active Problem List  ? Diagnosis Date Noted  ? Trigger thumb of left hand 04/30/2018  ? History of total left knee replacement 11/22/2017  ? Osteoarthritis of right knee 11/22/2017  ? Complication associated with cardiac pacemaker lead 08/23/2017  ? Pacemaker 08/20/2017  ? Second degree Mobitz II AV block 11/06/2016  ? Symptomatic bradycardia 04/30/2016  ? Mobitz type 2 second degree atrioventricular block 04/30/2016  ? Complete heart block (HCC) 01/03/2016  ? Syncope and collapse 11/28/2015  ? Hypothyroidism 11/28/2015  ? Congestive heart failure (HCC) 10/02/2013  ? Essential hypertension, benign 10/02/2013  ? GERD (gastroesophageal reflux disease) 10/02/2013  ? Constipation 10/02/2013  ? Postoperative anemia due to acute blood loss 09/26/2013  ? OA (osteoarthritis) of knee 09/25/2013  ? ? ?Past Medical History:  ?Diagnosis Date  ? Arthritis   ? "wrists, right knee" (11/06/2016)  ? Bell's palsy   ? Chronic diastolic CHF (congestive heart  failure) (HCC)   ? a. 11/2015 Echo: EF 50-55%, Gr1 DD, mildly dil LA.  ? Complete heart block (HCC)   ? mobitz II second degree AV  ? Essential hypertension   ? Family history of adverse reaction to anesthesia   ? "had a little trouble waking my brother up; dx'd sleep apnea"  ? GERD (gastroesophageal reflux disease)   ? H/O hiatal hernia   ? History of kidney stones   ? Hypothyroidism   ? "not on RX" (11/06/2016)  ? Migraine   ? "none in years" (11/06/2016)  ? OSA on CPAP   ? "I wear nose pillows"  ? Presence of permanent cardiac pacemaker   ? Shingles   ? Syncope   ? a. 11/2015 - subsequent event monitoring revealed CHF in setting of beta blocker therapy - resolved with discontinuation of coreg.  ? ? ?Past Surgical History:  ?Procedure Laterality Date  ? BACK SURGERY    ? BREAST BIOPSY Right 1990s  ? biopsy with titanium clip placement  ? CARDIAC CATHETERIZATION    ? "years ago"  ? CARDIAC CATHETERIZATION N/A 01/03/2016  ? Procedure: Temporary Pacemaker;  Surgeon: Lennette Biharihomas A Kelly, MD;  Location: [facility];  Service: Cardiovascular;  Laterality: N/A;  ? DILATION AND CURETTAGE OF UTERUS    ? EP IMPLANTABLE DEVICE N/A 04/30/2016  ? SJM Assurity MRI PPM implanted for transient complete heart block by Dr Johney FrameAllred  ? INSERT / REPLACE / REMOVE PACEMAKER    ? JOINT REPLACEMENT    ? LAPAROSCOPIC CHOLECYSTECTOMY  ~ 1990  ? LEAD REVISION  11/06/2016  ? pacemaker  ? LEAD REVISION/REPAIR N/A  11/06/2016  ? Procedure: Lead Revision/Repair;  Surgeon: Hillis Range, MD;  Location: [facility];  Service: Cardiovascular;  Laterality: N/A;  ? LEAD REVISION/REPAIR N/A 08/23/2017  ? Procedure: LEAD REVISION/REPAIR;  Surgeon: Marinus Maw, MD;  Location: [facility] INVASIVE CV LAB;  Service: Cardiovascular;  Laterality: N/A;  ? LITHOTRIPSY  1990s  ? "I've had a couple of those" (11/06/2016)  ? LUMBAR DISC SURGERY  ~ 2007  ? PARATHYROIDECTOMY Right 1990s  ? TOTAL KNEE ARTHROPLASTY Left 09/25/2013  ? Procedure: LEFT TOTAL KNEE ARTHROPLASTY;   Surgeon: [name], MD;  Location: [facility];  Service: Orthopedics;  Laterality: Left;  ? TUBAL LIGATION    ? VAGINAL HYSTERECTOMY  1996  ? ? ?Prior to Admission medications   ?Medication Sig Start Date End Date Taking? Authorizing Provider  ?acetaminophen (TYLENOL) 500 MG tablet Take 500 mg by mouth every 6 (six) hours as needed for mild pain.    [provider]  ?amLODipine (NORVASC) 5 MG tablet Take 1 tablet (5 mg total) by mouth daily. 01/05/16   [name], NP  ?aspirin EC 81 MG tablet Take 81 mg by mouth daily.    [provider]  ?carvedilol (COREG) 3.125 MG tablet Take 1 tablet (3.125 mg total) by mouth 2 (two) times daily with a meal. 08/07/16   Allred, Fayrene Fearing, MD  ?Cholecalciferol (VITAMIN D) 2000 UNITS tablet Take 2,000 Units by mouth daily.    [provider]  ?Cyanocobalamin (VITAMIN B 12 PO) Take 100 mcg by mouth daily.    [provider]  ?furosemide (LASIX) 20 MG tablet Take 1 tablet (20 mg total) by mouth daily. 01/05/16   [name], NP  ?losartan (COZAAR) 100 MG tablet Take 100 mg by mouth every morning. 09/06/13   [name], MD  ?naproxen sodium (ANAPROX) 220 MG tablet Take 440 mg by mouth daily as needed (arthritis).    [provider]  ?omeprazole (PRILOSEC) 20 MG capsule Take 20 mg by mouth daily. For heartburn    [provider]  ? ? ?Allergies  ?Allergen Reactions  ? Codeine Nausea And Vomiting and Nausea Only  ? Fentanyl Nausea And Vomiting and Nausea Only  ? Morphine Nausea Only  ? Morphine And Related Nausea And Vomiting  ? ? ?Social History  ? ?Socioeconomic History  ? Marital status: Divorced  ?  Spouse name: Not on file  ? Number of children: Not on file  ? Years of education: Not on file  ? Highest education level: Not on file  ?Occupational History  ? Not on file  ?Tobacco Use  ? Smoking status: Never  ? Smokeless tobacco: Never  ?Substance and Sexual Activity  ? Alcohol use: No  ? Drug use: No   ? Sexual activity: Never  ?Other Topics Concern  ? Not on file  ?Social History Narrative  ? Not on file  ? ?Social Determinants of Health  ? ?Financial Resource Strain: Not on file  ?Food Insecurity: Not on file  ?Transportation Needs: Not on file  ?Physical Activity: Not on file  ?Stress: Not on file  ?Social Connections: Not on file  ?Intimate Partner Violence: Not on file  ? ? ?Tobacco Use: Low Risk   ? Smoking Tobacco Use: Never  ? Smokeless Tobacco Use: Never  ? Passive Exposure: Not on file  ? ?Social History  ? ?Substance and Sexual Activity  ?Alcohol Use No  ? ? ?Family History  ?Problem Relation Age of Onset  ?  Diabetes Mother   ? Hypertension Mother   ? CAD Father   ? Heart attack Father   ? Hypertension Father   ? Stroke Neg Hx   ? Breast cancer Neg Hx   ? ? ?ROS: ?Constitutional: no fever, no chills, no night sweats, no significant weight loss ?Cardiovascular: no chest pain, no palpitations ?Respiratory: no cough, no shortness of breath, No COPD ?Gastrointestinal: no vomiting, no nausea ?Musculoskeletal: no swelling in Joints, Joint Pain ?Neurologic: no numbness, no tingling, no difficulty with balance ? ? ?Objective: ? ?Physical Exam: ? Well nourished and well developed.  ?General: Alert and oriented x3, cooperative and pleasant, no acute distress.  ?Head: normocephalic, atraumatic, neck supple.  ?Eyes: EOMI.  ?Respiratory: breath sounds clear in all fields, no wheezing, rales, or rhonchi. ?Cardiovascular: Regular rate and rhythm, no murmurs, gallops or rubs.  ?Abdomen: non-tender to palpation and soft, normoactive bowel sounds. ?Musculoskeletal: ? ?Antalgic gait without the use of assistive devices. ? ? Right Hip Exam: ? ROM: Normal without discomfort.. ? ? Right Knee Exam: ? No effusion present. No swelling present. ? Range of motion: 0 to 130 degrees. ? Positive crepitus on range of motion of the knee. ? Positive medial joint line tenderness. ? No lateral joint line tenderness. ? The knee is  stable. ? ? ?Calves soft and nontender. Motor function intact in LE. Strength 5/5 LE bilaterally. ?Neuro: Distal pulses 2+. Sensation to light touch intact in LE. ? ? ? ?Vital signs in last 24 hours: ?BP: ()/()  ?

## 2021-10-22 DIAGNOSIS — Z950 Presence of cardiac pacemaker: Secondary | ICD-10-CM | POA: Diagnosis not present

## 2021-10-22 DIAGNOSIS — I10 Essential (primary) hypertension: Secondary | ICD-10-CM | POA: Diagnosis not present

## 2021-10-22 DIAGNOSIS — I420 Dilated cardiomyopathy: Secondary | ICD-10-CM | POA: Diagnosis not present

## 2021-10-22 DIAGNOSIS — M179 Osteoarthritis of knee, unspecified: Secondary | ICD-10-CM | POA: Diagnosis not present

## 2021-10-23 DIAGNOSIS — G4733 Obstructive sleep apnea (adult) (pediatric): Secondary | ICD-10-CM | POA: Diagnosis not present

## 2021-10-24 ENCOUNTER — Encounter: Payer: Self-pay | Admitting: Internal Medicine

## 2021-10-24 NOTE — Patient Instructions (Signed)
DUE TO COVID-19 ONLY ONE VISITOR  (aged 73 and older)  IS ALLOWED TO COME WITH YOU AND STAY IN THE WAITING ROOM ONLY DURING PRE OP AND PROCEDURE.   ?**NO VISITORS ARE ALLOWED IN THE SHORT STAY AREA OR RECOVERY ROOM!!** ? ?IF YOU WILL BE ADMITTED INTO THE HOSPITAL YOU ARE ALLOWED ONLY TWO SUPPORT PEOPLE DURING VISITATION HOURS ONLY (7 AM -8PM)   ?The support person(s) must pass our screening, gel in and out, and wear a mask at all times, including in the patient?s room. ?Patients must also wear a mask when staff or their support person are in the room. ?Visitors GUEST BADGE MUST BE WORN VISIBLY  ?One adult visitor may remain with you overnight and MUST be in the room by 8 P.M. ?  ? ? Your procedure is scheduled on: 11/10/21 ? ? Report to [facility] Main Entrance ? ?  Report to admitting at : 7:45 AM ? ? Call this number if you have problems the morning of surgery [phone] ? ? Do not eat food :After Midnight. ? ? After Midnight you may have the following liquids until : 7:30 AM  DAY OF SURGERY ? ?Water ?Black Coffee (sugar ok, NO MILK/CREAM OR CREAMERS)  ?Tea (sugar ok, NO MILK/CREAM OR CREAMERS) regular and decaf                             ?Plain Jell-O (NO RED)                                           ?Fruit ices (not with fruit pulp, NO RED)                                     ?Popsicles (NO RED)                                                                  ?Juice: apple, WHITE grape, WHITE cranberry ?Sports drinks like Gatorade (NO RED) ?Clear broth(vegetable,chicken,beef) ? ?             ?Drink  Ensure drink AT : 7:30 AM the day of surgery.  ?  ?The day of surgery:  ?Drink ONE (1) Pre-Surgery Clear Ensure or G2 at AM the morning of surgery. Drink in one sitting. Do not sip.  ?This drink was given to you during your hospital  ?pre-op appointment visit. ?Nothing else to drink after completing the  ?Pre-Surgery Clear Ensure or G2. ?  ?       If you have questions, please contact your surgeon?s  office.  ?  ?Oral Hygiene is also important to reduce your risk of infection.                                    ?Remember - BRUSH YOUR TEETH THE MORNING OF SURGERY WITH YOUR REGULAR TOOTHPASTE ? ? Do NOT smoke after Midnight ? ? Take these medicines the morning  of surgery with A SIP OF WATER: carvedilol,amlodipine,omeprazole. ? ?DO NOT TAKE ANY ORAL DIABETIC MEDICATIONS DAY OF YOUR SURGERY ? ?Bring CPAP mask and tubing day of surgery. ?                  ?           You may not have any metal on your body including hair pins, jewelry, and body piercing ? ?           Do not wear make-up, lotions, powders, perfumes/cologne, or deodorant ? ?Do not wear nail polish including gel and S&S, artificial/acrylic nails, or any other type of covering on natural nails including finger and toenails. If you have artificial nails, gel coating, etc. that needs to be removed by a nail salon please have this removed prior to surgery or surgery may need to be canceled/ delayed if the surgeon/ anesthesia feels like they are unable to be safely monitored.  ? ?Do not shave  48 hours prior to surgery.  ? ? Do not bring valuables to the hospital. [place] NOT ?            RESPONSIBLE   FOR VALUABLES. ? ? Contacts, dentures or bridgework may not be worn into surgery. ? ? Bring small overnight bag day of surgery. ?  ? Patients discharged on the day of surgery will not be allowed to drive home.  Someone NEEDS to stay with you for the first 24 hours after anesthesia. ? ? Special Instructions: Bring a copy of your healthcare power of attorney and living will documents         the day of surgery if you haven't scanned them before. ? ?            Please read over the following fact sheets you were given: IF [place] [phone] ? ?   [facility] - Preparing for Surgery ?Before surgery, you can play an important role.  Because skin is not sterile, your skin needs to be as free of germs as  possible.  You can reduce the number of germs on your skin by washing with CHG (chlorahexidine gluconate) soap before surgery.  CHG is an antiseptic cleaner which kills germs and bonds with the skin to continue killing germs even after washing. ?Please DO NOT use if you have an allergy to CHG or antibacterial soaps.  If your skin becomes reddened/irritated stop using the CHG and inform your nurse when you arrive at Short Stay. ?Do not shave (including legs and underarms) for at least 48 hours prior to the first CHG shower.  You may shave your face/neck. ?Please follow these instructions carefully: ? 1.  Shower with CHG Soap the night before surgery and the  morning of Surgery. ? 2.  If you choose to wash your hair, wash your hair first as usual with your  normal  shampoo. ? 3.  After you shampoo, rinse your hair and body thoroughly to remove the  shampoo.                           4.  Use CHG as you would any other liquid soap.  You can apply chg directly  to the skin and wash  ?                     Gently with a scrungie or  clean washcloth. ? 5.  Apply the CHG Soap to your body ONLY FROM THE NECK DOWN.   Do not use on face/ open      ?                     Wound or open sores. Avoid contact with eyes, ears mouth and genitals (private parts).  ?                     Production manager,  Genitals (private parts) with your normal soap. ?            6.  Wash thoroughly, paying special attention to the area where your surgery  will be performed. ? 7.  Thoroughly rinse your body with warm water from the neck down. ? 8.  DO NOT shower/wash with your normal soap after using and rinsing off  the CHG Soap. ?               9.  Pat yourself dry with a clean towel. ?           10.  Wear clean pajamas. ?           11.  Place clean sheets on your bed the night of your first shower and do not  sleep with pets. ?Day of Surgery : ?Do not apply any lotions/deodorants the morning of surgery.  Please wear clean clothes to the hospital/surgery  center. ? ?FAILURE TO FOLLOW THESE INSTRUCTIONS MAY RESULT IN THE CANCELLATION OF YOUR SURGERY ?PATIENT SIGNATURE_________________________________ ? ?NURSE SIGNATURE__________________________________ ? ?________________________________________________________________________  ? ?Incentive Spirometer ? ?An incentive spirometer is a tool that can help keep your lungs clear and active. This tool measures how well you are filling your lungs with each breath. Taking long deep breaths may help reverse or decrease the chance of developing breathing (pulmonary) problems (especially infection) following: ?A long period of time when you are unable to move or be active. ?BEFORE THE PROCEDURE  ?If the spirometer includes an indicator to show your best effort, your nurse or respiratory therapist will set it to a desired goal. ?If possible, sit up straight or lean slightly forward. Try not to slouch. ?Hold the incentive spirometer in an upright position. ?INSTRUCTIONS FOR USE  ?Sit on the edge of your bed if possible, or sit up as far as you can in bed or on a chair. ?Hold the incentive spirometer in an upright position. ?Breathe out normally. ?Place the mouthpiece in your mouth and seal your lips tightly around it. ?Breathe in slowly and as deeply as possible, raising the piston or the ball toward the top of the column. ?Hold your breath for 3-5 seconds or for as long as possible. Allow the piston or ball to fall to the bottom of the column. ?Remove the mouthpiece from your mouth and breathe out normally. ?Rest for a few seconds and repeat Steps 1 through 7 at least 10 times every 1-2 hours when you are awake. Take your time and take a few normal breaths between deep breaths. ?The spirometer may include an indicator to show your best effort. Use the indicator as a goal to work toward during each repetition. ?After each set of 10 deep breaths, practice coughing to be sure your lungs are clear. If you have an incision (the cut  made at the time of surgery), support your incision when coughing by placing a pillow or rolled up towels firmly against it. ?Once you  are able to get out of bed, walk around indoors and cough well. You may sto

## 2021-10-24 NOTE — Progress Notes (Signed)
PERIOPERATIVE PRESCRIPTION FOR IMPLANTED CARDIAC DEVICE PROGRAMMING ? ?Patient Information: ?Name:  [facility]  ?DOB:  12-27-1950  ?MRN:  [id number]  ?  ?Planned Procedure:   Right Total Knee.  ?Surgeon:  Dr. Homero Fellers Aluisio  ?Date of Procedure:  11/10/21  ?Cautery will be used.  ?Position during surgery:    ? ?Please send documentation back to:  ?[facility] (Fax # [phone])   ?Device Information: ? ?Clinic EP Physician:  Hillis Range, MD  ? ?Device Type:  Pacemaker ?Teacher, early years/pre #:  [employer]: [phone] ?Pacemaker Dependent?:  Yes.   ?Date of Last Device Check:  08/13/2021 Normal Device Function?:  Yes.   ? ?Electrophysiologist's Recommendations: ? ?Have magnet available. ?Provide continuous ECG monitoring when magnet is used or reprogramming is to be performed.  ?Procedure may interfere with device function.  Magnet should be placed over device during procedure. ? ?Per Device Clinic Standing Orders, ?Dorathy Daft, RN  ?3:29 PM 10/24/2021  ?

## 2021-10-28 ENCOUNTER — Other Ambulatory Visit: Payer: Self-pay

## 2021-10-28 ENCOUNTER — Encounter (HOSPITAL_COMMUNITY)
Admission: RE | Admit: 2021-10-28 | Discharge: 2021-10-28 | Disposition: A | Discharge status: Home / Self Care | Payer: Medicare HMO | Source: Ambulatory Visit | Attending: Orthopedic Surgery | Admitting: Orthopedic Surgery

## 2021-10-28 ENCOUNTER — Encounter (HOSPITAL_COMMUNITY): Payer: Self-pay

## 2021-10-28 VITALS — BP 149/76 | HR 59 | Temp 97.6°F | Ht 63.0 in | Wt 223.0 lb

## 2021-10-28 DIAGNOSIS — K219 Gastro-esophageal reflux disease without esophagitis: Secondary | ICD-10-CM | POA: Diagnosis not present

## 2021-10-28 DIAGNOSIS — Z950 Presence of cardiac pacemaker: Secondary | ICD-10-CM | POA: Insufficient documentation

## 2021-10-28 DIAGNOSIS — G4733 Obstructive sleep apnea (adult) (pediatric): Secondary | ICD-10-CM | POA: Insufficient documentation

## 2021-10-28 DIAGNOSIS — Z9989 Dependence on other enabling machines and devices: Secondary | ICD-10-CM | POA: Insufficient documentation

## 2021-10-28 DIAGNOSIS — I5032 Chronic diastolic (congestive) heart failure: Secondary | ICD-10-CM | POA: Diagnosis not present

## 2021-10-28 DIAGNOSIS — Z01812 Encounter for preprocedural laboratory examination: Secondary | ICD-10-CM | POA: Insufficient documentation

## 2021-10-28 DIAGNOSIS — I110 Hypertensive heart disease with heart failure: Secondary | ICD-10-CM | POA: Diagnosis not present

## 2021-10-28 DIAGNOSIS — M1711 Unilateral primary osteoarthritis, right knee: Secondary | ICD-10-CM | POA: Diagnosis not present

## 2021-10-28 DIAGNOSIS — I441 Atrioventricular block, second degree: Secondary | ICD-10-CM | POA: Diagnosis not present

## 2021-10-28 DIAGNOSIS — Z01818 Encounter for other preprocedural examination: Secondary | ICD-10-CM

## 2021-10-28 LAB — COMPREHENSIVE METABOLIC PANEL
ALT: 11 U/L (ref 0–44)
AST: 14 U/L — ABNORMAL LOW (ref 15–41)
Albumin: 4.1 g/dL (ref 3.5–5.0)
Alkaline Phosphatase: 55 U/L (ref 38–126)
Anion gap: 6 (ref 5–15)
BUN: 36 mg/dL — ABNORMAL HIGH (ref 8–23)
CO2: 25 mmol/L (ref 22–32)
Calcium: 10.1 mg/dL (ref 8.9–10.3)
Chloride: 107 mmol/L (ref 98–111)
Creatinine, Ser: 1.26 mg/dL — ABNORMAL HIGH (ref 0.44–1.00)
GFR, Estimated: 45 mL/min — ABNORMAL LOW (ref >60)
Glucose, Bld: 121 mg/dL — ABNORMAL HIGH (ref 70–99)
Potassium: 4.3 mmol/L (ref 3.5–5.1)
Sodium: 138 mmol/L (ref 135–145)
Total Bilirubin: 0.4 mg/dL (ref 0.3–1.2)
Total Protein: 8 g/dL (ref 6.5–8.1)

## 2021-10-28 LAB — CBC
HCT: 42.9 % (ref 36.0–46.0)
Hemoglobin: 13.9 g/dL (ref 12.0–15.0)
MCH: 32 pg (ref 26.0–34.0)
MCHC: 32.4 g/dL (ref 30.0–36.0)
MCV: 98.6 fL (ref 80.0–100.0)
Platelets: 214 10*3/uL (ref 150–400)
RBC: 4.35 MIL/uL (ref 3.87–5.11)
RDW: 12.9 % (ref 11.5–15.5)
WBC: 5.9 10*3/uL (ref 4.0–10.5)
nRBC: 0 % (ref 0.0–0.2)

## 2021-10-28 LAB — SURGICAL PCR SCREEN
MRSA, PCR: NEGATIVE
Staphylococcus aureus: NEGATIVE

## 2021-10-28 NOTE — Progress Notes (Addendum)
For Short Stay: ?COVID SWAB appointment date: N/A ?Date of COVID positive in last 24 days:N/A ?COVID Vaccine: ?Bowel Prep reminder:N/A ? ? ?For Anesthesia: ?PCP - Dr. Hillis Range. Clearance: Dr. Marden Noble: 10/14/21: Chart ?Cardiologist - Dr. Lance Muss. Clearance: MichelleLenze: PAC: 09/03/21: EPIC ? ?Chest x-ray -  ?EKG - 09/03/21 ?Stress Test -  ?ECHO - 11/29/15 ?Cardiac Cath - 01/03/11 ?Pacemaker/ICD device last checked: 08/13/21 ?Pacemaker orders received: ?Device Rep notified: ? ?Spinal Cord Stimulator: ? ?Sleep Study -  ?CPAP -  ? ?Fasting Blood Sugar -  ?Checks Blood Sugar _____ times a day ?Date and result of last Hgb A1c- ? ?Blood Thinner Instructions: ?Aspirin Instructions: ?Last Dose: ? ?Activity level: Can go up a flight of stairs and activities of daily living without stopping and without chest pain and/or shortness of breath ?  Able to exercise without chest pain and/or shortness of breath ?  Unable to go up a flight of stairs without chest pain and/or shortness of breath ?   ? ?Anesthesia review: Hx: HTN,CHF,Heart block,OSA(CPAP) ? ?Patient denies shortness of breath, fever, cough and chest pain at PAT appointment ? ? ?Patient verbalized understanding of instructions that were given to them at the PAT appointment. Patient was also instructed that they will need to review over the PAT instructions again at home before surgery.  ?

## 2021-10-29 NOTE — Progress Notes (Signed)
Anesthesia Chart Review ? ? Case: [id number] Date/Time: 11/10/21 1015  ? Procedure: TOTAL KNEE ARTHROPLASTY (Right: Knee)  ? Anesthesia type: Choice  ? Pre-op diagnosis: right knee osteoarthritis  ? [facility]: [facility] ROOM 10 / [facility]  ? Surgeons: Ollen Gross, MD  ? ?  ? ? ?DISCUSSION:70 y.o. never smoker with h/o HTN, GERD, OSA on CPAP, CHF, pacemaker in place due to Mobitz type II heart block (device orders in progress note 10/24/2021), right knee OA scheduled for above procedure 11/10/2021 with Dr. Ollen Gross.  ? ?Pt last seen by cardiology 09/03/21. Per OV note, "Preop clearance for total knee arthoplasty Dr. Despina Castaneda 11/10/21 patient with history of pacemaker, chronic diastolic CHF, hypertension.  Patient's blood pressure is elevated today but she checks it regularly at home and it stable.  She has no cardiac symptoms.  Pacer check earlier this month was normal.  She does not need any further cardiac work-up before knee surgery. ?According to the Revised Cardiac Risk Index (RCRI), her Perioperative Risk of Major Cardiac Event is (%): 0.9 ?  ?Her Functional Capacity in METs is: 6.05 according to the Duke Activity Status Index (DASI)." ? ?Anticipate pt can proceed with planned procedure barring acute status change.   ?VS: BP (!) 149/76   Pulse (!) 59   Temp 36.4 ?C (Oral)   Ht 5\' 3"  (1.6 m)   Wt 101.2 kg   SpO2 99%   BMI 39.50 kg/m?  ? ?PROVIDERS: ? , MD is PCP  ? ?Cardiologist:  Marden Noble, MD   ? ?Electrophysiologist:  Lance Muss, MD  ?LABS: Labs reviewed: Acceptable for surgery. ?(all labs ordered are listed, but only abnormal results are displayed) ? ?Labs Reviewed  ?COMPREHENSIVE METABOLIC PANEL - Abnormal; Notable for the following components:  ?    Result Value  ? Glucose, Bld 121 (*)   ? BUN 36 (*)   ? Creatinine, Ser 1.26 (*)   ? AST 14 (*)   ? GFR, Estimated 45 (*)   ? All other components within normal limits  ?SURGICAL PCR SCREEN  ?CBC  ? ? ? ?IMAGES: ? ? ?EKG: ?09/03/21 ?Rate 66 bpm   ?V paced ? ?CV: ?Echo 11/29/2015 ?Study Conclusions  ? ?- Left ventricle: The cavity size was mildly dilated. Wall  ?  thickness was normal. Systolic function was normal. The estimated  ?  ejection fraction was in the range of 50% to 55%. Wall motion was  ?  normal; there were no regional wall motion abnormalities. Doppler  ?  parameters are consistent with abnormal left ventricular  ?  relaxation (grade 1 diastolic dysfunction).  ?- Left atrium: The atrium was mildly dilated.  ?Past Medical History:  ?Diagnosis Date  ? Arthritis   ? "wrists, right knee" (11/06/2016)  ? Bell's palsy   ? Chronic diastolic CHF (congestive heart failure) (HCC)   ? a. 11/2015 Echo: EF 50-55%, Gr1 DD, mildly dil LA.  ? Complete heart block (HCC)   ? mobitz II second degree AV  ? Essential hypertension   ? Family history of adverse reaction to anesthesia   ? "had a little trouble waking my brother up; dx'd sleep apnea"  ? GERD (gastroesophageal reflux disease)   ? H/O hiatal hernia   ? History of kidney stones   ? Hypothyroidism   ? "not on RX" (11/06/2016)  ? Migraine   ? "none in years" (11/06/2016)  ? OSA on CPAP   ? "I wear nose pillows"  ? Presence of  permanent cardiac pacemaker   ? Shingles   ? Syncope   ? a. 11/2015 - subsequent event monitoring revealed CHF in setting of beta blocker therapy - resolved with discontinuation of coreg.  ? ? ?Past Surgical History:  ?Procedure Laterality Date  ? BACK SURGERY    ? BREAST BIOPSY Right 1990s  ? biopsy with titanium clip placement  ? CARDIAC CATHETERIZATION    ? "years ago"  ? CARDIAC CATHETERIZATION N/A 01/03/2016  ? Procedure: Temporary Pacemaker;  Surgeon: Lennette Bihari, MD;  [facility];  Service: Cardiovascular;  Laterality: N/A;  ? DILATION AND CURETTAGE OF UTERUS    ? EP IMPLANTABLE DEVICE N/A 04/30/2016  ? SJM Assurity MRI PPM implanted for transient complete heart block by Dr Johney Frame  ? INSERT / REPLACE / REMOVE PACEMAKER    ? JOINT REPLACEMENT    ? LAPAROSCOPIC  CHOLECYSTECTOMY  ~ 1990  ? LEAD REVISION  11/06/2016  ? pacemaker  ? LEAD REVISION/REPAIR N/A 11/06/2016  ? Procedure: Lead Revision/Repair;  Surgeon: Hillis Range, MD;  [facility]: [facility];  Service: Cardiovascular;  Laterality: N/A;  ? LEAD REVISION/REPAIR N/A 08/23/2017  ? Procedure: LEAD REVISION/REPAIR;  Surgeon: Marinus Maw, MD;  Location: [facility] INVASIVE CV LAB;  Service: Cardiovascular;  Laterality: N/A;  ? LITHOTRIPSY  1990s  ? "I've had a couple of those" (11/06/2016)  ? LUMBAR DISC SURGERY  ~ 2007  ? PARATHYROIDECTOMY Right 1990s  ? PARATHYROIDECTOMY    ? TOTAL KNEE ARTHROPLASTY Left 09/25/2013  ? Procedure: LEFT TOTAL KNEE ARTHROPLASTY;  Surgeon: [name], MD;  Location: [facility];  Service: Orthopedics;  Laterality: Left;  ? TUBAL LIGATION    ? VAGINAL HYSTERECTOMY  1996  ? ? ?MEDICATIONS: ? acetaminophen (TYLENOL) 500 MG tablet  ? amLODipine (NORVASC) 5 MG tablet  ? aspirin EC 81 MG tablet  ? carvedilol (COREG) 3.125 MG tablet  ? Cholecalciferol (VITAMIN D) 2000 UNITS tablet  ? Cyanocobalamin (VITAMIN B 12 PO)  ? furosemide (LASIX) 20 MG tablet  ? losartan (COZAAR) 100 MG tablet  ? omeprazole (PRILOSEC) 20 MG capsule  ? triamterene-hydrochlorothiazide (MAXZIDE-25) 37.5-25 MG tablet  ? ?No current facility-administered medications for this encounter.  ? ? ? ?[name], PA-C ?[facility] ?[phone] ? ? ? ? ? ?

## 2021-10-29 NOTE — Anesthesia Preprocedure Evaluation (Addendum)
Anesthesia Evaluation  ?Patient identified by MRN, date of birth, ID band ?Patient awake ? ? ? ?Reviewed: ?Allergy & Precautions, NPO status , Patient's Chart, lab work & pertinent test results, reviewed documented beta blocker date and time  ? ?History of Anesthesia Complications ?(+) Family history of anesthesia reaction ? ?Airway ?Mallampati: II ? ?TM Distance: >3 FB ?Neck ROM: Full ? ? ? Dental ?no notable dental hx. ?(+) Dental Advisory Given ?  ?Pulmonary ?sleep apnea and Continuous Positive Airway Pressure Ventilation ,  ?  ?Pulmonary exam normal ?breath sounds clear to auscultation ? ? ? ? ? ? Cardiovascular ?hypertension, Pt. on medications and Pt. on home beta blockers ?+CHF  ?Normal cardiovascular exam+ dysrhythmias + pacemaker  ?Rhythm:Regular Rate:Normal ? ?Hx/o complete heart block, S/P PPM insertion ?  ?Neuro/Psych ? Headaches,  Neuromuscular disease negative psych ROS  ? GI/Hepatic ?hiatal hernia, GERD  Medicated and Controlled,  ?Endo/Other  ?Hypothyroidism Morbid obesity ? Renal/GU ?  ?negative genitourinary ?  ?Musculoskeletal ? ?(+) Arthritis , Osteoarthritis,  OA left knee  ? Abdominal ?(+) + obese,   ?Peds ? Hematology ? ?(+) Blood dyscrasia, anemia ,   ?Anesthesia Other Findings ? ? Reproductive/Obstetrics ? ?  ? ? ? ? ? ? ? ? ? ? ? ? ? ?  ?  ? ? ? ? ? ? ? ?Anesthesia Physical ?Anesthesia Plan ? ?ASA: 3 ? ?Anesthesia Plan: Spinal  ? ?Post-op Pain Management: Minimal or no pain anticipated  ? ?Induction: Intravenous ? ?PONV Risk Score and Plan: 3 and Treatment may vary due to age or medical condition and Propofol infusion ? ?Airway Management Planned: Natural Airway and Simple Face Mask ? ?Additional Equipment: None ? ?Intra-op Plan:  ? ?Post-operative Plan:  ? ?Informed Consent: I have reviewed the patients History and Physical, chart, labs and discussed the procedure including the risks, benefits and alternatives for the proposed anesthesia with the patient  or authorized representative who has indicated his/her understanding and acceptance.  ? ? ? ?Dental advisory given ? ?Plan Discussed with: CRNA and Anesthesiologist ? ?Anesthesia Plan Comments: (See PAT note 10/28/2021, [name], PA-C)  ? ? ? ? ? ?Anesthesia Quick Evaluation ? ?

## 2021-11-07 NOTE — Progress Notes (Signed)
Pt aware to arrive at [facility] admitting dept at 11 am on Monday 09/12/2021 for scheduled surgical procedure. Pt aware no food after midnight, clear liquids from midnight till 11:30 am consuming entire pre surgery drink by 11:30 am then nothing by mouth.  ?

## 2021-11-10 ENCOUNTER — Encounter (HOSPITAL_COMMUNITY): Payer: Self-pay | Admitting: Orthopedic Surgery

## 2021-11-10 ENCOUNTER — Ambulatory Visit (HOSPITAL_BASED_OUTPATIENT_CLINIC_OR_DEPARTMENT_OTHER): Payer: Medicare HMO | Admitting: Anesthesiology

## 2021-11-10 ENCOUNTER — Ambulatory Visit (HOSPITAL_COMMUNITY): Payer: Medicare HMO | Admitting: Physician Assistant

## 2021-11-10 ENCOUNTER — Encounter (HOSPITAL_COMMUNITY)
Admission: RE | Disposition: A | Discharge status: Home / Self Care | Payer: Self-pay | Source: Ambulatory Visit | Attending: Orthopedic Surgery

## 2021-11-10 ENCOUNTER — Observation Stay (HOSPITAL_COMMUNITY)
Admission: RE | Admit: 2021-11-10 | Discharge: 2021-11-11 | Disposition: A | Discharge status: Home / Self Care | Payer: Medicare HMO | Source: Ambulatory Visit | Attending: Orthopedic Surgery | Admitting: Orthopedic Surgery

## 2021-11-10 ENCOUNTER — Other Ambulatory Visit: Payer: Self-pay

## 2021-11-10 DIAGNOSIS — E039 Hypothyroidism, unspecified: Secondary | ICD-10-CM

## 2021-11-10 DIAGNOSIS — Z950 Presence of cardiac pacemaker: Secondary | ICD-10-CM | POA: Diagnosis not present

## 2021-11-10 DIAGNOSIS — I110 Hypertensive heart disease with heart failure: Secondary | ICD-10-CM

## 2021-11-10 DIAGNOSIS — I509 Heart failure, unspecified: Secondary | ICD-10-CM

## 2021-11-10 DIAGNOSIS — M1711 Unilateral primary osteoarthritis, right knee: Secondary | ICD-10-CM

## 2021-11-10 DIAGNOSIS — Z79899 Other long term (current) drug therapy: Secondary | ICD-10-CM | POA: Diagnosis not present

## 2021-11-10 DIAGNOSIS — Z7982 Long term (current) use of aspirin: Secondary | ICD-10-CM | POA: Insufficient documentation

## 2021-11-10 DIAGNOSIS — G8918 Other acute postprocedural pain: Secondary | ICD-10-CM | POA: Diagnosis not present

## 2021-11-10 DIAGNOSIS — Z96652 Presence of left artificial knee joint: Secondary | ICD-10-CM | POA: Insufficient documentation

## 2021-11-10 HISTORY — PX: TOTAL KNEE ARTHROPLASTY: SHX125

## 2021-11-10 SURGERY — ARTHROPLASTY, KNEE, TOTAL
Anesthesia: Spinal | Site: Knee | Laterality: Right

## 2021-11-10 MED ORDER — TRAMADOL HCL 50 MG PO TABS
50.0000 mg | ORAL_TABLET | Freq: Four times a day (QID) | ORAL | Status: DC | PRN
  Administered 2021-11-11: 50 mg via ORAL
  Filled 2021-11-10: qty 1

## 2021-11-10 MED ORDER — PHENYLEPHRINE HCL-NACL 20-0.9 MG/250ML-% IV SOLN
INTRAVENOUS | Status: AC
  Filled 2021-11-10: qty 250

## 2021-11-10 MED ORDER — ROPIVACAINE HCL 7.5 MG/ML IJ SOLN
INTRAMUSCULAR | Status: DC | PRN
  Administered 2021-11-10: 20 mL via PERINEURAL

## 2021-11-10 MED ORDER — CARVEDILOL 6.25 MG PO TABS
6.2500 mg | ORAL_TABLET | Freq: Two times a day (BID) | ORAL | Status: DC
  Administered 2021-11-10 – 2021-11-11 (×2): 6.25 mg via ORAL
  Filled 2021-11-10 (×2): qty 1

## 2021-11-10 MED ORDER — HYDROMORPHONE HCL 1 MG/ML IJ SOLN: 0.2500 mg | INTRAMUSCULAR | Status: DC | PRN

## 2021-11-10 MED ORDER — BUPIVACAINE LIPOSOME 1.3 % IJ SUSP: 20.0000 mL | Freq: Once | INTRAMUSCULAR | Status: DC

## 2021-11-10 MED ORDER — CHLORHEXIDINE GLUCONATE 0.12 % MT SOLN
15.0000 mL | Freq: Once | OROMUCOSAL | Status: AC
  Administered 2021-11-10: 15 mL via OROMUCOSAL

## 2021-11-10 MED ORDER — CEFAZOLIN SODIUM-DEXTROSE 2-4 GM/100ML-% IV SOLN
2.0000 g | INTRAVENOUS | Status: AC
  Administered 2021-11-10: 2 g via INTRAVENOUS
  Filled 2021-11-10: qty 100

## 2021-11-10 MED ORDER — METOCLOPRAMIDE HCL 5 MG PO TABS: 5.0000 mg | ORAL_TABLET | Freq: Three times a day (TID) | ORAL | Status: DC | PRN

## 2021-11-10 MED ORDER — HYDROMORPHONE HCL 2 MG PO TABS: 2.0000 mg | ORAL_TABLET | ORAL | Status: DC | PRN

## 2021-11-10 MED ORDER — LACTATED RINGERS IV SOLN: INTRAVENOUS | Status: DC

## 2021-11-10 MED ORDER — BISACODYL 10 MG RE SUPP
10.0000 mg | Freq: Every day | RECTAL | Status: DC | PRN
Start: 2021-11-10 — End: 2021-11-11

## 2021-11-10 MED ORDER — GABAPENTIN 300 MG PO CAPS
300.0000 mg | ORAL_CAPSULE | Freq: Three times a day (TID) | ORAL | Status: DC
Start: 2021-11-10 — End: 2021-11-11
  Administered 2021-11-10 – 2021-11-11 (×3): 300 mg via ORAL
  Filled 2021-11-10 (×3): qty 1

## 2021-11-10 MED ORDER — OXYCODONE HCL 5 MG PO TABS: 5.0000 mg | ORAL_TABLET | Freq: Once | ORAL | Status: AC | PRN

## 2021-11-10 MED ORDER — LOSARTAN POTASSIUM 50 MG PO TABS
100.0000 mg | ORAL_TABLET | Freq: Every morning | ORAL | Status: DC
  Administered 2021-11-11: 100 mg via ORAL
  Filled 2021-11-10: qty 2

## 2021-11-10 MED ORDER — FLEET ENEMA 7-19 GM/118ML RE ENEM: 1.0000 | ENEMA | Freq: Once | RECTAL | Status: DC | PRN

## 2021-11-10 MED ORDER — DEXAMETHASONE SODIUM PHOSPHATE 10 MG/ML IJ SOLN
10.0000 mg | Freq: Once | INTRAMUSCULAR | Status: AC
  Administered 2021-11-11: 10 mg via INTRAVENOUS
  Filled 2021-11-10: qty 1

## 2021-11-10 MED ORDER — TRANEXAMIC ACID-NACL 1000-0.7 MG/100ML-% IV SOLN
1000.0000 mg | INTRAVENOUS | Status: AC
  Administered 2021-11-10: 1000 mg via INTRAVENOUS
  Filled 2021-11-10: qty 100

## 2021-11-10 MED ORDER — SODIUM CHLORIDE (PF) 0.9 % IJ SOLN
INTRAMUSCULAR | Status: AC
  Filled 2021-11-10: qty 10

## 2021-11-10 MED ORDER — DEXAMETHASONE SODIUM PHOSPHATE 10 MG/ML IJ SOLN
INTRAMUSCULAR | Status: AC
  Filled 2021-11-10: qty 1

## 2021-11-10 MED ORDER — PROPOFOL 10 MG/ML IV BOLUS
INTRAVENOUS | Status: DC | PRN
  Administered 2021-11-10: 30 mg via INTRAVENOUS

## 2021-11-10 MED ORDER — DEXAMETHASONE SODIUM PHOSPHATE 10 MG/ML IJ SOLN
8.0000 mg | Freq: Once | INTRAMUSCULAR | Status: AC
  Administered 2021-11-10: 8 mg via INTRAVENOUS

## 2021-11-10 MED ORDER — OXYCODONE HCL 5 MG PO TABS
ORAL_TABLET | ORAL | Status: AC
  Administered 2021-11-10: 5 mg via ORAL
  Filled 2021-11-10: qty 1

## 2021-11-10 MED ORDER — SODIUM CHLORIDE (PF) 0.9 % IJ SOLN
INTRAMUSCULAR | Status: DC | PRN
  Administered 2021-11-10: 60 mL

## 2021-11-10 MED ORDER — METOCLOPRAMIDE HCL 5 MG/ML IJ SOLN: 5.0000 mg | Freq: Three times a day (TID) | INTRAMUSCULAR | Status: DC | PRN

## 2021-11-10 MED ORDER — METHOCARBAMOL 500 MG PO TABS
500.0000 mg | ORAL_TABLET | Freq: Four times a day (QID) | ORAL | Status: DC | PRN
  Administered 2021-11-11: 500 mg via ORAL
  Filled 2021-11-10: qty 1

## 2021-11-10 MED ORDER — ACETAMINOPHEN 500 MG PO TABS
1000.0000 mg | ORAL_TABLET | Freq: Four times a day (QID) | ORAL | Status: AC
  Administered 2021-11-10 – 2021-11-11 (×3): 1000 mg via ORAL
  Filled 2021-11-10 (×3): qty 2

## 2021-11-10 MED ORDER — SODIUM CHLORIDE 0.9 % IV SOLN: INTRAVENOUS | Status: DC

## 2021-11-10 MED ORDER — CEFAZOLIN SODIUM-DEXTROSE 2-4 GM/100ML-% IV SOLN
2.0000 g | Freq: Four times a day (QID) | INTRAVENOUS | Status: AC
  Administered 2021-11-10 – 2021-11-11 (×2): 2 g via INTRAVENOUS
  Filled 2021-11-10 (×2): qty 100

## 2021-11-10 MED ORDER — ONDANSETRON HCL 4 MG/2ML IJ SOLN
INTRAMUSCULAR | Status: AC
  Filled 2021-11-10: qty 2

## 2021-11-10 MED ORDER — BUPIVACAINE LIPOSOME 1.3 % IJ SUSP
INTRAMUSCULAR | Status: DC | PRN
  Administered 2021-11-10: 20 mL

## 2021-11-10 MED ORDER — TRIAMTERENE-HCTZ 37.5-25 MG PO TABS
0.5000 | ORAL_TABLET | Freq: Every day | ORAL | Status: DC
  Administered 2021-11-11: 0.5 via ORAL
  Filled 2021-11-10: qty 1

## 2021-11-10 MED ORDER — PANTOPRAZOLE SODIUM 40 MG PO TBEC
40.0000 mg | DELAYED_RELEASE_TABLET | Freq: Every day | ORAL | Status: DC
  Administered 2021-11-11: 40 mg via ORAL
  Filled 2021-11-10: qty 1

## 2021-11-10 MED ORDER — METHOCARBAMOL 500 MG IVPB - SIMPLE MED
INTRAVENOUS | Status: AC
  Administered 2021-11-10: 500 mg via INTRAVENOUS
  Filled 2021-11-10: qty 50

## 2021-11-10 MED ORDER — DOCUSATE SODIUM 100 MG PO CAPS
100.0000 mg | ORAL_CAPSULE | Freq: Two times a day (BID) | ORAL | Status: DC
  Administered 2021-11-11: 100 mg via ORAL
  Filled 2021-11-10: qty 1

## 2021-11-10 MED ORDER — POLYETHYLENE GLYCOL 3350 17 G PO PACK: 17.0000 g | PACK | Freq: Every day | ORAL | Status: DC | PRN

## 2021-11-10 MED ORDER — OXYCODONE HCL 5 MG/5ML PO SOLN: 5.0000 mg | Freq: Once | ORAL | Status: AC | PRN

## 2021-11-10 MED ORDER — ONDANSETRON HCL 4 MG PO TABS: 4.0000 mg | ORAL_TABLET | Freq: Four times a day (QID) | ORAL | Status: DC | PRN

## 2021-11-10 MED ORDER — HYDROMORPHONE HCL 1 MG/ML IJ SOLN
INTRAMUSCULAR | Status: AC
  Filled 2021-11-10: qty 1

## 2021-11-10 MED ORDER — SODIUM CHLORIDE 0.9 % IR SOLN
Status: DC | PRN
  Administered 2021-11-10 (×2): 1000 mL

## 2021-11-10 MED ORDER — ONDANSETRON HCL 4 MG/2ML IJ SOLN: 4.0000 mg | Freq: Four times a day (QID) | INTRAMUSCULAR | Status: DC | PRN

## 2021-11-10 MED ORDER — HYDROMORPHONE HCL 1 MG/ML IJ SOLN: 0.5000 mg | INTRAMUSCULAR | Status: DC | PRN

## 2021-11-10 MED ORDER — DIPHENHYDRAMINE HCL 12.5 MG/5ML PO ELIX: 12.5000 mg | ORAL_SOLUTION | ORAL | Status: DC | PRN

## 2021-11-10 MED ORDER — AMLODIPINE BESYLATE 5 MG PO TABS
5.0000 mg | ORAL_TABLET | Freq: Every day | ORAL | Status: DC
  Administered 2021-11-11: 5 mg via ORAL
  Filled 2021-11-10: qty 1

## 2021-11-10 MED ORDER — ONDANSETRON HCL 4 MG/2ML IJ SOLN: 4.0000 mg | Freq: Once | INTRAMUSCULAR | Status: DC | PRN

## 2021-11-10 MED ORDER — PROPOFOL 1000 MG/100ML IV EMUL
INTRAVENOUS | Status: AC
  Filled 2021-11-10: qty 100

## 2021-11-10 MED ORDER — ASPIRIN EC 325 MG PO TBEC
325.0000 mg | DELAYED_RELEASE_TABLET | Freq: Two times a day (BID) | ORAL | Status: DC
  Administered 2021-11-11: 325 mg via ORAL
  Filled 2021-11-10: qty 1

## 2021-11-10 MED ORDER — ACETAMINOPHEN 10 MG/ML IV SOLN
1000.0000 mg | Freq: Four times a day (QID) | INTRAVENOUS | Status: DC
  Administered 2021-11-10: 1000 mg via INTRAVENOUS
  Filled 2021-11-10: qty 100

## 2021-11-10 MED ORDER — MIDAZOLAM HCL 2 MG/2ML IJ SOLN
1.0000 mg | Freq: Once | INTRAMUSCULAR | Status: AC
  Administered 2021-11-10: 2 mg via INTRAVENOUS
  Filled 2021-11-10: qty 2

## 2021-11-10 MED ORDER — SODIUM CHLORIDE (PF) 0.9 % IJ SOLN
INTRAMUSCULAR | Status: AC
  Filled 2021-11-10: qty 50

## 2021-11-10 MED ORDER — BUPIVACAINE LIPOSOME 1.3 % IJ SUSP
INTRAMUSCULAR | Status: AC
  Filled 2021-11-10: qty 20

## 2021-11-10 MED ORDER — POVIDONE-IODINE 10 % EX SWAB
2.0000 "application " | Freq: Once | CUTANEOUS | Status: AC
  Administered 2021-11-10: 2 via TOPICAL

## 2021-11-10 MED ORDER — PHENOL 1.4 % MT LIQD: 1.0000 | OROMUCOSAL | Status: DC | PRN

## 2021-11-10 MED ORDER — MENTHOL 3 MG MT LOZG: 1.0000 | LOZENGE | OROMUCOSAL | Status: DC | PRN

## 2021-11-10 MED ORDER — BUPIVACAINE IN DEXTROSE 0.75-8.25 % IT SOLN
INTRATHECAL | Status: DC | PRN
  Administered 2021-11-10: 1.6 mL via INTRATHECAL

## 2021-11-10 MED ORDER — FENTANYL CITRATE PF 50 MCG/ML IJ SOSY
50.0000 ug | PREFILLED_SYRINGE | Freq: Once | INTRAMUSCULAR | Status: DC
  Filled 2021-11-10: qty 2

## 2021-11-10 MED ORDER — PROPOFOL 10 MG/ML IV BOLUS
INTRAVENOUS | Status: AC
  Filled 2021-11-10: qty 20

## 2021-11-10 MED ORDER — PHENYLEPHRINE HCL-NACL 20-0.9 MG/250ML-% IV SOLN
INTRAVENOUS | Status: DC | PRN
  Administered 2021-11-10: 50 ug/min via INTRAVENOUS
  Administered 2021-11-10: 25 ug/min via INTRAVENOUS

## 2021-11-10 MED ORDER — PROPOFOL 500 MG/50ML IV EMUL
INTRAVENOUS | Status: DC | PRN
  Administered 2021-11-10: 100 ug/kg/min via INTRAVENOUS

## 2021-11-10 MED ORDER — METHOCARBAMOL 500 MG IVPB - SIMPLE MED
500.0000 mg | Freq: Four times a day (QID) | INTRAVENOUS | Status: DC | PRN
  Filled 2021-11-10: qty 50

## 2021-11-10 MED ORDER — ORAL CARE MOUTH RINSE: 15.0000 mL | Freq: Once | OROMUCOSAL | Status: AC

## 2021-11-10 MED ORDER — ONDANSETRON HCL 4 MG/2ML IJ SOLN
INTRAMUSCULAR | Status: DC | PRN
  Administered 2021-11-10: 4 mg via INTRAVENOUS

## 2021-11-10 SURGICAL SUPPLY — 57 items
ATTUNE PS FEM RT SZ 4 CEM KNEE (Femur) ×1 IMPLANT
ATTUNE PSRP INSR SZ4 12 KNEE (Insert) ×1 IMPLANT
BAG COUNTER SPONGE SURGICOUNT (BAG) ×1 IMPLANT
BAG SPEC THK2 15X12 ZIP CLS (MISCELLANEOUS) ×1
BAG SPNG CNTER NS LX DISP (BAG) ×1
BAG ZIPLOCK 12X15 (MISCELLANEOUS) ×3 IMPLANT
BASEPLATE TIBIAL ROTATING SZ 4 (Knees) ×1 IMPLANT
BLADE SAG 18X100X1.27 (BLADE) ×3 IMPLANT
BLADE SAW SGTL 11.0X1.19X90.0M (BLADE) ×3 IMPLANT
BNDG ELASTIC 6X5.8 VLCR STR LF (GAUZE/BANDAGES/DRESSINGS) ×3 IMPLANT
BOWL SMART MIX CTS (DISPOSABLE) ×3 IMPLANT
BSPLAT TIB 4 CMNT ROT PLAT STR (Knees) ×1 IMPLANT
CEMENT HV SMART SET (Cement) ×6 IMPLANT
COVER SURGICAL LIGHT HANDLE (MISCELLANEOUS) ×3 IMPLANT
CUFF TOURN SGL QUICK 34 (TOURNIQUET CUFF) ×2
CUFF TRNQT CYL 34X4.125X (TOURNIQUET CUFF) ×2 IMPLANT
DRAPE INCISE IOBAN 66X45 STRL (DRAPES) ×3 IMPLANT
DRAPE U-SHAPE 47X51 STRL (DRAPES) ×3 IMPLANT
DRSG AQUACEL AG ADV 3.5X10 (GAUZE/BANDAGES/DRESSINGS) ×3 IMPLANT
DURAPREP 26ML APPLICATOR (WOUND CARE) ×3 IMPLANT
ELECT REM PT RETURN 15FT ADLT (MISCELLANEOUS) ×3 IMPLANT
GLOVE BIO SURGEON STRL SZ 6.5 (GLOVE) ×3 IMPLANT
GLOVE BIO SURGEON STRL SZ7 (GLOVE) ×3 IMPLANT
GLOVE BIO SURGEON STRL SZ8 (GLOVE) ×6 IMPLANT
GLOVE BIOGEL PI IND STRL 7.0 (GLOVE) ×2 IMPLANT
GLOVE BIOGEL PI IND STRL 8 (GLOVE) ×2 IMPLANT
GLOVE BIOGEL PI IND STRL 8.5 (GLOVE) ×2 IMPLANT
GLOVE BIOGEL PI INDICATOR 7.0 (GLOVE) ×1
GLOVE BIOGEL PI INDICATOR 8 (GLOVE) ×1
GLOVE BIOGEL PI INDICATOR 8.5 (GLOVE) ×1
GOWN STRL REUS W/ TWL LRG LVL3 (GOWN DISPOSABLE) ×2 IMPLANT
GOWN STRL REUS W/TWL LRG LVL3 (GOWN DISPOSABLE) ×2
HANDPIECE INTERPULSE COAX TIP (DISPOSABLE) ×2
HOLDER FOLEY CATH W/STRAP (MISCELLANEOUS) ×1 IMPLANT
IMMOBILIZER KNEE 20 (SOFTGOODS) ×2
IMMOBILIZER KNEE 20 THIGH 36 (SOFTGOODS) ×2 IMPLANT
KIT TURNOVER KIT A (KITS) IMPLANT
MANIFOLD NEPTUNE II (INSTRUMENTS) ×3 IMPLANT
NS IRRIG 1000ML POUR BTL (IV SOLUTION) ×3 IMPLANT
PACK TOTAL KNEE CUSTOM (KITS) ×3 IMPLANT
PADDING CAST COTTON 6X4 STRL (CAST SUPPLIES) ×5 IMPLANT
PATELLA MEDIAL ATTUN 35MM KNEE (Knees) ×1 IMPLANT
PROTECTOR NERVE ULNAR (MISCELLANEOUS) ×3 IMPLANT
SET HNDPC FAN SPRY TIP SCT (DISPOSABLE) ×2 IMPLANT
SLEEVE SCD COMPRESS KNEE MED (STOCKING) ×1 IMPLANT
SPIKE FLUID TRANSFER (MISCELLANEOUS) ×3 IMPLANT
STRIP CLOSURE SKIN 1/2X4 (GAUZE/BANDAGES/DRESSINGS) ×6 IMPLANT
SUT MNCRL AB 4-0 PS2 18 (SUTURE) ×3 IMPLANT
SUT STRATAFIX 0 PDS 27 VIOLET (SUTURE) ×2
SUT VIC AB 2-0 CT1 27 (SUTURE) ×6
SUT VIC AB 2-0 CT1 TAPERPNT 27 (SUTURE) ×6 IMPLANT
SUTURE STRATFX 0 PDS 27 VIOLET (SUTURE) ×2 IMPLANT
TRAY FOLEY MTR SLVR 14FR STAT (SET/KITS/TRAYS/PACK) ×1 IMPLANT
TRAY FOLEY MTR SLVR 16FR STAT (SET/KITS/TRAYS/PACK) ×2 IMPLANT
TUBE SUCTION HIGH CAP CLEAR NV (SUCTIONS) ×3 IMPLANT
WATER STERILE IRR 1000ML POUR (IV SOLUTION) ×6 IMPLANT
WRAP KNEE MAXI GEL POST OP (GAUZE/BANDAGES/DRESSINGS) ×3 IMPLANT

## 2021-11-10 NOTE — Progress Notes (Signed)
Assisted Dr. Foster with right, adductor canal, ultrasound guided block. Side rails up, monitors on throughout procedure. See vital signs in flow sheet. Tolerated Procedure well.  

## 2021-11-10 NOTE — Discharge Instructions (Addendum)
? ?Ollen Gross, MD ?Total Joint Specialist ?[facility] ?[address] ?[place], [place] [address] ?[phone] ? ?TOTAL KNEE REPLACEMENT POSTOPERATIVE DIRECTIONS ? ? ? ?Knee Rehabilitation, Guidelines Following Surgery  ?Results after knee surgery are often greatly improved when you follow the exercise, range of motion and muscle strengthening exercises prescribed by your doctor. Safety measures are also important to protect the knee from further injury. If any of these exercises cause you to have increased pain or swelling in your knee joint, decrease the amount until you are comfortable again and slowly increase them. If you have problems or questions, call your caregiver or physical therapist for advice.  ? ?BLOOD CLOT PREVENTION ?Take a 325 mg Aspirin two times a day for three weeks following surgery. Then resume an 81 mg Aspirin once a day. ?You may resume your vitamins/supplements upon discharge from the hospital. ?Do not take any NSAIDs (Advil, Aleve, Ibuprofen, Meloxicam, etc.) until you have discontinued the 325 mg Aspirin.  ? ?HOME CARE INSTRUCTIONS  ?Remove items at home which could result in a fall. This includes throw rugs or furniture in walking pathways.  ?ICE to the affected knee as much as tolerated. Icing helps control swelling. If the swelling is well controlled you will be more comfortable and rehab easier. Continue to use ice on the knee for pain and swelling from surgery. You may notice swelling that will progress down to the foot and ankle. This is normal after surgery. Elevate the leg when you are not up walking on it.    ?Continue to use the breathing machine which will help keep your temperature down. It is common for your temperature to cycle up and down following surgery, especially at night when you are not up moving around and exerting yourself. The breathing machine keeps your lungs expanded and your temperature down. ?Do not place pillow under the  operative knee, focus on keeping the knee straight while resting ? ?DIET ?You may resume your previous home diet once you are discharged from the hospital. ? ?DRESSING / WOUND CARE / SHOWERING ?Keep your bulky bandage on for 2 days. On the third post-operative day you may remove the Ace bandage and gauze. There is a waterproof adhesive bandage on your skin which will stay in place until your first follow-up appointment. Once you remove this you will not need to place another bandage ?You may begin showering 3 days following surgery, but do not submerge the incision under water. ? ?ACTIVITY ?For the first 5 days, the key is rest and control of pain and swelling ?Do your home exercises twice a day starting on post-operative day 3. On the days you go to physical therapy, just do the home exercises once that day. ?You should rest, ice and elevate the leg for 50 minutes out of every hour. Get up and walk/stretch for 10 minutes per hour. After 5 days you can increase your activity slowly as tolerated. ?Walk with your walker as instructed. Use the walker until you are comfortable transitioning to a cane. Walk with the cane in the opposite hand of the operative leg. You may discontinue the cane once you are comfortable and walking steadily. ?Avoid periods of inactivity such as sitting longer than an hour when not asleep. This helps prevent blood clots.  ?You may discontinue the knee immobilizer once you are able to perform a straight leg raise while lying down. ?You may resume a sexual relationship in one month or when given the [facility]  by your doctor.  ?You may return to work once you are cleared by your doctor.  ?Do not drive a car for 6 weeks or until released by your surgeon.  ?Do not drive while taking narcotics. ? ?TED HOSE STOCKINGS ?Wear the elastic stockings on both legs for three weeks following surgery during the day. You may remove them at night for sleeping. ? ?WEIGHT BEARING ?Weight bearing as tolerated with assist  device (walker, cane, etc) as directed, use it as long as suggested by your surgeon or therapist, typically at least 4-6 weeks. ? ?POSTOPERATIVE CONSTIPATION PROTOCOL ?Constipation - defined medically as fewer than three stools per week and severe constipation as less than one stool per week. ? ?One of the most common issues patients have following surgery is constipation.  Even if you have a regular bowel pattern at home, your normal regimen is likely to be disrupted due to multiple reasons following surgery.  Combination of anesthesia, postoperative narcotics, change in appetite and fluid intake all can affect your bowels.  In order to avoid complications following surgery, here are some recommendations in order to help you during your recovery period. ? ?Colace (docusate) - Pick up an over-the-counter form of Colace or another stool softener and take twice a day as long as you are requiring postoperative pain medications.  Take with a full glass of water daily.  If you experience loose stools or diarrhea, hold the colace until you stool forms back up. If your symptoms do not get better within 1 week or if they get worse, check with your doctor. ?Dulcolax (bisacodyl) - Pick up over-the-counter and take as directed by the product packaging as needed to assist with the movement of your bowels.  Take with a full glass of water.  Use this product as needed if not relieved by Colace only.  ?MiraLax (polyethylene glycol) - Pick up over-the-counter to have on hand. MiraLax is a solution that will increase the amount of water in your bowels to assist with bowel movements.  Take as directed and can mix with a glass of water, juice, soda, coffee, or tea. Take if you go more than two days without a movement. Do not use MiraLax more than once per day. Call your doctor if you are still constipated or irregular after using this medication for 7 days in a row. ? ?If you continue to have problems with postoperative constipation,  please contact the office for further assistance and recommendations.  If you experience "the worst abdominal pain ever" or develop nausea or vomiting, please contact the office immediatly for further recommendations for treatment. ? ?ITCHING ?If you experience itching with your medications, try taking only a single pain pill, or even half a pain pill at a time.  You can also use Benadryl over the counter for itching or also to help with sleep.  ? ?MEDICATIONS ?See your medication summary on the ?After Visit Summary? that the nursing staff will review with you prior to discharge.  You may have some home medications which will be placed on hold until you complete the course of blood thinner medication.  It is important for you to complete the blood thinner medication as prescribed by your surgeon.  Continue your approved medications as instructed at time of discharge. ? ?PRECAUTIONS ?If you experience chest pain or shortness of breath - call 911 immediately for transfer to the hospital emergency department.  ?If you develop a fever greater that 101 F, purulent drainage from wound, increased  redness or drainage from wound, foul odor from the wound/dressing, or calf pain - CONTACT YOUR SURGEON.   ?                                                ?FOLLOW-UP APPOINTMENTS ?Make sure you keep all of your appointments after your operation with your surgeon and caregivers. You should call the office at the above phone number and make an appointment for approximately two weeks after the date of your surgery or on the date instructed by your surgeon outlined in the "After Visit Summary". ? ?RANGE OF MOTION AND STRENGTHENING EXERCISES  ?Rehabilitation of the knee is important following a knee injury or an operation. After just a few days of immobilization, the muscles of the thigh which control the knee become weakened and shrink (atrophy). Knee exercises are designed to build up the tone and strength of the thigh muscles and to  improve knee motion. Often times heat used for twenty to thirty minutes before working out will loosen up your tissues and help with improving the range of motion but do not use heat for the first two w

## 2021-11-10 NOTE — Plan of Care (Signed)
?  Problem: Activity: Goal: Range of joint motion will improve Outcome: Progressing   

## 2021-11-10 NOTE — Interval H&P Note (Signed)
History and Physical Interval Note: ? ?11/10/2021 ?11:28 AM ? ?[facility] Kelly Castaneda  has presented today for surgery, with the diagnosis of right knee osteoarthritis.  The various methods of treatment have been discussed with the patient and family. After consideration of risks, benefits and other options for treatment, the patient has consented to  Procedure(s): ?TOTAL KNEE ARTHROPLASTY (Right) as a surgical intervention.  The patient's history has been reviewed, patient examined, no change in status, stable for surgery.  I have reviewed the patient's chart and labs.  Questions were answered to the patient's satisfaction.   ? ? ?Kelly Castaneda ? ? ?

## 2021-11-10 NOTE — Care Plan (Signed)
Ortho Bundle Case Management Note ? ?Patient Details  ?Name: [facility] ?MRN: [id number] ?Date of Birth: [birth year] ? ?                ?R TKA on 11-10-21 DCP: Home with brother in [place] for 1-2 weeks, then home to [place] alone. DME: RW ordered PT: [facility]. Referral sent for [facility] 11-13-21. ? ? ?DME Arranged:  Walker rolling ?DME Agency:  [employer] ? ?HH Arranged:    ?HH Agency:    ? ?Additional Comments: ?Please contact me with any questions of if this plan should need to change. ? ?Collene Schlichter  [phone] ?11/10/2021, 4:18 PM ?  ?

## 2021-11-10 NOTE — Transfer of Care (Signed)
Immediate Anesthesia Transfer of Care Note ? ?Patient: [facility] ? ?Procedure(s) Performed: TOTAL KNEE ARTHROPLASTY (Right: Knee) ? ?Patient Location: PACU ? ?Anesthesia Type:Regional and Spinal ? ?Level of Consciousness: sedated ? ?Airway & Oxygen Therapy: Patient Spontanous Breathing and Patient connected to face mask oxygen ? ?Post-op Assessment: Report given to RN and Post -op Vital signs reviewed and stable ? ?Post vital signs: Reviewed and stable ? ?Last Vitals:  ?Vitals Value Taken Time  ?BP    ?Temp    ?Pulse 63 11/10/21 1428  ?Resp    ?SpO2 100 % 11/10/21 1428  ?Vitals shown include unvalidated device data. ? ?Last Pain:  ?Vitals:  ? 11/10/21 1158  ?TempSrc:   ?PainSc: 0-No pain  ?   ? ?  ? ?Complications: No notable events documented. ?

## 2021-11-10 NOTE — Anesthesia Procedure Notes (Signed)
Anesthesia Regional Block: Adductor canal block  ? ?Pre-Anesthetic Checklist: , timeout performed,  Correct Patient, Correct Site, Correct Laterality,  Correct Procedure, Correct Position, site marked,  Risks and benefits discussed,  Surgical consent,  Pre-op evaluation,  At surgeon's request and post-op pain management ? ?Laterality: Right ? ?Prep: chloraprep     ?  ?Needles:  ?Injection technique: Single-shot ? ?Needle Type: Echogenic Stimulator Needle   ? ? ?Needle Length: 10cm  ?Needle Gauge: 21  ? ?Needle insertion depth: 8 cm ? ? ?Additional Needles: ? ? ?Procedures:,,,, ultrasound used (permanent image in chart),,    ?Narrative:  ?Start time: 11/10/2021 11:51 AM ?End time: 11/10/2021 11:56 AM ?Injection made incrementally with aspirations every 5 mL. ? ?Performed by: Personally  ?Anesthesiologist: Mal Amabile, MD ? ?Additional Notes: ?Timeout performed. Patient sedated. Relevant anatomy ID'd using [place]. Incremental 2-18ml injection of LA with frequent aspiration. Patient tolerated procedure well. ? ? ? ?Right Adductor Canal Block ? ?

## 2021-11-10 NOTE — Op Note (Signed)
OPERATIVE REPORT-TOTAL KNEE ARTHROPLASTY ? ? ?Pre-operative diagnosis- Osteoarthritis  Right knee(s) ? ?Post-operative diagnosis- Osteoarthritis Right knee(s) ? ?Procedure-  Right  Total Knee Arthroplasty ? ?Surgeon- Gus Rankin. Lanae Federer, MD ? ?Assistant- Leilani Able, PA-C  ? ?Anesthesia-   Adductor canal block and spinal ? ?EBL-25 ml ?  ?Drains None ? ?Tourniquet time-  ?Total Tourniquet Time Documented: ?Thigh (Right) - 36 minutes ?Total: Thigh (Right) - 36 minutes ?   ? ?Complications- None ? ?Condition-PACU - hemodynamically stable.  ? ?Brief Clinical Note  Kelly Castaneda is a 71 y.o. year old female with end stage OA of her right knee with progressively worsening pain and dysfunction. She has constant pain, with activity and at rest and significant functional deficits with difficulties even with ADLs. She has had extensive non-op management including analgesics, injections of cortisone and viscosupplements, and home exercise program, but remains in significant pain with significant dysfunction.Radiographs show bone on bone arthritis Medial and patellofemoral. She presents now for right Total Knee Arthroplasty.    ? ?Procedure in detail--- ? ? The patient is brought into the operating room and positioned supine on the operating table. After successful administration of  Adductor canal block and spinal,   a tourniquet is placed high on the  Right thigh(s) and the lower extremity is prepped and draped in the usual sterile fashion. Time out is performed by the operating team and then the  Right lower extremity is wrapped in Esmarch, knee flexed and the tourniquet inflated to 300 mmHg.  ?     A midline incision is made with a ten blade through the subcutaneous tissue to the level of the extensor mechanism. A fresh blade is used to make a medial parapatellar arthrotomy. Soft tissue over the proximal medial tibia is subperiosteally elevated to the joint line with a knife and into the semimembranosus bursa with a  Cobb elevator. Soft tissue over the proximal lateral tibia is elevated with attention being paid to avoiding the patellar tendon on the tibial tubercle. The patella is everted, knee flexed 90 degrees and the ACL and PCL are removed. Findings are bone on bone medial and patellofemoral with large global osteophytes.   ?     The drill is used to create a starting hole in the distal femur and the canal is thoroughly irrigated with sterile saline to remove the fatty contents. The 5 degree Right  valgus alignment guide is placed into the femoral canal and the distal femoral cutting block is pinned to remove 9 mm off the distal femur. Resection is made with an oscillating saw. ?     The tibia is subluxed forward and the menisci are removed. The extramedullary alignment guide is placed referencing proximally at the medial aspect of the tibial tubercle and distally along the second metatarsal axis and tibial crest. The block is pinned to remove 65mm off the more deficient medial  side. Resection is made with an oscillating saw. Size 4is the most appropriate size for the tibia and the proximal tibia is prepared with the modular drill and keel punch for that size. ?     The femoral sizing guide is placed and size 4 is most appropriate. Rotation is marked off the epicondylar axis and confirmed by creating a rectangular flexion gap at 90 degrees. The size 4 cutting block is pinned in this rotation and the anterior, posterior and chamfer cuts are made with the oscillating saw. The intercondylar block is then placed and that cut is made. ?  Trial size 4 tibial component, trial size 4 posterior stabilized femur and a 12  mm posterior stabilized rotating platform insert trial is placed. Full extension is achieved with excellent varus/valgus and anterior/posterior balance throughout full range of motion. The patella is everted and thickness measured to be 22  mm. Free hand resection is taken to 12 mm, a 35 template is placed, lug  holes are drilled, trial patella is placed, and it tracks normally. Osteophytes are removed off the posterior femur with the trial in place. All trials are removed and the cut bone surfaces prepared with pulsatile lavage. Cement is mixed and once ready for implantation, the size 4 tibial implant, size  4 posterior stabilized femoral component, and the size 35 patella are cemented in place and the patella is held with the clamp. The trial insert is placed and the knee held in full extension. The Exparel (20 ml mixed with 60 ml saline) is injected into the extensor mechanism, posterior capsule, medial and lateral gutters and subcutaneous tissues.  All extruded cement is removed and once the cement is hard the permanent 12 mm posterior stabilized rotating platform insert is placed into the tibial tray. ?     The wound is copiously irrigated with saline solution and the extensor mechanism closed with # 0 Stratofix suture. The tourniquet is released for a total tourniquet time of 36  minutes. Flexion against gravity is 140 degrees and the patella tracks normally. Subcutaneous tissue is closed with 2.0 vicryl and subcuticular with running 4.0 Monocryl. The incision is cleaned and dried and steri-strips and a bulky sterile dressing are applied. The limb is placed into a knee immobilizer and the patient is awakened and transported to recovery in stable condition. ?     Please note that a surgical assistant was a medical necessity for this procedure in order to perform it in a safe and expeditious manner. Surgical assistant was necessary to retract the ligaments and vital neurovascular structures to prevent injury to them and also necessary for proper positioning of the limb to allow for anatomic placement of the prosthesis. ? ? ?Gus Rankin Kingsley Farace, MD ? ? ? ?11/10/2021, 1:29 PM ? ? ?

## 2021-11-10 NOTE — Anesthesia Procedure Notes (Signed)
Spinal ? ?Patient location during procedure: OR ?Start time: 11/10/2021 12:23 PM ?End time: 11/10/2021 12:26 PM ?Staffing ?Performed: resident/CRNA  ?Anesthesiologist: Mal Amabile, MD ?Resident/CRNA: Doran Clay, CRNA ?Preanesthetic Checklist ?Completed: patient identified, IV checked, site marked, risks and benefits discussed, surgical consent, monitors and equipment checked, pre-op evaluation and timeout performed ?Spinal Block ?Patient position: sitting ?Prep: DuraPrep ?Patient monitoring: heart rate, cardiac monitor, continuous pulse ox and blood pressure ?Approach: midline ?Location: L3-4 ?Injection technique: single-shot ?Needle ?Needle type: Pencan  ?Needle gauge: 24 G ?Needle length: 10 cm ?Needle insertion depth: 8 cm ?Assessment ?Sensory level: T6 ?Additional Notes ?Timeout performed. Patient in sitting position. Site identified. Duraprep applied. SAB without difficulty. Patient placed on r side for 3 minutes. Then supine. ? ? ? ?

## 2021-11-10 NOTE — Evaluation (Signed)
Physical Therapy Evaluation ?Patient Details ?Name: Kelly Castaneda ?MRN: [id number] ?DOB: [birth year] ?Today's Date: 11/10/2021 ? ?History of Present Illness ? Pt is a 71yo female presenting s/p R-TKA on 11/10/21. PMH: Bell's palsy, OA, CHF, mobitz II second degree heart block with pacemaker, HTN, GERD, hypothyroidism, migraine, L-TKA 2015, lumbar surgery.  ?Clinical Impression ? Kelly Castaneda is a [age] y.o. female POD 0 s/p R-TKA. Patient reports modified independence with mobility at baseline. Patient is now limited by functional impairments (see PT problem list below) and requires min guard for bed mobility, min assist for transfers, and min guard for gait with RW. Patient was able to ambulate 3 feet with RW and min guard assist with R knee in KI. Patient instructed in exercise to facilitate ROM and circulation to manage edema. Patient will benefit from continued skilled PT interventions to address impairments and progress towards PLOF. Acute PT will follow to progress mobility and stair training in preparation for safe discharge home. ?   ?   ? ?Recommendations for follow up therapy are one component of a multi-disciplinary discharge planning process, led by the attending physician.  Recommendations may be updated based on patient status, additional functional criteria and insurance authorization. ? ?Follow Up Recommendations Follow physician's recommendations for discharge plan and follow up therapies ? ?  ?Assistance Recommended at Discharge Set up Supervision/Assistance  ?Patient can return home with the following ? A little help with walking and/or transfers;A little help with bathing/dressing/bathroom;Assistance with cooking/housework;Assist for transportation;Help with stairs or ramp for entrance ? ?  ?Equipment Recommendations Rolling walker (2 wheels)  ?Recommendations for Other Services ?    ?  ?Functional Status Assessment Patient has had a recent decline in their functional status and demonstrates  the ability to make significant improvements in function in a reasonable and predictable amount of time.  ? ?  ?Precautions / Restrictions Precautions ?Precautions: Fall ?Required Braces or Orthoses: Knee Immobilizer - Right ?Knee Immobilizer - Right: On when out of bed or walking ?Restrictions ?Weight Bearing Restrictions: No  ? ?  ? ?Mobility ? Bed Mobility ?Overal bed mobility: Needs Assistance ?Bed Mobility: Supine to Sit, Sit to Supine ?  ?  ?Supine to sit: Min guard ?Sit to supine: Min guard ?  ?General bed mobility comments: Pt min guard for safety and line management only, no physical assist required. ?  ? ?Transfers ?Overall transfer level: Needs assistance ?Equipment used: Rolling walker (2 wheels) ?Transfers: Sit to/from Stand ?Sit to Stand: From elevated surface, Min assist ?  ?  ?  ?  ?  ?General transfer comment: Pt min assist for steadying of RW secondary to placement of IV line on anterior surface of L wrist and pt unable to use LUE to power up; pt provided lift power to standing, only steadying provided by PT. ?  ? ?Ambulation/Gait ?Ambulation/Gait assistance: Min guard ?Gait Distance (Feet): 3 Feet ?Assistive device: Rolling walker (2 wheels) ?Gait Pattern/deviations: WFL(Within Functional Limits) ?  ?  ?  ?General Gait Details: Pt performed sidesteps EOB to adjust position while in bed, min guard for safety only, VCs for sequencing. ? ?Stairs ?  ?  ?  ?  ?  ? ?Wheelchair Mobility ?  ? ?Modified Rankin (Stroke Patients Only) ?  ? ?  ? ?Balance Overall balance assessment: Needs assistance ?Sitting-balance support: Feet unsupported, Single extremity supported ?Sitting balance-Leahy Scale: Fair ?  ?  ?Standing balance support: Bilateral upper extremity supported, During functional activity, Reliant on assistive device for balance ?  Standing balance-Leahy Scale: Poor ?Standing balance comment: Pt reliant on BUE support on RW, but unable to bear weight fully through LUE. ?  ?  ?  ?  ?  ?  ?  ?  ?  ?  ?   ?   ? ? ? ?Pertinent Vitals/Pain Pain Assessment ?Pain Assessment: No/denies pain  ? ? ?Home Living Family/patient expects to be discharged to:: Private residence ?Living Arrangements: Alone ?Available Help at Discharge: Friend(s);Available 24 hours/day (Brother [place] and [place]) ?Type of Home: House ?Home Access: Ramped entrance ?  ?  ?  ?Home Layout: One level ?Home Equipment: Gilmer Mor - single point;Cane - quad;Shower seat ?   ?  ?Prior Function Prior Level of Function : Independent/Modified Independent ?  ?  ?  ?  ?  ?  ?Mobility Comments: SPC during high levels of pain, but mostly independent ?ADLs Comments: IND ?  ? ? ?Hand Dominance  ? Dominant Hand: Right ? ?  ?Extremity/Trunk Assessment  ? Upper Extremity Assessment ?Upper Extremity Assessment: Overall WFL for tasks assessed ?  ? ?Lower Extremity Assessment ?Lower Extremity Assessment: RLE deficits/detail;LLE deficits/detail ?RLE Deficits / Details: MMT: ank pf/df 5/5, no extensor lag noted ?RLE Sensation: WNL ?LLE Deficits / Details: MMT ank pf/df 5/5 ?LLE Sensation: WNL ?  ? ?Cervical / Trunk Assessment ?Cervical / Trunk Assessment: Back Surgery  ?Communication  ? Communication: No difficulties  ?Cognition Arousal/Alertness: Awake/alert ?Behavior During Therapy: [facility] for tasks assessed/performed ?Overall Cognitive Status: Within Functional Limits for tasks assessed ?  ?  ?  ?  ?  ?  ?  ?  ?  ?  ?  ?  ?  ?  ?  ?  ?  ?  ?  ? ?  ?General Comments General comments (skin integrity, edema, etc.): Pt on 2LO2 via [place] upon entry, removed for session and at end of session SpO2 seen to drop to 89% so placed pt back on 2LO2 via [place], SpO2 rose to 97%. VSS otherwise. ? ?  ?Exercises Total Joint Exercises ?Ankle Circles/Pumps: AROM, 20 reps, Both  ? ?Assessment/Plan  ?  ?PT Assessment Patient needs continued PT services  ?PT Problem List Decreased strength;Decreased range of motion;Decreased activity tolerance;Decreased balance;Decreased mobility;Decreased  coordination;Decreased knowledge of use of DME;Pain;Decreased knowledge of precautions ? ?   ?  ?PT Treatment Interventions DME instruction;Gait training;Stair training;Functional mobility training;Therapeutic activities;Therapeutic exercise;Balance training;Neuromuscular re-education;Patient/family education   ? ?PT Goals (Current goals can be found in the Care Plan section)  ?Acute Rehab PT Goals ?Patient Stated Goal: Get back to walking ?PT Goal Formulation: With patient ?Time For Goal Achievement: 11/17/21 ?Potential to Achieve Goals: Good ? ?  ?Frequency 7X/week ?  ? ? ?Co-evaluation   ?  ?  ?  ?  ? ? ?  ?AM-PAC PT "6 Clicks" Mobility  ?Outcome Measure Help needed turning from your back to your side while in a flat bed without using bedrails?: A Little ?Help needed moving from lying on your back to sitting on the side of a flat bed without using bedrails?: A Little ?Help needed moving to and from a bed to a chair (including a wheelchair)?: A Little ?Help needed standing up from a chair using your arms (e.g., wheelchair or bedside chair)?: A Little ?Help needed to walk in hospital room?: A Little ?Help needed climbing 3-5 steps with a railing? : A Little ?6 Click Score: 18 ? ?  ?End of Session Equipment Utilized During Treatment: Gait belt;Oxygen ?Activity Tolerance: Patient  tolerated treatment well ?  ?  ?PT Visit Diagnosis: Pain;Difficulty in walking, not elsewhere classified (R26.2) ?Pain - Right/Left: Right ?Pain - part of body: Knee ?  ? ?Time: 7412-8786 ?PT Time Calculation (min) (ACUTE ONLY): 28 min ? ? ?Charges:   PT Evaluation ?$PT Eval Low Complexity: 1 Low ?PT Treatments ?$Therapeutic Activity: 8-22 mins ?  ?   ?Kelly Castaneda, PT, DPT ?[facility] Rehabilitation Department ?Office: [phone] ?Pager: [phone] ? ?Kelly Castaneda ?11/10/2021, 5:50 PM ? ?

## 2021-11-10 NOTE — Progress Notes (Signed)
Orthopedic Tech Progress Note ?Patient Details:  ?[name] ?Jul 18, 1951 ?[id number] ? ?CPM Right Knee ?CPM Right Knee: On ?Right Knee Flexion (Degrees): 40 ?Right Knee Extension (Degrees): 10 ? ?Post Interventions ?Patient Tolerated: Well ?Instructions Provided: Care of device, Adjustment of device ? ?Kelly Castaneda ?11/10/2021, 2:39 PM ? ?

## 2021-11-10 NOTE — Anesthesia Postprocedure Evaluation (Signed)
Anesthesia Post Note ? ?Patient: [facility] ? ?Procedure(s) Performed: TOTAL KNEE ARTHROPLASTY (Right: Knee) ? ?  ? ?Patient location during evaluation: PACU ?Anesthesia Type: Spinal ?Level of consciousness: oriented and awake and alert ?Pain management: pain level controlled ?Vital Signs Assessment: post-procedure vital signs reviewed and stable ?Respiratory status: spontaneous breathing, respiratory function stable and nonlabored ventilation ?Cardiovascular status: blood pressure returned to baseline and stable ?Postop Assessment: no headache, no backache, no apparent nausea or vomiting, spinal receding and patient able to bend at knees ?Anesthetic complications: no ? ? ?No notable events documented. ? ?Last Vitals:  ?Vitals:  ? 11/10/21 1500 11/10/21 1535  ?BP: 131/63 (!) 142/75  ?Pulse: 65 62  ?Resp: 19 16  ?Temp:  (!) 36.4 ?C  ?SpO2: 90% 96%  ?  ?Last Pain:  ?Vitals:  ? 11/10/21 1500  ?TempSrc:   ?PainSc: 0-No pain  ? ? ?  ?  ?  ?  ?  ?  ? ?Michiko Lineman A. ? ? ? ? ?

## 2021-11-11 ENCOUNTER — Encounter (HOSPITAL_COMMUNITY): Payer: Self-pay | Admitting: Orthopedic Surgery

## 2021-11-11 DIAGNOSIS — Z7982 Long term (current) use of aspirin: Secondary | ICD-10-CM | POA: Diagnosis not present

## 2021-11-11 DIAGNOSIS — E039 Hypothyroidism, unspecified: Secondary | ICD-10-CM | POA: Diagnosis not present

## 2021-11-11 DIAGNOSIS — M1711 Unilateral primary osteoarthritis, right knee: Secondary | ICD-10-CM | POA: Diagnosis not present

## 2021-11-11 DIAGNOSIS — Z96651 Presence of right artificial knee joint: Secondary | ICD-10-CM | POA: Diagnosis not present

## 2021-11-11 DIAGNOSIS — Z96652 Presence of left artificial knee joint: Secondary | ICD-10-CM | POA: Diagnosis not present

## 2021-11-11 DIAGNOSIS — Z950 Presence of cardiac pacemaker: Secondary | ICD-10-CM | POA: Diagnosis not present

## 2021-11-11 DIAGNOSIS — Z79899 Other long term (current) drug therapy: Secondary | ICD-10-CM | POA: Diagnosis not present

## 2021-11-11 DIAGNOSIS — I509 Heart failure, unspecified: Secondary | ICD-10-CM | POA: Diagnosis not present

## 2021-11-11 DIAGNOSIS — I110 Hypertensive heart disease with heart failure: Secondary | ICD-10-CM | POA: Diagnosis not present

## 2021-11-11 LAB — CBC
HCT: 34.5 % — ABNORMAL LOW (ref 36.0–46.0)
Hemoglobin: 11.6 g/dL — ABNORMAL LOW (ref 12.0–15.0)
MCH: 32.6 pg (ref 26.0–34.0)
MCHC: 33.6 g/dL (ref 30.0–36.0)
MCV: 96.9 fL (ref 80.0–100.0)
Platelets: 185 10*3/uL (ref 150–400)
RBC: 3.56 MIL/uL — ABNORMAL LOW (ref 3.87–5.11)
RDW: 12.5 % (ref 11.5–15.5)
WBC: 9.4 10*3/uL (ref 4.0–10.5)
nRBC: 0 % (ref 0.0–0.2)

## 2021-11-11 LAB — BASIC METABOLIC PANEL
Anion gap: 5 (ref 5–15)
BUN: 15 mg/dL (ref 8–23)
CO2: 25 mmol/L (ref 22–32)
Calcium: 9 mg/dL (ref 8.9–10.3)
Chloride: 109 mmol/L (ref 98–111)
Creatinine, Ser: 0.77 mg/dL (ref 0.44–1.00)
GFR, Estimated: >60 mL/min (ref >60)
Glucose, Bld: 142 mg/dL — ABNORMAL HIGH (ref 70–99)
Potassium: 4.2 mmol/L (ref 3.5–5.1)
Sodium: 139 mmol/L (ref 135–145)

## 2021-11-11 MED ORDER — TRAMADOL HCL 50 MG PO TABS: 50.0000 mg | ORAL_TABLET | Freq: Four times a day (QID) | ORAL | 0 refills | Status: AC | PRN

## 2021-11-11 MED ORDER — METHOCARBAMOL 500 MG PO TABS
500.0000 mg | ORAL_TABLET | Freq: Four times a day (QID) | ORAL | 0 refills | Status: AC | PRN
Start: 2021-11-11 — End: ?

## 2021-11-11 MED ORDER — ONDANSETRON HCL 4 MG PO TABS: 4.0000 mg | ORAL_TABLET | Freq: Four times a day (QID) | ORAL | 0 refills | Status: AC | PRN

## 2021-11-11 MED ORDER — ASPIRIN 325 MG PO TBEC: 325.0000 mg | DELAYED_RELEASE_TABLET | Freq: Two times a day (BID) | ORAL | 0 refills | Status: AC

## 2021-11-11 MED ORDER — GABAPENTIN 300 MG PO CAPS: 300.0000 mg | ORAL_CAPSULE | ORAL | 0 refills | Status: AC

## 2021-11-11 MED ORDER — HYDROMORPHONE HCL 2 MG PO TABS: 2.0000 mg | ORAL_TABLET | Freq: Four times a day (QID) | ORAL | 0 refills | Status: AC | PRN

## 2021-11-11 NOTE — Progress Notes (Signed)
Discharge instructions discussed with patient and family, verbalized agreement and understanding 

## 2021-11-11 NOTE — TOC Transition Note (Signed)
Transition of Care (TOC) - CM/SW Discharge Note ? ? ?Patient Details  ?Name: Kelly Castaneda ?MRN: [id number] ?Date of Birth: [birth year] ? ?Transition of Care (TOC) CM/SW Contact:  ?Arron Mcnaught, LCSW ?Phone Number: ?11/11/2021, 10:47 AM ? ? ?Clinical Narrative:    ? ?Met briefly with pt and confirming receipt of rw via [place].  Plan for OPPT at [facility] in [place].  No TOC needs. ? ?Final next level of care: OP Rehab ?Barriers to Discharge: No Barriers Identified ? ? ?Patient Goals and CMS Choice ?Patient states their goals for this hospitalization and ongoing recovery are:: return home ?  ?  ? ?Discharge Placement ?  ?           ?  ?  ?  ?  ? ?Discharge Plan and Services ?  ?  ?           ?DME Arranged: Walker rolling ?DME Agency: [employer] ?  ?  ?  ?  ?  ?  ?  ?  ? ?Social Determinants of Health (SDOH) Interventions ?  ? ? ?Readmission Risk Interventions ?   ? View : No data to display.  ?  ?  ?  ? ? ? ? ? ?

## 2021-11-11 NOTE — Progress Notes (Signed)
Physical Therapy Treatment ?Patient Details ?Name: Kelly Castaneda ?MRN: [id number] ?DOB: Nov 27, 1950 ?Today's Date: 11/11/2021 ? ? ?History of Present Illness Pt is a 71yo female presenting s/p R-TKA on 11/10/21. PMH: Bell's palsy, OA, CHF, mobitz II second degree heart block with pacemaker, HTN, GERD, hypothyroidism, migraine, L-TKA 2015, lumbar surgery. ? ?  ?PT Comments  ? ? Pt seen on POD1 s/p the procedure above. Pt min guard for all mobility tasks today including ambulation in hallway with RW 18ft, no physical assist required. Pt in KI throughout except during performance of HEP; pt demonstrated good form with verbal cueing. Educated provided regarding wear of KI and use of RW, pt verbalized understanding. Feel pt is progressing well and is ready for discharge from therapy standpoint. We will continue to follow her acutely to promote independence with functional mobility.  ?  ?Recommendations for follow up therapy are one component of a multi-disciplinary discharge planning process, led by the attending physician.  Recommendations may be updated based on patient status, additional functional criteria and insurance authorization. ? ?Follow Up Recommendations ? Follow physician's recommendations for discharge plan and follow up therapies ?  ?  ?Assistance Recommended at Discharge Set up Supervision/Assistance  ?Patient can return home with the following A little help with walking and/or transfers;A little help with bathing/dressing/bathroom;Assistance with cooking/housework;Assist for transportation;Help with stairs or ramp for entrance ?  ?Equipment Recommendations ? Rolling walker (2 wheels)  ?  ?Recommendations for Other Services   ? ? ?  ?Precautions / Restrictions Precautions ?Precautions: Fall ?Required Braces or Orthoses: Knee Immobilizer - Right ?Knee Immobilizer - Right: On when out of bed or walking ?Restrictions ?Weight Bearing Restrictions: No  ?  ? ?Mobility ? Bed Mobility ?Overal bed mobility: Needs  Assistance ?Bed Mobility: Supine to Sit, Sit to Supine ?  ?  ?Supine to sit: Min guard ?Sit to supine: Min guard ?  ?General bed mobility comments: Pt min guard for safety and line management only, no physical assist required. ?  ? ?Transfers ?Overall transfer level: Needs assistance ?Equipment used: Rolling walker (2 wheels) ?Transfers: Sit to/from Stand ?Sit to Stand: Min guard ?  ?  ?  ?  ?  ?General transfer comment: Pt min guard for safety and line management only, no physical assist required; VCs for sequencing and hand placement. ?  ? ?Ambulation/Gait ?Ambulation/Gait assistance: Min guard ?Gait Distance (Feet): 90 Feet ?Assistive device: Rolling walker (2 wheels) ?Gait Pattern/deviations: Step-to pattern, Decreased step length - right, Decreased stance time - right ?  ?  ?  ?General Gait Details: Pt ambulated 59ft with min guard for safety only, no physical assist required. Pt wearing KI, demonstrated decreased step length and stance time on right consistent with s/p R-TKA. No overt LOB noted. ? ? ?Stairs ?  ?  ?  ?  ?  ? ? ?Wheelchair Mobility ?  ? ?Modified Rankin (Stroke Patients Only) ?  ? ? ?  ?Balance Overall balance assessment: Needs assistance ?Sitting-balance support: Feet unsupported, Single extremity supported ?Sitting balance-Leahy Scale: Fair ?  ?  ?Standing balance support: Bilateral upper extremity supported, During functional activity, Reliant on assistive device for balance ?Standing balance-Leahy Scale: Poor ?Standing balance comment: Pt reliant on BUE support on RW, but unable to bear weight fully through LUE. ?  ?  ?  ?  ?  ?  ?  ?  ?  ?  ?  ?  ? ?  ?Cognition Arousal/Alertness: Awake/alert ?Behavior During Therapy: [facility] for tasks assessed/performed ?  Overall Cognitive Status: Within Functional Limits for tasks assessed ?  ?  ?  ?  ?  ?  ?  ?  ?  ?  ?  ?  ?  ?  ?  ?  ?  ?  ?  ? ?  ?Exercises Total Joint Exercises ?Ankle Circles/Pumps: AROM, 20 reps, Both ?Quad Sets: AROM, Right, 5  reps ?Short Arc Quad: AROM, Right, 5 reps ?Heel Slides: AAROM, Right, 5 reps ?Hip ABduction/ADduction: Right, 5 reps, AAROM ?Straight Leg Raises: AAROM, Right, 10 reps ? ?  ?General Comments   ?  ?  ? ?Pertinent Vitals/Pain Pain Assessment ?Pain Assessment: No/denies pain  ? ? ?Home Living   ?  ?  ?  ?  ?  ?  ?  ?  ?  ?   ?  ?Prior Function    ?  ?  ?   ? ?PT Goals (current goals can now be found in the care plan section) Acute Rehab PT Goals ?Patient Stated Goal: Get back to walking ?PT Goal Formulation: With patient ?Time For Goal Achievement: 11/17/21 ?Potential to Achieve Goals: Good ?Progress towards PT goals: Progressing toward goals ? ?  ?Frequency ? ? ? 7X/week ? ? ? ?  ?PT Plan Current plan remains appropriate  ? ? ?Co-evaluation   ?  ?  ?  ?  ? ?  ?AM-PAC PT "6 Clicks" Mobility   ?Outcome Measure ? Help needed turning from your back to your side while in a flat bed without using bedrails?: A Little ?Help needed moving from lying on your back to sitting on the side of a flat bed without using bedrails?: A Little ?Help needed moving to and from a bed to a chair (including a wheelchair)?: A Little ?Help needed standing up from a chair using your arms (e.g., wheelchair or bedside chair)?: A Little ?Help needed to walk in hospital room?: A Little ?Help needed climbing 3-5 steps with a railing? : A Little ?6 Click Score: 18 ? ?  ?End of Session Equipment Utilized During Treatment: Gait belt;Right knee immobilizer ?Activity Tolerance: Patient tolerated treatment well ?Patient left: in chair;with call bell/phone within reach;with chair alarm set;with family/visitor present ?  ?PT Visit Diagnosis: Pain;Difficulty in walking, not elsewhere classified (R26.2) ?Pain - Right/Left: Right ?Pain - part of body: Knee ?  ? ? ?Time: 1751-0258 ?PT Time Calculation (min) (ACUTE ONLY): 33 min ? ?Charges:  $Gait Training: 8-22 mins ?$Therapeutic Exercise: 8-22 mins          ?          ?Jamesetta Geralds, PT, DPT ?[facility]  Rehabilitation Department ?Office: [phone] ?Pager: [phone] ? ? ?Jamesetta Geralds ?11/11/2021, 11:21 AM ? ?

## 2021-11-11 NOTE — Progress Notes (Addendum)
? ?Subjective: ?1 Day Post-Op Procedure(s) (LRB): ?TOTAL KNEE ARTHROPLASTY (Right) ?Patient seen in rounds by Dr. Lequita Halt. ?Patient is well, and has had no acute complaints or problems. Foley cath removed this AM. Denies SOB or chest pain. ?Patient reports pain as mild.  Worked with physical therapy yesterday and was able to ambulate 3 feet. We will continue therapy today. ? ?Objective: ?Vital signs in last 24 hours: ?Temp:  [97.4 ?F (36.3 ?C)-98 ?F (36.7 ?C)] 97.4 ?F (36.3 ?C) (04/11 0615) ?Pulse Rate:  [59-65] 65 (04/11 0615) ?Resp:  [13-19] 18 (04/11 0615) ?BP: (111-191)/(60-95) 143/81 (04/11 0615) ?SpO2:  [90 %-100 %] 94 % (04/11 0615) ?Weight:  [101.1 kg-101.2 kg] 101.1 kg (04/10 1535) ? ?Intake/Output from previous day: ? ?Intake/Output Summary (Last 24 hours) at 11/11/2021 0742 ?Last data filed at 11/11/2021 4496 ?Gross per 24 hour  ?Intake 2879.52 ml  ?Output 2600 ml  ?Net 279.52 ml  ?  ? ?Intake/Output this shift: ?No intake/output data recorded. ? ?Labs: ?Recent Labs  ?  11/11/21 ?0310  ?HGB 11.6*  ? ?Recent Labs  ?  11/11/21 ?0310  ?WBC 9.4  ?RBC 3.56*  ?HCT 34.5*  ?PLT 185  ? ?Recent Labs  ?  11/11/21 ?0310  ?NA 139  ?K 4.2  ?CL 109  ?CO2 25  ?BUN 15  ?CREATININE 0.77  ?GLUCOSE 142*  ?CALCIUM 9.0  ? ?No results for input(s): LABPT, INR in the last 72 hours. ? ?Exam: ?General - Patient is Alert and Oriented ?Extremity - Neurologically intact ?Neurovascular intact ?Sensation intact distally ?Dorsiflexion/Plantar flexion intact ?Dressing - dressing C/D/I ?Motor Function - intact, moving foot and toes well on exam. ? ?Past Medical History:  ?Diagnosis Date  ? Arthritis   ? "wrists, right knee" (11/06/2016)  ? Bell's palsy   ? Chronic diastolic CHF (congestive heart failure) (HCC)   ? a. 11/2015 Echo: EF 50-55%, Gr1 DD, mildly dil LA.  ? Complete heart block (HCC)   ? mobitz II second degree AV  ? Essential hypertension   ? Family history of adverse reaction to anesthesia   ? "had a little trouble waking my  brother up; dx'd sleep apnea"  ? GERD (gastroesophageal reflux disease)   ? H/O hiatal hernia   ? History of kidney stones   ? Hypothyroidism   ? "not on RX" (11/06/2016)  ? Migraine   ? "none in years" (11/06/2016)  ? OSA on CPAP   ? "I wear nose pillows"  ? Presence of permanent cardiac pacemaker   ? Shingles   ? Syncope   ? a. 11/2015 - subsequent event monitoring revealed CHF in setting of beta blocker therapy - resolved with discontinuation of coreg.  ? ? ?Assessment/Plan: ?1 Day Post-Op Procedure(s) (LRB): ?TOTAL KNEE ARTHROPLASTY (Right) ?Principal Problem: ?  Osteoarthritis of right knee ? ?Estimated body mass index is 39.5 kg/m? as calculated from the following: ?  Height as of this encounter: 5\' 3"  (1.6 m). ?  Weight as of this encounter: 101.1 kg. ?Advance diet ?Up with therapy ?D/C IV fluids ? ?Patient's anticipated LOS is less than 2 midnights, meeting these requirements: ?- Lives within 1 hour of care ?- Has a competent adult at home to recover with post-op recover ?- NO history of ? - Chronic pain requiring opiods ? - Diabetes ? - Coronary Artery Disease ? - Heart attack ? - Stroke ? - DVT/VTE ? - Respiratory Failure/COPD ? - Renal failure ? - Anemia ? - Advanced Liver disease ? ?DVT Prophylaxis -  Aspirin ?Weight bearing as tolerated. ?Continue therapy. ? ?Plan is to go Home with family after hospital stay. Expected discharge this afternoon pending patient progress with physical therapy today. She will start outpatient physical therapy at [facility] in [place]. Follow-up in office in 2 weeks. ? ?The PDMP database was reviewed today prior to any opioid medications being prescribed to this patient. ? ?R. Arcola Jansky, PA-C ?Orthopedic Surgery ?[phone] ?11/11/2021, 7:42 AM  ?

## 2021-11-12 NOTE — Discharge Summary (Signed)
Physician Discharge Summary  ? ?Patient ID: ?[facility] ?MRN: [id number] ?DOB/AGE: [birth year] 73 y.o. ? ?Admit date: 11/10/2021 ?Discharge date: 11/11/2021 ? ?Primary Diagnosis: osteoarthritis, right knee  ? ?Admission Diagnoses:  ?Past Medical History:  ?Diagnosis Date  ? Arthritis   ? "wrists, right knee" (11/06/2016)  ? Bell's palsy   ? Chronic diastolic CHF (congestive heart failure) (HCC)   ? a. 11/2015 Echo: EF 50-55%, Gr1 DD, mildly dil LA.  ? Complete heart block (HCC)   ? mobitz II second degree AV  ? Essential hypertension   ? Family history of adverse reaction to anesthesia   ? "had a little trouble waking my brother up; dx'd sleep apnea"  ? GERD (gastroesophageal reflux disease)   ? H/O hiatal hernia   ? History of kidney stones   ? Hypothyroidism   ? "not on RX" (11/06/2016)  ? Migraine   ? "none in years" (11/06/2016)  ? OSA on CPAP   ? "I wear nose pillows"  ? Presence of permanent cardiac pacemaker   ? Shingles   ? Syncope   ? a. 11/2015 - subsequent event monitoring revealed CHF in setting of beta blocker therapy - resolved with discontinuation of coreg.  ? ?Discharge Diagnoses:   ?Principal Problem: ?  Osteoarthritis of right knee ? ?Estimated body mass index is 39.5 kg/m? as calculated from the following: ?  Height as of this encounter: 5\' 3"  (1.6 m). ?  Weight as of this encounter: 101.1 kg. ? ?Procedure:  ?Procedure(s) (LRB): ?TOTAL KNEE ARTHROPLASTY (Right)  ? ?Consults: None ? ?HPI: Haileigh Pitz is a 71 y.o. year old female with end stage OA of her right knee with progressively worsening pain and dysfunction. She has constant pain, with activity and at rest and significant functional deficits with difficulties even with ADLs. She has had extensive non-op management including analgesics, injections of cortisone and viscosupplements, and home exercise program, but remains in significant pain with significant dysfunction.Radiographs show bone on bone arthritis Medial and patellofemoral. She  presents now for right Total Knee Arthroplasty. ? ?Laboratory Data: ?Admission on 11/10/2021, Discharged on 11/11/2021  ?Component Date Value Ref Range Status  ? WBC 11/11/2021 9.4  4.0 - 10.5 K/uL Final  ? RBC 11/11/2021 3.56 (L)  3.87 - 5.11 MIL/uL Final  ? Hemoglobin 11/11/2021 11.6 (L)  12.0 - 15.0 g/dL Final  ? HCT 01/11/2022 34.5 (L)  36.0 - 46.0 % Final  ? MCV 11/11/2021 96.9  80.0 - 100.0 fL Final  ? MCH 11/11/2021 32.6  26.0 - 34.0 pg Final  ? MCHC 11/11/2021 33.6  30.0 - 36.0 g/dL Final  ? RDW 01/11/2022 12.5  11.5 - 15.5 % Final  ? Platelets 11/11/2021 185  150 - 400 K/uL Final  ? nRBC 11/11/2021 0.0  0.0 - 0.2 % Final  ? Performed at [facility], [address] [place], [place]  ? Sodium 11/11/2021 139  135 - 145 mmol/L Final  ? Potassium 11/11/2021 4.2  3.5 - 5.1 mmol/L Final  ? Chloride 11/11/2021 109  98 - 111 mmol/L Final  ? CO2 11/11/2021 25  22 - 32 mmol/L Final  ? Glucose, Bld 11/11/2021 142 (H)  70 - 99 mg/dL Final  ? Glucose reference range applies only to samples taken after fasting for at least 8 hours.  ? BUN 11/11/2021 15  8 - 23 mg/dL Final  ? Creatinine, Ser 11/11/2021 0.77  0.44 - 1.00 mg/dL Final  ? Calcium 01/11/2022  9.0  8.9 - 10.3 mg/dL Final  ? GFR, Estimated 11/11/2021 >60  >60 mL/min Final  ? Comment: (NOTE) ?Calculated using the CKD-EPI Creatinine Equation (2021) ?  ? Anion gap 11/11/2021 5  5 - 15 Final  ? Performed at [facility], [address] [place], [place] [address]  ?Hospital Outpatient Visit on 10/28/2021  ?Component Date Value Ref Range Status  ? MRSA, PCR 10/28/2021 NEGATIVE  NEGATIVE Final  ? Staphylococcus aureus 10/28/2021 NEGATIVE  NEGATIVE Final  ? Comment: (NOTE) ?The Xpert SA Assay (FDA approved for NASAL specimens in patients 52 ?years of age and older), is one component of a comprehensive ?surveillance program. It is not intended to diagnose infection nor to ?guide or monitor treatment. ?Performed at [facility], [address] ?[place], [place] [address] ?  ? WBC 10/28/2021 5.9  4.0 - 10.5 K/uL Final  ? RBC 10/28/2021 4.35  3.87 - 5.11 MIL/uL Final  ? Hemoglobin 10/28/2021 13.9  12.0 - 15.0 g/dL Final  ? HCT 25/42/7062 42.9  36.0 - 46.0 % Final  ? MCV 10/28/2021 98.6  80.0 - 100.0 fL Final  ? MCH 10/28/2021 32.0  26.0 - 34.0 pg Final  ? MCHC 10/28/2021 32.4  30.0 - 36.0 g/dL Final  ? RDW 37/62/8315 12.9  11.5 - 15.5 % Final  ? Platelets 10/28/2021 214  150 - 400 K/uL Final  ? nRBC 10/28/2021 0.0  0.0 - 0.2 % Final  ? Performed at [facility], [address] [place], [place] [address]  ? Sodium 10/28/2021 138  135 - 145 mmol/L Final  ? Potassium 10/28/2021 4.3  3.5 - 5.1 mmol/L Final  ? Chloride 10/28/2021 107  98 - 111 mmol/L Final  ? CO2 10/28/2021 25  22 - 32 mmol/L Final  ? Glucose, Bld 10/28/2021 121 (H)  70 - 99 mg/dL Final  ? Glucose reference range applies only to samples taken after fasting for at least 8 hours.  ? BUN 10/28/2021 36 (H)  8 - 23 mg/dL Final  ? Creatinine, Ser 10/28/2021 1.26 (H)  0.44 - 1.00 mg/dL Final  ? Calcium 07/37/1062 10.1  8.9 - 10.3 mg/dL Final  ? Total Protein 10/28/2021 8.0  6.5 - 8.1 g/dL Final  ? Albumin 69/48/5462 4.1  3.5 - 5.0 g/dL Final  ? AST 70/35/0093 14 (L)  15 - 41 U/L Final  ? ALT 10/28/2021 11  0 - 44 U/L Final  ? Alkaline Phosphatase 10/28/2021 55  38 - 126 U/L Final  ? Total Bilirubin 10/28/2021 0.4  0.3 - 1.2 mg/dL Final  ? GFR, Estimated 10/28/2021 45 (L)  >60 mL/min Final  ? Comment: (NOTE) ?Calculated using the CKD-EPI Creatinine Equation (2021) ?  ? Anion gap 10/28/2021 6  5 - 15 Final  ? Performed at [facility], [address] [place], [place] [address]  ?  ? ?X-Rays:No results found. ? ?EKG: ?Orders placed or performed in visit on 09/03/21  ? EKG 12-Lead  ?  ? ?Hospital Course: Shaquoya Cosper Teigen is a 71 y.o. who was admitted to [facility]. They were brought to the operating room on  11/10/2021 and underwent Procedure(s): ?TOTAL KNEE ARTHROPLASTY.  Patient tolerated the procedure well and was later transferred to the recovery room and then to the orthopaedic floor for postoperative care. They were given PO and IV analgesics for pain control following their surgery. They were given 24 hours of postoperative antibiotics of  ?  Anti-infectives (From admission, onward)  ? ? Start     Dose/Rate Route Frequency Ordered Stop  ? 11/10/21 1800  ceFAZolin (ANCEF) IVPB 2g/100 mL premix       ? 2 g ?200 mL/hr over 30 Minutes Intravenous Every 6 hours 11/10/21 1538 11/11/21 1231  ? 11/10/21 1045  ceFAZolin (ANCEF) IVPB 2g/100 mL premix       ? 2 g ?200 mL/hr over 30 Minutes Intravenous On call to O.R. 11/10/21 1035 11/10/21 1227  ? ?  ? and started on DVT prophylaxis in the form of Aspirin.   PT and OT were ordered for total joint protocol. Discharge planning consulted to help with postop disposition and equipment needs.  Patient had a good night on the evening of surgery. They started to get up OOB with therapy on POD #0. Pt was seen during rounds and was ready to go home pending progress with therapy. She worked with therapy on POD #1 and was meeting her goals. Pt was discharged to home later that day in stable condition. ? ?Diet: Regular diet ?Activity: WBAT ?Follow-up: in 2 weeks ?Disposition: Home ?Discharged Condition: stable ? ? ?Discharge Instructions   ? ? Call MD / Call 911   Complete by: As directed ?  ? If you experience chest pain or shortness of breath, CALL 911 and be transported to the hospital emergency room.  If you develope a fever above 101 F, pus (white drainage) or increased drainage or redness at the wound, or calf pain, call your surgeon's office.  ? Change dressing   Complete by: As directed ?  ? You may remove the bulky bandage (ACE wrap and gauze) two days after surgery. You will have an adhesive waterproof bandage underneath. Leave this in place until your first follow-up  appointment.  ? Constipation Prevention   Complete by: As directed ?  ? Drink plenty of fluids.  Prune juice may be helpful.  You may use a stool softener, such as Colace (over the counter) 100 mg twice a day.  Use MiraLa

## 2021-11-13 ENCOUNTER — Telehealth: Payer: Self-pay | Admitting: Physician Assistant

## 2021-11-13 DIAGNOSIS — R2689 Other abnormalities of gait and mobility: Secondary | ICD-10-CM | POA: Diagnosis not present

## 2021-11-13 DIAGNOSIS — Z4789 Encounter for other orthopedic aftercare: Secondary | ICD-10-CM | POA: Diagnosis not present

## 2021-11-13 DIAGNOSIS — R531 Weakness: Secondary | ICD-10-CM | POA: Diagnosis not present

## 2021-11-13 DIAGNOSIS — M25561 Pain in right knee: Secondary | ICD-10-CM | POA: Diagnosis not present

## 2021-11-13 NOTE — Telephone Encounter (Signed)
New Message: ? ? ? ?Patient wanted you to know she is in Rehab for 3 weeks and will not be able to transmit. ?

## 2021-11-18 DIAGNOSIS — R2689 Other abnormalities of gait and mobility: Secondary | ICD-10-CM | POA: Diagnosis not present

## 2021-11-18 DIAGNOSIS — M25661 Stiffness of right knee, not elsewhere classified: Secondary | ICD-10-CM | POA: Diagnosis not present

## 2021-11-18 DIAGNOSIS — Z4789 Encounter for other orthopedic aftercare: Secondary | ICD-10-CM | POA: Diagnosis not present

## 2021-11-18 DIAGNOSIS — M25561 Pain in right knee: Secondary | ICD-10-CM | POA: Diagnosis not present

## 2021-11-18 DIAGNOSIS — R531 Weakness: Secondary | ICD-10-CM | POA: Diagnosis not present

## 2021-11-20 DIAGNOSIS — M25661 Stiffness of right knee, not elsewhere classified: Secondary | ICD-10-CM | POA: Diagnosis not present

## 2021-11-20 DIAGNOSIS — Z4789 Encounter for other orthopedic aftercare: Secondary | ICD-10-CM | POA: Diagnosis not present

## 2021-11-20 DIAGNOSIS — M25561 Pain in right knee: Secondary | ICD-10-CM | POA: Diagnosis not present

## 2021-11-20 DIAGNOSIS — R531 Weakness: Secondary | ICD-10-CM | POA: Diagnosis not present

## 2021-11-20 DIAGNOSIS — R2689 Other abnormalities of gait and mobility: Secondary | ICD-10-CM | POA: Diagnosis not present

## 2021-11-23 DIAGNOSIS — G4733 Obstructive sleep apnea (adult) (pediatric): Secondary | ICD-10-CM | POA: Diagnosis not present

## 2021-11-24 DIAGNOSIS — M25561 Pain in right knee: Secondary | ICD-10-CM | POA: Diagnosis not present

## 2021-11-24 DIAGNOSIS — Z4789 Encounter for other orthopedic aftercare: Secondary | ICD-10-CM | POA: Diagnosis not present

## 2021-11-24 DIAGNOSIS — R2689 Other abnormalities of gait and mobility: Secondary | ICD-10-CM | POA: Diagnosis not present

## 2021-11-24 DIAGNOSIS — R531 Weakness: Secondary | ICD-10-CM | POA: Diagnosis not present

## 2021-11-24 DIAGNOSIS — M25661 Stiffness of right knee, not elsewhere classified: Secondary | ICD-10-CM | POA: Diagnosis not present

## 2021-11-26 DIAGNOSIS — R531 Weakness: Secondary | ICD-10-CM | POA: Diagnosis not present

## 2021-11-26 DIAGNOSIS — M25561 Pain in right knee: Secondary | ICD-10-CM | POA: Diagnosis not present

## 2021-11-26 DIAGNOSIS — Z4789 Encounter for other orthopedic aftercare: Secondary | ICD-10-CM | POA: Diagnosis not present

## 2021-11-26 DIAGNOSIS — R2689 Other abnormalities of gait and mobility: Secondary | ICD-10-CM | POA: Diagnosis not present

## 2021-11-26 DIAGNOSIS — M25661 Stiffness of right knee, not elsewhere classified: Secondary | ICD-10-CM | POA: Diagnosis not present

## 2021-11-28 DIAGNOSIS — R531 Weakness: Secondary | ICD-10-CM | POA: Diagnosis not present

## 2021-11-28 DIAGNOSIS — Z4789 Encounter for other orthopedic aftercare: Secondary | ICD-10-CM | POA: Diagnosis not present

## 2021-11-28 DIAGNOSIS — R2689 Other abnormalities of gait and mobility: Secondary | ICD-10-CM | POA: Diagnosis not present

## 2021-11-28 DIAGNOSIS — M25661 Stiffness of right knee, not elsewhere classified: Secondary | ICD-10-CM | POA: Diagnosis not present

## 2021-11-28 DIAGNOSIS — M25561 Pain in right knee: Secondary | ICD-10-CM | POA: Diagnosis not present

## 2021-12-01 DIAGNOSIS — Z4789 Encounter for other orthopedic aftercare: Secondary | ICD-10-CM | POA: Diagnosis not present

## 2021-12-01 DIAGNOSIS — M25561 Pain in right knee: Secondary | ICD-10-CM | POA: Diagnosis not present

## 2021-12-01 DIAGNOSIS — R2689 Other abnormalities of gait and mobility: Secondary | ICD-10-CM | POA: Diagnosis not present

## 2021-12-01 DIAGNOSIS — R531 Weakness: Secondary | ICD-10-CM | POA: Diagnosis not present

## 2021-12-01 DIAGNOSIS — M25661 Stiffness of right knee, not elsewhere classified: Secondary | ICD-10-CM | POA: Diagnosis not present

## 2021-12-03 DIAGNOSIS — R2689 Other abnormalities of gait and mobility: Secondary | ICD-10-CM | POA: Diagnosis not present

## 2021-12-03 DIAGNOSIS — Z4789 Encounter for other orthopedic aftercare: Secondary | ICD-10-CM | POA: Diagnosis not present

## 2021-12-03 DIAGNOSIS — M25661 Stiffness of right knee, not elsewhere classified: Secondary | ICD-10-CM | POA: Diagnosis not present

## 2021-12-03 DIAGNOSIS — R531 Weakness: Secondary | ICD-10-CM | POA: Diagnosis not present

## 2021-12-03 DIAGNOSIS — M25561 Pain in right knee: Secondary | ICD-10-CM | POA: Diagnosis not present

## 2021-12-08 DIAGNOSIS — M25661 Stiffness of right knee, not elsewhere classified: Secondary | ICD-10-CM | POA: Diagnosis not present

## 2021-12-08 DIAGNOSIS — R531 Weakness: Secondary | ICD-10-CM | POA: Diagnosis not present

## 2021-12-08 DIAGNOSIS — R2689 Other abnormalities of gait and mobility: Secondary | ICD-10-CM | POA: Diagnosis not present

## 2021-12-08 DIAGNOSIS — M25561 Pain in right knee: Secondary | ICD-10-CM | POA: Diagnosis not present

## 2021-12-08 DIAGNOSIS — Z4789 Encounter for other orthopedic aftercare: Secondary | ICD-10-CM | POA: Diagnosis not present

## 2021-12-10 DIAGNOSIS — M25561 Pain in right knee: Secondary | ICD-10-CM | POA: Diagnosis not present

## 2021-12-10 DIAGNOSIS — M25661 Stiffness of right knee, not elsewhere classified: Secondary | ICD-10-CM | POA: Diagnosis not present

## 2021-12-10 DIAGNOSIS — Z4789 Encounter for other orthopedic aftercare: Secondary | ICD-10-CM | POA: Diagnosis not present

## 2021-12-10 DIAGNOSIS — R531 Weakness: Secondary | ICD-10-CM | POA: Diagnosis not present

## 2021-12-10 DIAGNOSIS — R2689 Other abnormalities of gait and mobility: Secondary | ICD-10-CM | POA: Diagnosis not present

## 2021-12-12 DIAGNOSIS — M25561 Pain in right knee: Secondary | ICD-10-CM | POA: Diagnosis not present

## 2021-12-12 DIAGNOSIS — R2689 Other abnormalities of gait and mobility: Secondary | ICD-10-CM | POA: Diagnosis not present

## 2021-12-12 DIAGNOSIS — Z4789 Encounter for other orthopedic aftercare: Secondary | ICD-10-CM | POA: Diagnosis not present

## 2021-12-12 DIAGNOSIS — M25661 Stiffness of right knee, not elsewhere classified: Secondary | ICD-10-CM | POA: Diagnosis not present

## 2021-12-12 DIAGNOSIS — R531 Weakness: Secondary | ICD-10-CM | POA: Diagnosis not present

## 2021-12-16 DIAGNOSIS — Z5189 Encounter for other specified aftercare: Secondary | ICD-10-CM | POA: Diagnosis not present

## 2022-02-09 ENCOUNTER — Other Ambulatory Visit: Payer: Self-pay | Admitting: Internal Medicine

## 2022-02-09 DIAGNOSIS — Z1231 Encounter for screening mammogram for malignant neoplasm of breast: Secondary | ICD-10-CM

## 2022-02-11 ENCOUNTER — Ambulatory Visit (INDEPENDENT_AMBULATORY_CARE_PROVIDER_SITE_OTHER): Payer: Medicare HMO

## 2022-02-11 DIAGNOSIS — I441 Atrioventricular block, second degree: Secondary | ICD-10-CM

## 2022-02-16 LAB — CUP PACEART REMOTE DEVICE CHECK
Battery Remaining Longevity: 42 mo
Battery Remaining Percentage: 38 %
Battery Voltage: 2.96 V
Brady Statistic AP VP Percent: 57 %
Brady Statistic AP VS Percent: 1 %
Brady Statistic AS VP Percent: 43 %
Brady Statistic AS VS Percent: 1 %
Brady Statistic RA Percent Paced: 56 %
Brady Statistic RV Percent Paced: 99 %
Date Time Interrogation Session: 20230712020017
Implantable Lead Implant Date: 20190121
Implantable Lead Implant Date: 20190121
Implantable Lead Location: 753859
Implantable Lead Location: 753860
Implantable Pulse Generator Implant Date: 20170928
Lead Channel Impedance Value: 440 Ohm
Lead Channel Impedance Value: 460 Ohm
Lead Channel Pacing Threshold Amplitude: 0.375 V
Lead Channel Pacing Threshold Amplitude: 1 V
Lead Channel Pacing Threshold Pulse Width: 0.5 ms
Lead Channel Pacing Threshold Pulse Width: 0.5 ms
Lead Channel Sensing Intrinsic Amplitude: 0.9 mV
Lead Channel Sensing Intrinsic Amplitude: 12 mV
Lead Channel Setting Pacing Amplitude: 1.25 V
Lead Channel Setting Pacing Amplitude: 2 V
Lead Channel Setting Pacing Pulse Width: 0.5 ms
Lead Channel Setting Sensing Sensitivity: 4 mV
Pulse Gen Model: 2272
Pulse Gen Serial Number: 7950948

## 2022-03-04 NOTE — Progress Notes (Signed)
Remote pacemaker transmission.   

## 2022-04-22 ENCOUNTER — Ambulatory Visit
Admission: RE | Admit: 2022-04-22 | Discharge: 2022-04-22 | Disposition: A | Discharge status: Home / Self Care | Payer: Medicare HMO | Source: Ambulatory Visit | Attending: Internal Medicine | Admitting: Internal Medicine

## 2022-04-22 DIAGNOSIS — Z1231 Encounter for screening mammogram for malignant neoplasm of breast: Secondary | ICD-10-CM

## 2022-04-28 DIAGNOSIS — M720 Palmar fascial fibromatosis [Dupuytren]: Secondary | ICD-10-CM | POA: Diagnosis not present

## 2022-04-28 DIAGNOSIS — M79641 Pain in right hand: Secondary | ICD-10-CM | POA: Diagnosis not present

## 2022-04-30 DIAGNOSIS — G43009 Migraine without aura, not intractable, without status migrainosus: Secondary | ICD-10-CM | POA: Diagnosis not present

## 2022-04-30 DIAGNOSIS — Z0000 Encounter for general adult medical examination without abnormal findings: Secondary | ICD-10-CM | POA: Diagnosis not present

## 2022-04-30 DIAGNOSIS — I10 Essential (primary) hypertension: Secondary | ICD-10-CM | POA: Diagnosis not present

## 2022-04-30 DIAGNOSIS — Z23 Encounter for immunization: Secondary | ICD-10-CM | POA: Diagnosis not present

## 2022-04-30 DIAGNOSIS — Z6841 Body Mass Index (BMI) 40.0 and over, adult: Secondary | ICD-10-CM | POA: Diagnosis not present

## 2022-04-30 DIAGNOSIS — I420 Dilated cardiomyopathy: Secondary | ICD-10-CM | POA: Diagnosis not present

## 2022-04-30 DIAGNOSIS — E559 Vitamin D deficiency, unspecified: Secondary | ICD-10-CM | POA: Diagnosis not present

## 2022-04-30 DIAGNOSIS — E039 Hypothyroidism, unspecified: Secondary | ICD-10-CM | POA: Diagnosis not present

## 2022-05-13 ENCOUNTER — Ambulatory Visit (INDEPENDENT_AMBULATORY_CARE_PROVIDER_SITE_OTHER): Payer: Medicare HMO

## 2022-05-13 DIAGNOSIS — I441 Atrioventricular block, second degree: Secondary | ICD-10-CM

## 2022-05-14 DIAGNOSIS — H179 Unspecified corneal scar and opacity: Secondary | ICD-10-CM | POA: Diagnosis not present

## 2022-05-14 DIAGNOSIS — H43813 Vitreous degeneration, bilateral: Secondary | ICD-10-CM | POA: Diagnosis not present

## 2022-05-14 LAB — CUP PACEART REMOTE DEVICE CHECK
Battery Remaining Longevity: 38 mo
Battery Remaining Percentage: 35 %
Battery Voltage: 2.96 V
Brady Statistic AP VP Percent: 60 %
Brady Statistic AP VS Percent: 1 %
Brady Statistic AS VP Percent: 39 %
Brady Statistic AS VS Percent: 1 %
Brady Statistic RA Percent Paced: 59 %
Brady Statistic RV Percent Paced: 99 %
Date Time Interrogation Session: 20231011083016
Implantable Lead Implant Date: 20190121
Implantable Lead Implant Date: 20190121
Implantable Lead Location: 753859
Implantable Lead Location: 753860
Implantable Pulse Generator Implant Date: 20170928
Lead Channel Impedance Value: 440 Ohm
Lead Channel Impedance Value: 440 Ohm
Lead Channel Pacing Threshold Amplitude: 1.125 V
Lead Channel Pacing Threshold Amplitude: 1.125 V
Lead Channel Pacing Threshold Pulse Width: 0.5 ms
Lead Channel Pacing Threshold Pulse Width: 0.5 ms
Lead Channel Sensing Intrinsic Amplitude: 0.7 mV
Lead Channel Sensing Intrinsic Amplitude: 12 mV
Lead Channel Setting Pacing Amplitude: 1.375
Lead Channel Setting Pacing Amplitude: 2 V
Lead Channel Setting Pacing Pulse Width: 0.5 ms
Lead Channel Setting Sensing Sensitivity: 4 mV
Pulse Gen Model: 2272
Pulse Gen Serial Number: 7950948

## 2022-05-27 NOTE — Progress Notes (Signed)
Remote pacemaker transmission.   

## 2022-06-16 DIAGNOSIS — M720 Palmar fascial fibromatosis [Dupuytren]: Secondary | ICD-10-CM | POA: Diagnosis not present

## 2022-07-13 DIAGNOSIS — I420 Dilated cardiomyopathy: Secondary | ICD-10-CM | POA: Diagnosis not present

## 2022-07-13 DIAGNOSIS — J40 Bronchitis, not specified as acute or chronic: Secondary | ICD-10-CM | POA: Diagnosis not present

## 2022-08-12 ENCOUNTER — Ambulatory Visit (INDEPENDENT_AMBULATORY_CARE_PROVIDER_SITE_OTHER): Payer: Medicare HMO

## 2022-08-12 DIAGNOSIS — I441 Atrioventricular block, second degree: Secondary | ICD-10-CM
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
## 2022-08-14 LAB — CUP PACEART REMOTE DEVICE CHECK
Battery Remaining Longevity: 35 mo
Battery Remaining Percentage: 32 %
Battery Voltage: 2.95 V
Brady Statistic AP VP Percent: 61 %
Brady Statistic AP VS Percent: 1 %
Brady Statistic AS VP Percent: 38 %
Brady Statistic AS VS Percent: 1 %
Brady Statistic RA Percent Paced: 59 %
Brady Statistic RV Percent Paced: 99 %
Date Time Interrogation Session: 20240110173204
Implantable Lead Connection Status: 753985
Implantable Lead Connection Status: 753985
Implantable Lead Implant Date: 20190121
Implantable Lead Implant Date: 20190121
Implantable Lead Location: 753859
Implantable Lead Location: 753860
Implantable Pulse Generator Implant Date: 20170928
Lead Channel Impedance Value: 440 Ohm
Lead Channel Impedance Value: 440 Ohm
Lead Channel Pacing Threshold Amplitude: 0.5 V
Lead Channel Pacing Threshold Amplitude: 1.125 V
Lead Channel Pacing Threshold Pulse Width: 0.5 ms
Lead Channel Pacing Threshold Pulse Width: 0.5 ms
Lead Channel Sensing Intrinsic Amplitude: 12 mV
Lead Channel Sensing Intrinsic Amplitude: 2.3 mV
Lead Channel Setting Pacing Amplitude: 1.375
Lead Channel Setting Pacing Amplitude: 2 V
Lead Channel Setting Pacing Pulse Width: 0.5 ms
Lead Channel Setting Sensing Sensitivity: 4 mV
Pulse Gen Model: 2272
Pulse Gen Serial Number: 7950948

## 2022-09-03 NOTE — Progress Notes (Signed)
Remote pacemaker transmission.   

## 2022-11-11 ENCOUNTER — Ambulatory Visit (INDEPENDENT_AMBULATORY_CARE_PROVIDER_SITE_OTHER): Payer: Medicare HMO

## 2022-11-11 DIAGNOSIS — I441 Atrioventricular block, second degree: Secondary | ICD-10-CM | POA: Diagnosis not present

## 2022-11-11 LAB — CUP PACEART REMOTE DEVICE CHECK
Battery Remaining Longevity: 31 mo
Battery Remaining Percentage: 29 %
Battery Voltage: 2.95 V
Brady Statistic AP VP Percent: 62 %
Brady Statistic AP VS Percent: 1 %
Brady Statistic AS VP Percent: 37 %
Brady Statistic AS VS Percent: 1 %
Brady Statistic RA Percent Paced: 60 %
Brady Statistic RV Percent Paced: 99 %
Date Time Interrogation Session: 20240409210214
Implantable Lead Connection Status: 753985
Implantable Lead Connection Status: 753985
Implantable Lead Implant Date: 20190121
Implantable Lead Implant Date: 20190121
Implantable Lead Location: 753859
Implantable Lead Location: 753860
Implantable Pulse Generator Implant Date: 20170928
Lead Channel Impedance Value: 430 Ohm
Lead Channel Impedance Value: 430 Ohm
Lead Channel Pacing Threshold Amplitude: 0.375 V
Lead Channel Pacing Threshold Amplitude: 1.25 V
Lead Channel Pacing Threshold Pulse Width: 0.5 ms
Lead Channel Pacing Threshold Pulse Width: 0.5 ms
Lead Channel Sensing Intrinsic Amplitude: 1 mV
Lead Channel Sensing Intrinsic Amplitude: 6.8 mV
Lead Channel Setting Pacing Amplitude: 1.5 V
Lead Channel Setting Pacing Amplitude: 2 V
Lead Channel Setting Pacing Pulse Width: 0.5 ms
Lead Channel Setting Sensing Sensitivity: 4 mV
Pulse Gen Model: 2272
Pulse Gen Serial Number: 7950948

## 2022-11-16 DIAGNOSIS — R42 Dizziness and giddiness: Secondary | ICD-10-CM | POA: Diagnosis not present

## 2022-11-16 DIAGNOSIS — Z6841 Body Mass Index (BMI) 40.0 and over, adult: Secondary | ICD-10-CM | POA: Diagnosis not present

## 2022-11-16 DIAGNOSIS — T148XXA Other injury of unspecified body region, initial encounter: Secondary | ICD-10-CM | POA: Diagnosis not present

## 2022-11-25 DIAGNOSIS — G4733 Obstructive sleep apnea (adult) (pediatric): Secondary | ICD-10-CM | POA: Diagnosis not present

## 2022-12-02 DIAGNOSIS — Z0100 Encounter for examination of eyes and vision without abnormal findings: Secondary | ICD-10-CM | POA: Diagnosis not present

## 2022-12-18 NOTE — Progress Notes (Signed)
Remote pacemaker transmission.   

## 2022-12-25 DIAGNOSIS — G4733 Obstructive sleep apnea (adult) (pediatric): Secondary | ICD-10-CM | POA: Diagnosis not present

## 2023-01-25 DIAGNOSIS — G4733 Obstructive sleep apnea (adult) (pediatric): Secondary | ICD-10-CM | POA: Diagnosis not present

## 2023-02-10 ENCOUNTER — Ambulatory Visit (INDEPENDENT_AMBULATORY_CARE_PROVIDER_SITE_OTHER): Payer: Medicare HMO

## 2023-02-10 DIAGNOSIS — I441 Atrioventricular block, second degree: Secondary | ICD-10-CM

## 2023-02-11 LAB — CUP PACEART REMOTE DEVICE CHECK
Battery Remaining Longevity: 28 mo
Battery Remaining Percentage: 26 %
Battery Voltage: 2.93 V
Brady Statistic AP VP Percent: 62 %
Brady Statistic AP VS Percent: 1 %
Brady Statistic AS VP Percent: 37 %
Brady Statistic AS VS Percent: 1 %
Brady Statistic RA Percent Paced: 61 %
Brady Statistic RV Percent Paced: 99 %
Date Time Interrogation Session: 20240710035149
Implantable Lead Connection Status: 753985
Implantable Lead Connection Status: 753985
Implantable Lead Implant Date: 20190121
Implantable Lead Implant Date: 20190121
Implantable Lead Location: 753859
Implantable Lead Location: 753860
Implantable Pulse Generator Implant Date: 20170928
Lead Channel Impedance Value: 410 Ohm
Lead Channel Impedance Value: 430 Ohm
Lead Channel Pacing Threshold Amplitude: 1 V
Lead Channel Pacing Threshold Amplitude: 1.375 V
Lead Channel Pacing Threshold Pulse Width: 0.5 ms
Lead Channel Pacing Threshold Pulse Width: 0.5 ms
Lead Channel Sensing Intrinsic Amplitude: 1.6 mV
Lead Channel Sensing Intrinsic Amplitude: 10.9 mV
Lead Channel Setting Pacing Amplitude: 1.625
Lead Channel Setting Pacing Amplitude: 2 V
Lead Channel Setting Pacing Pulse Width: 0.5 ms
Lead Channel Setting Sensing Sensitivity: 4 mV
Pulse Gen Model: 2272
Pulse Gen Serial Number: 7950948

## 2023-03-04 NOTE — Progress Notes (Signed)
Remote pacemaker transmission.   

## 2023-03-09 ENCOUNTER — Other Ambulatory Visit: Payer: Self-pay | Admitting: Internal Medicine

## 2023-03-09 DIAGNOSIS — Z1231 Encounter for screening mammogram for malignant neoplasm of breast: Secondary | ICD-10-CM

## 2023-04-26 ENCOUNTER — Ambulatory Visit
Admission: RE | Admit: 2023-04-26 | Discharge: 2023-04-26 | Disposition: A | Discharge status: Home / Self Care | Payer: Medicare HMO | Source: Ambulatory Visit | Attending: Internal Medicine | Admitting: Internal Medicine

## 2023-04-26 DIAGNOSIS — Z1231 Encounter for screening mammogram for malignant neoplasm of breast: Secondary | ICD-10-CM

## 2023-05-07 DIAGNOSIS — Z23 Encounter for immunization: Secondary | ICD-10-CM | POA: Diagnosis not present

## 2023-05-07 DIAGNOSIS — Z1389 Encounter for screening for other disorder: Secondary | ICD-10-CM | POA: Diagnosis not present

## 2023-05-07 DIAGNOSIS — Z6841 Body Mass Index (BMI) 40.0 and over, adult: Secondary | ICD-10-CM | POA: Diagnosis not present

## 2023-05-07 DIAGNOSIS — Z1159 Encounter for screening for other viral diseases: Secondary | ICD-10-CM | POA: Diagnosis not present

## 2023-05-07 DIAGNOSIS — Z0000 Encounter for general adult medical examination without abnormal findings: Secondary | ICD-10-CM | POA: Diagnosis not present

## 2023-05-12 ENCOUNTER — Ambulatory Visit (INDEPENDENT_AMBULATORY_CARE_PROVIDER_SITE_OTHER): Payer: Medicare HMO

## 2023-05-12 DIAGNOSIS — I441 Atrioventricular block, second degree: Secondary | ICD-10-CM

## 2023-05-13 LAB — CUP PACEART REMOTE DEVICE CHECK
Battery Remaining Longevity: 24 mo
Battery Remaining Percentage: 23 %
Battery Voltage: 2.93 V
Brady Statistic AP VP Percent: 63 %
Brady Statistic AP VS Percent: 1 %
Brady Statistic AS VP Percent: 36 %
Brady Statistic AS VS Percent: 1 %
Brady Statistic RA Percent Paced: 62 %
Brady Statistic RV Percent Paced: 99 %
Date Time Interrogation Session: 20241009020039
Implantable Lead Connection Status: 753985
Implantable Lead Connection Status: 753985
Implantable Lead Implant Date: 20190121
Implantable Lead Implant Date: 20190121
Implantable Lead Location: 753859
Implantable Lead Location: 753860
Implantable Pulse Generator Implant Date: 20170928
Lead Channel Impedance Value: 430 Ohm
Lead Channel Impedance Value: 430 Ohm
Lead Channel Pacing Threshold Amplitude: 0.375 V
Lead Channel Pacing Threshold Amplitude: 1.375 V
Lead Channel Pacing Threshold Pulse Width: 0.5 ms
Lead Channel Pacing Threshold Pulse Width: 0.5 ms
Lead Channel Sensing Intrinsic Amplitude: 1.5 mV
Lead Channel Sensing Intrinsic Amplitude: 10.3 mV
Lead Channel Setting Pacing Amplitude: 1.625
Lead Channel Setting Pacing Amplitude: 2 V
Lead Channel Setting Pacing Pulse Width: 0.5 ms
Lead Channel Setting Sensing Sensitivity: 4 mV
Pulse Gen Model: 2272
Pulse Gen Serial Number: 7950948

## 2023-06-02 NOTE — Progress Notes (Signed)
Remote pacemaker transmission.   

## 2023-06-08 DIAGNOSIS — H43813 Vitreous degeneration, bilateral: Secondary | ICD-10-CM | POA: Diagnosis not present

## 2023-08-11 ENCOUNTER — Ambulatory Visit: Payer: Medicare HMO

## 2023-08-11 DIAGNOSIS — I441 Atrioventricular block, second degree: Secondary | ICD-10-CM

## 2023-08-12 LAB — CUP PACEART REMOTE DEVICE CHECK
Battery Remaining Longevity: 22 mo
Battery Remaining Percentage: 20 %
Battery Voltage: 2.92 V
Brady Statistic AP VP Percent: 63 %
Brady Statistic AP VS Percent: 1 %
Brady Statistic AS VP Percent: 37 %
Brady Statistic AS VS Percent: 1 %
Brady Statistic RA Percent Paced: 61 %
Brady Statistic RV Percent Paced: 99 %
Date Time Interrogation Session: 20250108020013
Implantable Lead Connection Status: 753985
Implantable Lead Connection Status: 753985
Implantable Lead Implant Date: 20190121
Implantable Lead Implant Date: 20190121
Implantable Lead Location: 753859
Implantable Lead Location: 753860
Implantable Pulse Generator Implant Date: 20170928
Lead Channel Impedance Value: 430 Ohm
Lead Channel Impedance Value: 440 Ohm
Lead Channel Pacing Threshold Amplitude: 1.25 V
Lead Channel Pacing Threshold Amplitude: 1.375 V
Lead Channel Pacing Threshold Pulse Width: 0.5 ms
Lead Channel Pacing Threshold Pulse Width: 0.5 ms
Lead Channel Sensing Intrinsic Amplitude: 1.4 mV
Lead Channel Sensing Intrinsic Amplitude: 12 mV
Lead Channel Setting Pacing Amplitude: 1.5 V
Lead Channel Setting Pacing Amplitude: 2 V
Lead Channel Setting Pacing Pulse Width: 0.5 ms
Lead Channel Setting Sensing Sensitivity: 4 mV
Pulse Gen Model: 2272
Pulse Gen Serial Number: 7950948

## 2023-08-17 DIAGNOSIS — H18459 Nodular corneal degeneration, unspecified eye: Secondary | ICD-10-CM | POA: Diagnosis not present

## 2023-08-17 DIAGNOSIS — H179 Unspecified corneal scar and opacity: Secondary | ICD-10-CM | POA: Diagnosis not present

## 2023-09-07 DIAGNOSIS — G4733 Obstructive sleep apnea (adult) (pediatric): Secondary | ICD-10-CM | POA: Diagnosis not present

## 2023-09-24 NOTE — Progress Notes (Signed)
 Remote pacemaker transmission.

## 2023-09-24 NOTE — Addendum Note (Signed)
Addended by: Elease Etienne A on: 09/24/2023 08:41 AM   Modules accepted: Orders

## 2023-11-10 ENCOUNTER — Ambulatory Visit (INDEPENDENT_AMBULATORY_CARE_PROVIDER_SITE_OTHER): Payer: Medicare HMO

## 2023-11-10 DIAGNOSIS — I441 Atrioventricular block, second degree: Secondary | ICD-10-CM

## 2023-11-29 LAB — CUP PACEART REMOTE DEVICE CHECK
Battery Remaining Longevity: 19 mo
Battery Remaining Percentage: 18 %
Battery Voltage: 2.9 V
Brady Statistic AP VP Percent: 62 %
Brady Statistic AP VS Percent: 1 %
Brady Statistic AS VP Percent: 38 %
Brady Statistic AS VS Percent: 1 %
Brady Statistic RA Percent Paced: 60 %
Brady Statistic RV Percent Paced: 99 %
Date Time Interrogation Session: 20250409034911
Implantable Lead Connection Status: 753985
Implantable Lead Connection Status: 753985
Implantable Lead Implant Date: 20190121
Implantable Lead Implant Date: 20190121
Implantable Lead Location: 753859
Implantable Lead Location: 753860
Implantable Pulse Generator Implant Date: 20170928
Lead Channel Impedance Value: 430 Ohm
Lead Channel Impedance Value: 430 Ohm
Lead Channel Pacing Threshold Amplitude: 0.375 V
Lead Channel Pacing Threshold Amplitude: 1.125 V
Lead Channel Pacing Threshold Pulse Width: 0.5 ms
Lead Channel Pacing Threshold Pulse Width: 0.5 ms
Lead Channel Sensing Intrinsic Amplitude: 1.2 mV
Lead Channel Sensing Intrinsic Amplitude: 10.4 mV
Lead Channel Setting Pacing Amplitude: 1.375
Lead Channel Setting Pacing Amplitude: 2 V
Lead Channel Setting Pacing Pulse Width: 0.5 ms
Lead Channel Setting Sensing Sensitivity: 4 mV
Pulse Gen Model: 2272
Pulse Gen Serial Number: 7950948

## 2023-11-30 ENCOUNTER — Encounter: Payer: Self-pay | Admitting: Cardiovascular Disease

## 2023-12-28 NOTE — Addendum Note (Signed)
 Addended by: Lott Rouleau A on: 12/28/2023 08:52 AM   Modules accepted: Orders

## 2023-12-28 NOTE — Progress Notes (Signed)
 Remote pacemaker transmission.

## 2024-01-01 DIAGNOSIS — I420 Dilated cardiomyopathy: Secondary | ICD-10-CM | POA: Diagnosis not present

## 2024-01-01 DIAGNOSIS — I10 Essential (primary) hypertension: Secondary | ICD-10-CM | POA: Diagnosis not present

## 2024-01-01 DIAGNOSIS — E039 Hypothyroidism, unspecified: Secondary | ICD-10-CM | POA: Diagnosis not present

## 2024-01-31 DIAGNOSIS — E039 Hypothyroidism, unspecified: Secondary | ICD-10-CM | POA: Diagnosis not present

## 2024-01-31 DIAGNOSIS — I420 Dilated cardiomyopathy: Secondary | ICD-10-CM | POA: Diagnosis not present

## 2024-01-31 DIAGNOSIS — I10 Essential (primary) hypertension: Secondary | ICD-10-CM | POA: Diagnosis not present

## 2024-02-09 ENCOUNTER — Ambulatory Visit: Payer: Medicare HMO

## 2024-02-09 DIAGNOSIS — I441 Atrioventricular block, second degree: Secondary | ICD-10-CM | POA: Diagnosis not present

## 2024-02-14 ENCOUNTER — Ambulatory Visit: Payer: Self-pay | Admitting: Cardiovascular Disease

## 2024-02-14 LAB — CUP PACEART REMOTE DEVICE CHECK
Battery Remaining Longevity: 16 mo
Battery Remaining Percentage: 15 %
Battery Voltage: 2.89 V
Brady Statistic AP VP Percent: 63 %
Brady Statistic AP VS Percent: 1 %
Brady Statistic AS VP Percent: 37 %
Brady Statistic AS VS Percent: 1 %
Brady Statistic RA Percent Paced: 61 %
Brady Statistic RV Percent Paced: 99 %
Date Time Interrogation Session: 20250709020020
Implantable Lead Connection Status: 753985
Implantable Lead Connection Status: 753985
Implantable Lead Implant Date: 20190121
Implantable Lead Implant Date: 20190121
Implantable Lead Location: 753859
Implantable Lead Location: 753860
Implantable Pulse Generator Implant Date: 20170928
Lead Channel Impedance Value: 430 Ohm
Lead Channel Impedance Value: 440 Ohm
Lead Channel Pacing Threshold Amplitude: 0.375 V
Lead Channel Pacing Threshold Amplitude: 1.125 V
Lead Channel Pacing Threshold Pulse Width: 0.5 ms
Lead Channel Pacing Threshold Pulse Width: 0.5 ms
Lead Channel Sensing Intrinsic Amplitude: 2.7 mV
Lead Channel Sensing Intrinsic Amplitude: 5.9 mV
Lead Channel Setting Pacing Amplitude: 1.375
Lead Channel Setting Pacing Amplitude: 2 V
Lead Channel Setting Pacing Pulse Width: 0.5 ms
Lead Channel Setting Sensing Sensitivity: 4 mV
Pulse Gen Model: 2272
Pulse Gen Serial Number: 7950948

## 2024-03-02 DIAGNOSIS — E039 Hypothyroidism, unspecified: Secondary | ICD-10-CM | POA: Diagnosis not present

## 2024-03-02 DIAGNOSIS — I10 Essential (primary) hypertension: Secondary | ICD-10-CM | POA: Diagnosis not present

## 2024-03-02 DIAGNOSIS — I420 Dilated cardiomyopathy: Secondary | ICD-10-CM | POA: Diagnosis not present

## 2024-03-13 ENCOUNTER — Other Ambulatory Visit: Payer: Self-pay | Admitting: Internal Medicine

## 2024-03-13 DIAGNOSIS — Z1231 Encounter for screening mammogram for malignant neoplasm of breast: Secondary | ICD-10-CM

## 2024-04-17 DIAGNOSIS — R197 Diarrhea, unspecified: Secondary | ICD-10-CM | POA: Diagnosis not present

## 2024-04-26 ENCOUNTER — Ambulatory Visit

## 2024-04-27 DIAGNOSIS — M5442 Lumbago with sciatica, left side: Secondary | ICD-10-CM | POA: Diagnosis not present

## 2024-05-10 ENCOUNTER — Ambulatory Visit
Admission: RE | Admit: 2024-05-10 | Discharge: 2024-05-10 | Disposition: A | Discharge status: Home / Self Care | Source: Ambulatory Visit | Attending: Internal Medicine | Admitting: Internal Medicine

## 2024-05-10 ENCOUNTER — Ambulatory Visit: Payer: Medicare HMO

## 2024-05-10 DIAGNOSIS — Z1231 Encounter for screening mammogram for malignant neoplasm of breast: Secondary | ICD-10-CM

## 2024-05-10 DIAGNOSIS — I441 Atrioventricular block, second degree: Secondary | ICD-10-CM

## 2024-05-11 LAB — CUP PACEART REMOTE DEVICE CHECK
Battery Remaining Longevity: 13 mo
Battery Remaining Percentage: 12 %
Battery Voltage: 2.86 V
Brady Statistic AP VP Percent: 64 %
Brady Statistic AP VS Percent: 1 %
Brady Statistic AS VP Percent: 36 %
Brady Statistic AS VS Percent: 1 %
Brady Statistic RA Percent Paced: 62 %
Brady Statistic RV Percent Paced: 99 %
Date Time Interrogation Session: 20251008020031
Implantable Lead Connection Status: 753985
Implantable Lead Connection Status: 753985
Implantable Lead Implant Date: 20190121
Implantable Lead Implant Date: 20190121
Implantable Lead Location: 753859
Implantable Lead Location: 753860
Implantable Pulse Generator Implant Date: 20170928
Lead Channel Impedance Value: 430 Ohm
Lead Channel Impedance Value: 440 Ohm
Lead Channel Pacing Threshold Amplitude: 0.5 V
Lead Channel Pacing Threshold Amplitude: 1.375 V
Lead Channel Pacing Threshold Pulse Width: 0.5 ms
Lead Channel Pacing Threshold Pulse Width: 0.5 ms
Lead Channel Sensing Intrinsic Amplitude: 1.9 mV
Lead Channel Sensing Intrinsic Amplitude: 12 mV
Lead Channel Setting Pacing Amplitude: 1.625
Lead Channel Setting Pacing Amplitude: 2 V
Lead Channel Setting Pacing Pulse Width: 0.5 ms
Lead Channel Setting Sensing Sensitivity: 4 mV
Pulse Gen Model: 2272
Pulse Gen Serial Number: 7950948

## 2024-05-12 NOTE — Progress Notes (Signed)
 Remote PPM Transmission

## 2024-05-18 ENCOUNTER — Ambulatory Visit: Payer: Self-pay | Admitting: Cardiovascular Disease

## 2024-06-02 DIAGNOSIS — R29898 Other symptoms and signs involving the musculoskeletal system: Secondary | ICD-10-CM | POA: Diagnosis not present

## 2024-06-02 DIAGNOSIS — Z9181 History of falling: Secondary | ICD-10-CM | POA: Diagnosis not present

## 2024-06-02 DIAGNOSIS — R269 Unspecified abnormalities of gait and mobility: Secondary | ICD-10-CM | POA: Diagnosis not present

## 2024-06-02 DIAGNOSIS — M5442 Lumbago with sciatica, left side: Secondary | ICD-10-CM | POA: Diagnosis not present

## 2024-06-02 DIAGNOSIS — I10 Essential (primary) hypertension: Secondary | ICD-10-CM | POA: Diagnosis not present

## 2024-06-16 DIAGNOSIS — Z6841 Body Mass Index (BMI) 40.0 and over, adult: Secondary | ICD-10-CM | POA: Diagnosis not present

## 2024-06-16 DIAGNOSIS — M5416 Radiculopathy, lumbar region: Secondary | ICD-10-CM | POA: Diagnosis not present

## 2024-06-19 DIAGNOSIS — I10 Essential (primary) hypertension: Secondary | ICD-10-CM | POA: Diagnosis not present

## 2024-06-19 DIAGNOSIS — Z23 Encounter for immunization: Secondary | ICD-10-CM | POA: Diagnosis not present

## 2024-06-19 DIAGNOSIS — Z1331 Encounter for screening for depression: Secondary | ICD-10-CM | POA: Diagnosis not present

## 2024-06-19 DIAGNOSIS — E559 Vitamin D deficiency, unspecified: Secondary | ICD-10-CM | POA: Diagnosis not present

## 2024-06-19 DIAGNOSIS — Z0000 Encounter for general adult medical examination without abnormal findings: Secondary | ICD-10-CM | POA: Diagnosis not present

## 2024-06-19 DIAGNOSIS — E039 Hypothyroidism, unspecified: Secondary | ICD-10-CM | POA: Diagnosis not present

## 2024-06-21 ENCOUNTER — Other Ambulatory Visit (HOSPITAL_COMMUNITY): Payer: Self-pay | Admitting: Neurosurgery

## 2024-06-21 DIAGNOSIS — M5416 Radiculopathy, lumbar region: Secondary | ICD-10-CM

## 2024-06-22 DIAGNOSIS — R531 Weakness: Secondary | ICD-10-CM | POA: Diagnosis not present

## 2024-06-22 DIAGNOSIS — R2681 Unsteadiness on feet: Secondary | ICD-10-CM | POA: Diagnosis not present

## 2024-06-22 DIAGNOSIS — M545 Low back pain, unspecified: Secondary | ICD-10-CM | POA: Diagnosis not present

## 2024-06-23 ENCOUNTER — Other Ambulatory Visit (HOSPITAL_COMMUNITY): Payer: Self-pay | Admitting: Surgery

## 2024-06-23 DIAGNOSIS — M5416 Radiculopathy, lumbar region: Secondary | ICD-10-CM

## 2024-06-27 DIAGNOSIS — M545 Low back pain, unspecified: Secondary | ICD-10-CM | POA: Diagnosis not present

## 2024-06-27 DIAGNOSIS — R2681 Unsteadiness on feet: Secondary | ICD-10-CM | POA: Diagnosis not present

## 2024-06-27 DIAGNOSIS — R531 Weakness: Secondary | ICD-10-CM | POA: Diagnosis not present

## 2024-07-11 ENCOUNTER — Ambulatory Visit: Admitting: Physician Assistant

## 2024-07-19 DIAGNOSIS — E7800 Pure hypercholesterolemia, unspecified: Secondary | ICD-10-CM | POA: Diagnosis not present

## 2024-07-28 NOTE — Progress Notes (Incomplete)
" °  Device system confirmed to be MRI conditional, with implant date > 6 weeks ago, and no evidence of abandoned or epicardial leads in review of most recent CXR  Device last cleared by EP Provider: Charlies Arthur 07/28/24  Clearance is good through for 1 year as long as parameters remain stable at time of check. If pt undergoes a cardiac device procedure during that time, they should be re-cleared.   Tachy-therapies to be programmed off if applicable with device back to pre-MRI settings after completion of exam.  [employer] will be present for programming for the MRI.   Rocky Catalan, RT  07/28/2024 10:25 AM     "

## 2024-08-02 ENCOUNTER — Encounter (HOSPITAL_COMMUNITY): Payer: Self-pay

## 2024-08-02 ENCOUNTER — Ambulatory Visit (HOSPITAL_COMMUNITY)

## 2024-08-09 ENCOUNTER — Ambulatory Visit

## 2024-08-09 DIAGNOSIS — I441 Atrioventricular block, second degree: Secondary | ICD-10-CM | POA: Diagnosis not present

## 2024-08-10 LAB — CUP PACEART REMOTE DEVICE CHECK
Battery Remaining Longevity: 12 mo
Battery Remaining Percentage: 11 %
Battery Voltage: 2.84 V
Brady Statistic AP VP Percent: 64 %
Brady Statistic AP VS Percent: 1 %
Brady Statistic AS VP Percent: 36 %
Brady Statistic AS VS Percent: 1 %
Brady Statistic RA Percent Paced: 62 %
Brady Statistic RV Percent Paced: 99 %
Date Time Interrogation Session: 20260107020014
Implantable Lead Connection Status: 753985
Implantable Lead Connection Status: 753985
Implantable Lead Implant Date: 20190121
Implantable Lead Implant Date: 20190121
Implantable Lead Location: 753859
Implantable Lead Location: 753860
Implantable Pulse Generator Implant Date: 20170928
Lead Channel Impedance Value: 430 Ohm
Lead Channel Impedance Value: 440 Ohm
Lead Channel Pacing Threshold Amplitude: 0.5 V
Lead Channel Pacing Threshold Amplitude: 1.25 V
Lead Channel Pacing Threshold Pulse Width: 0.5 ms
Lead Channel Pacing Threshold Pulse Width: 0.5 ms
Lead Channel Sensing Intrinsic Amplitude: 1.3 mV
Lead Channel Sensing Intrinsic Amplitude: 12 mV
Lead Channel Setting Pacing Amplitude: 1.5 V
Lead Channel Setting Pacing Amplitude: 2 V
Lead Channel Setting Pacing Pulse Width: 0.5 ms
Lead Channel Setting Sensing Sensitivity: 4 mV
Pulse Gen Model: 2272
Pulse Gen Serial Number: 7950948

## 2024-08-12 ENCOUNTER — Ambulatory Visit: Payer: Self-pay | Admitting: Cardiovascular Disease

## 2024-08-14 NOTE — Progress Notes (Signed)
 Remote PPM Transmission

## 2024-11-08 ENCOUNTER — Encounter

## 2025-02-07 ENCOUNTER — Encounter

## 2025-05-09 ENCOUNTER — Encounter

## 2025-08-08 ENCOUNTER — Encounter

## 2025-11-07 ENCOUNTER — Encounter
